# Patient Record
Sex: Female | Born: 1940 | Race: White | Hispanic: No | Smoking: Never smoker
Health system: Southern US, Community
[De-identification: ages and names within clinical notes are randomized; demographics above are authoritative.]

## PROBLEM LIST (undated history)

## (undated) DIAGNOSIS — K900 Celiac disease: Secondary | ICD-10-CM

## (undated) DIAGNOSIS — E785 Hyperlipidemia, unspecified: Secondary | ICD-10-CM

## (undated) DIAGNOSIS — M1990 Unspecified osteoarthritis, unspecified site: Secondary | ICD-10-CM

## (undated) DIAGNOSIS — Z9289 Personal history of other medical treatment: Secondary | ICD-10-CM

## (undated) DIAGNOSIS — D649 Anemia, unspecified: Secondary | ICD-10-CM

## (undated) DIAGNOSIS — T7840XA Allergy, unspecified, initial encounter: Secondary | ICD-10-CM

## (undated) DIAGNOSIS — B029 Zoster without complications: Secondary | ICD-10-CM

## (undated) DIAGNOSIS — E039 Hypothyroidism, unspecified: Secondary | ICD-10-CM

## (undated) DIAGNOSIS — L309 Dermatitis, unspecified: Secondary | ICD-10-CM

## (undated) DIAGNOSIS — I480 Paroxysmal atrial fibrillation: Secondary | ICD-10-CM

## (undated) DIAGNOSIS — G4733 Obstructive sleep apnea (adult) (pediatric): Secondary | ICD-10-CM

## (undated) DIAGNOSIS — H269 Unspecified cataract: Secondary | ICD-10-CM

## (undated) DIAGNOSIS — M8580 Other specified disorders of bone density and structure, unspecified site: Secondary | ICD-10-CM

## (undated) HISTORY — DX: Celiac disease: K90.0

## (undated) HISTORY — DX: Unspecified osteoarthritis, unspecified site: M19.90

## (undated) HISTORY — DX: Zoster without complications: B02.9

## (undated) HISTORY — DX: Unspecified cataract: H26.9

## (undated) HISTORY — DX: Dermatitis, unspecified: L30.9

## (undated) HISTORY — PX: UPPER GI ENDOSCOPY: SHX6162

## (undated) HISTORY — PX: COLONOSCOPY: SHX174

## (undated) HISTORY — PX: DILATION AND CURETTAGE OF UTERUS: SHX78

## (undated) HISTORY — DX: Anemia, unspecified: D64.9

## (undated) HISTORY — DX: Allergy, unspecified, initial encounter: T78.40XA

## (undated) HISTORY — DX: Hyperlipidemia, unspecified: E78.5

## (undated) HISTORY — DX: Other specified disorders of bone density and structure, unspecified site: M85.80

## (undated) HISTORY — DX: Personal history of other medical treatment: Z92.89

## (undated) HISTORY — DX: Hypothyroidism, unspecified: E03.9

## (undated) HISTORY — PX: CATARACT EXTRACTION: SUR2

---

## 2004-03-19 ENCOUNTER — Encounter: Payer: Self-pay | Admitting: Internal Medicine

## 2004-12-22 ENCOUNTER — Encounter: Admission: RE | Admit: 2004-12-22 | Discharge: 2004-12-22 | Payer: Self-pay | Admitting: Internal Medicine

## 2009-04-28 ENCOUNTER — Encounter (INDEPENDENT_AMBULATORY_CARE_PROVIDER_SITE_OTHER): Payer: Self-pay | Admitting: *Deleted

## 2009-05-18 ENCOUNTER — Ambulatory Visit: Payer: Self-pay | Admitting: Internal Medicine

## 2009-05-18 DIAGNOSIS — R143 Flatulence: Secondary | ICD-10-CM

## 2009-05-18 DIAGNOSIS — R141 Gas pain: Secondary | ICD-10-CM | POA: Insufficient documentation

## 2009-05-18 DIAGNOSIS — K5900 Constipation, unspecified: Secondary | ICD-10-CM | POA: Insufficient documentation

## 2009-05-18 DIAGNOSIS — R142 Eructation: Secondary | ICD-10-CM

## 2009-05-21 LAB — CONVERTED CEMR LAB: Tissue Transglutaminase Ab, IgA: 176 units — ABNORMAL HIGH (ref <7)

## 2009-05-22 ENCOUNTER — Ambulatory Visit: Payer: Self-pay | Admitting: Internal Medicine

## 2009-05-22 ENCOUNTER — Encounter (INDEPENDENT_AMBULATORY_CARE_PROVIDER_SITE_OTHER): Payer: Self-pay | Admitting: *Deleted

## 2009-05-28 ENCOUNTER — Ambulatory Visit: Payer: Self-pay | Admitting: Internal Medicine

## 2009-05-29 ENCOUNTER — Encounter: Payer: Self-pay | Admitting: Internal Medicine

## 2009-06-01 ENCOUNTER — Telehealth: Payer: Self-pay | Admitting: Internal Medicine

## 2009-06-04 ENCOUNTER — Telehealth: Payer: Self-pay | Admitting: Internal Medicine

## 2009-06-15 ENCOUNTER — Telehealth: Payer: Self-pay | Admitting: Internal Medicine

## 2009-06-16 ENCOUNTER — Telehealth (INDEPENDENT_AMBULATORY_CARE_PROVIDER_SITE_OTHER): Payer: Self-pay | Admitting: *Deleted

## 2009-06-29 ENCOUNTER — Ambulatory Visit: Payer: Self-pay | Admitting: Internal Medicine

## 2009-06-29 DIAGNOSIS — K900 Celiac disease: Secondary | ICD-10-CM | POA: Insufficient documentation

## 2009-08-11 ENCOUNTER — Encounter: Payer: Self-pay | Admitting: Internal Medicine

## 2009-08-11 ENCOUNTER — Encounter: Admission: RE | Admit: 2009-08-11 | Discharge: 2009-08-11 | Payer: Self-pay | Admitting: Internal Medicine

## 2009-12-30 ENCOUNTER — Ambulatory Visit: Payer: Self-pay | Admitting: Internal Medicine

## 2010-01-04 LAB — CONVERTED CEMR LAB: Tissue Transglutaminase Ab, IgA: 21.8 units — ABNORMAL HIGH (ref <20)

## 2010-03-03 ENCOUNTER — Encounter: Payer: Self-pay | Admitting: Nurse Practitioner

## 2010-03-15 ENCOUNTER — Encounter: Payer: Self-pay | Admitting: Nurse Practitioner

## 2010-03-17 ENCOUNTER — Encounter: Payer: Self-pay | Admitting: Nurse Practitioner

## 2010-03-17 ENCOUNTER — Encounter
Admission: RE | Admit: 2010-03-17 | Discharge: 2010-03-17 | Payer: Self-pay | Source: Home / Self Care | Attending: Internal Medicine | Admitting: Internal Medicine

## 2010-03-22 ENCOUNTER — Encounter: Payer: Self-pay | Admitting: Nurse Practitioner

## 2010-03-23 ENCOUNTER — Telehealth (INDEPENDENT_AMBULATORY_CARE_PROVIDER_SITE_OTHER): Payer: Self-pay | Admitting: *Deleted

## 2010-03-23 ENCOUNTER — Ambulatory Visit: Payer: Self-pay | Admitting: Gastroenterology

## 2010-03-23 DIAGNOSIS — R1031 Right lower quadrant pain: Secondary | ICD-10-CM | POA: Insufficient documentation

## 2010-03-23 LAB — CONVERTED CEMR LAB
BUN: 11 mg/dL (ref 6–23)
CO2: 30 meq/L (ref 19–32)
Calcium: 9.6 mg/dL (ref 8.4–10.5)
Chloride: 101 meq/L (ref 96–112)
Creatinine, Ser: 0.7 mg/dL (ref 0.4–1.2)
GFR calc non Af Amer: 89.42 mL/min (ref >60.00)
Glucose, Bld: 86 mg/dL (ref 70–99)
Potassium: 4.8 meq/L (ref 3.5–5.1)
Sodium: 137 meq/L (ref 135–145)

## 2010-03-24 ENCOUNTER — Ambulatory Visit: Payer: Self-pay | Admitting: Internal Medicine

## 2010-03-25 ENCOUNTER — Telehealth: Payer: Self-pay | Admitting: Gastroenterology

## 2010-04-15 ENCOUNTER — Emergency Department (HOSPITAL_COMMUNITY)
Admission: EM | Admit: 2010-04-15 | Discharge: 2010-04-15 | Payer: Self-pay | Source: Home / Self Care | Admitting: Emergency Medicine

## 2010-04-19 LAB — URINALYSIS, ROUTINE W REFLEX MICROSCOPIC
Bilirubin Urine: NEGATIVE
Hgb urine dipstick: NEGATIVE
Ketones, ur: NEGATIVE mg/dL
Nitrite: NEGATIVE
Protein, ur: NEGATIVE mg/dL
Specific Gravity, Urine: 1.008 (ref 1.005–1.030)
Urine Glucose, Fasting: NEGATIVE mg/dL
Urobilinogen, UA: 0.2 mg/dL (ref 0.0–1.0)
pH: 6 (ref 5.0–8.0)

## 2010-04-19 LAB — POCT I-STAT, CHEM 8
BUN: 12 mg/dL (ref 6–23)
Calcium, Ion: 1.17 mmol/L (ref 1.12–1.32)
Chloride: 102 mEq/L (ref 96–112)
Creatinine, Ser: 0.9 mg/dL (ref 0.4–1.2)
Glucose, Bld: 93 mg/dL (ref 70–99)
HCT: 41 % (ref 36.0–46.0)
Hemoglobin: 13.9 g/dL (ref 12.0–15.0)
Potassium: 4.1 mEq/L (ref 3.5–5.1)
Sodium: 135 mEq/L (ref 135–145)
TCO2: 25 mmol/L (ref 0–100)

## 2010-04-26 ENCOUNTER — Ambulatory Visit
Admission: RE | Admit: 2010-04-26 | Discharge: 2010-04-26 | Payer: Self-pay | Source: Home / Self Care | Attending: Internal Medicine | Admitting: Internal Medicine

## 2010-05-04 NOTE — Progress Notes (Signed)
Summary: biopsy results wanted  Phone Note Call from Patient Call back at Home Phone [phone]   Caller: Patient Call For: Dr. Henrene Pastor Reason for Call: Lab or Test Results Summary of Call: pt would like biopsy results  Initial call taken by: Lucien Mons,  June 01, 2009 8:47 AM  Follow-up for Phone Call        This note has been flagged for [facility] to call the paatient.  I do believe that the patient should have one nurse explain Sprue to her.  I will end this note so Malachy Mood can write her report.  I will also call the patient and tell her that she will get a call from one of the nurses downstairs in the very near future. Follow-up by: Ernestine Conrad RN,  June 01, 2009 11:32 AM

## 2010-05-04 NOTE — Procedures (Signed)
Summary: [facility]  [facility]   Imported By: Lester Fitzgerald 05/22/2009 10:53:48  _____________________________________________________________________  External Attachment:    Type:   Image     Comment:   External Document

## 2010-05-04 NOTE — Letter (Signed)
Summary: [facility]  [facility]   Imported By: Bubba Hales 08/24/2009 10:17:44  _____________________________________________________________________  External Attachment:    Type:   Image     Comment:   External Document

## 2010-05-04 NOTE — Letter (Signed)
Summary: EGD Instructions  [facility]  [address] [place], [place] [address]   Phone: [phone]  Fax: [phone]       Susan Bell    08/15/1940    MRN: [id number]       Procedure Day Dorna Bloom:  Lenor Coffin  05/28/09     Arrival Time:   8:00AM     Procedure Time:  9:00AM     Location of Procedure:                    _ X _  [facility]oscopy Center (4th Floor)   PREPARATION FOR ENDOSCOPY   On 05/28/09 THE DAY OF THE PROCEDURE:  1.   No solid foods, milk or milk products are allowed after midnight the night before your procedure.  2.   Do not drink anything colored red or purple.  Avoid juices with pulp.  No orange juice.  3.  You may drink clear liquids until 7:00AM, which is 2 hours before your procedure.                                                                                                CLEAR LIQUIDS INCLUDE: Water Jello Ice Popsicles Tea (sugar ok, no milk/cream) Powdered fruit flavored drinks Coffee (sugar ok, no milk/cream) Gatorade Juice: apple, white grape, white cranberry  Lemonade Clear bullion, consomm, broth Carbonated beverages (any kind) Strained chicken noodle soup Hard Candy   MEDICATION INSTRUCTIONS  Unless otherwise instructed, you should take regular prescription medications with a small sip of water as early as possible the morning of your procedure.  Diabetic patients - see separate instructions.  Stop taking Plavix or Aggrenox on  _  (7 days before procedure).     Stop taking Coumadin on  _  (5 days before procedure).  Additional medication instructions: _             OTHER INSTRUCTIONS  You will need a responsible adult at least 70 years of age to accompany you and drive you home.   This person must remain in the waiting room during your procedure.  Wear loose fitting clothing that is easily removed.  Leave jewelry and other valuables at home.  However, you may wish to bring a book to read or an  iPod/MP3 player to listen to music as you wait for your procedure to start.  Remove all body piercing jewelry and leave at home.  Total time from sign-in until discharge is approximately 2-3 hours.  You should go home directly after your procedure and rest.  You can resume normal activities the day after your procedure.  The day of your procedure you should not:   Drive   Make legal decisions   Operate machinery   Drink alcohol   Return to work  You will receive specific instructions about eating, activities and medications before you leave.    The above instructions have been reviewed and explained to me by   _______________________    I fully understand and can verbalize these instructions _____________________________ Date _________

## 2010-05-04 NOTE — Assessment & Plan Note (Signed)
Summary: flatus   History of Present Illness Visit Type: Initial Consult Primary GI MD: Scarlette Shorts MD Primary Provider: Shon Baton, MD Requesting Provider: Shon Baton, MD Chief Complaint: Gas, bloating for 6-9 months; constipation on going because of iron intake History of Present Illness:   70 year old white female with a history of hyperlipidemia, hypothyroidism, and osteoporosis. She is sent today with the chief complaint is increased intestinal gas as manifested principally by flatus. The problem has been going on for about 6-9 months. She states that it is getting worse. She cannot identify any factors that exacerbate or relieve the problem. Her GI review of systems is remarkable for chronic constipation which she treats with daily Metamucil and high-fiber foods. She has done this for years. She has tried a probiotic for a few weeks without improvement. She denies belching, abdominal pain, change in bowel habits, weight loss, or bleeding. Her medications are stable. She does bring with her a prior colonoscopy report from [place]. I have reviewed the report. She had a complete colonoscopy that was normal except for small internal hemorrhoids. She denies a family history of colon cancer or colon polyps. Review of outside laboratories from Dr. Keane Police office reveals negative Hemoccults April 28, 2009. Blood work from the week previous shows normal hemoglobin of 12.2.. She also requires about the timing of a followup colonoscopy.   GI Review of Systems    Reports bloating.      Denies abdominal pain, acid reflux, belching, chest pain, dysphagia with liquids, dysphagia with solids, heartburn, loss of appetite, nausea, vomiting, vomiting blood, weight loss, and  weight gain.      Reports constipation.     Denies anal fissure, black tarry stools, change in bowel habit, diarrhea, diverticulosis, fecal incontinence, heme positive stool, hemorrhoids, irritable bowel syndrome, jaundice, light color  stool, liver problems, rectal bleeding, and  rectal pain. Preventive Screening-Counseling & Management  Alcohol-Tobacco     Smoking Status: never      Drug Use:  no.      Current Medications (verified): 1)  Aspir-Low 81 Mg Tbec (Aspirin) .Marland Kitchen.. 1 By Mouth Three Days Per Week 2)  Calcium-Vitamin D 250-125 Mg-Unit Tabs (Calcium Carbonate-Vitamin D) .Marland Kitchen.. 1 By Mouth Once Daily 3)  Fosamax 70 Mg Tabs (Alendronate Sodium) .Marland Kitchen.. 1 Time Per Week 4)  Levothroid 88 Mcg Tabs (Levothyroxine Sodium) .Marland Kitchen.. 1 By Mouth Once Daily 5)  Lipitor 10 Mg Tabs (Atorvastatin Calcium) .... 1/2 By Mouth Once Daily 6)  Fish Oil 1000 Mg Caps (Omega-3 Fatty Acids) .... 2 By Mouth Once Daily 7)  Pro-Biotic Blend  Caps (Probiotic Product) .... Once Daily  Allergies (verified): No Known Drug Allergies  Past History:  Past Medical History: Reviewed history from 05/15/2009 and no changes required. Hypothyroidism Eczema Hyperlipidemia Osteopenia Vit D Deficiency Anemia Arthritis(osteo) Knee Shingles  Past Surgical History: Cataract Extraction (bilateral) D & C  Radiation Ablation Iodine 2005(pill)  Family History: Family History of Liver Cancer:Mother Family History of Diabetes: Father Family History of Heart Disease:Father   Social History: Occupation: Retired Patient has never smoked.  Alcohol Use - no Daily Caffeine Use -5 Illicit Drug Use - no Smoking Status:  never Drug Use:  no  Review of Systems  The patient denies allergy/sinus, anemia, anxiety-new, arthritis/joint pain, back pain, blood in urine, breast changes/lumps, change in vision, confusion, cough, coughing up blood, depression-new, fainting, fatigue, fever, headaches-new, hearing problems, heart murmur, heart rhythm changes, itching, menstrual pain, muscle pains/cramps, night sweats, nosebleeds, pregnancy symptoms, shortness of  breath, skin rash, sleeping problems, sore throat, swelling of feet/legs, swollen lymph glands, thirst -  excessive , urination - excessive , urination changes/pain, urine leakage, vision changes, and voice change.    Vital Signs:  Patient profile:   70 year old female Height:      65.5 inches Weight:      157.13 pounds BMI:     25.84 Pulse rate:   72 / minute Pulse rhythm:   regular BP sitting:   110 / 68  (left arm) Cuff size:   regular  Vitals Entered By: June McMurray [place] Deborra Medina) (May 18, 2009 10:17 AM)  Physical Exam  General:  Well developed, well nourished, no acute distress. Head:  Normocephalic and atraumatic. Eyes:  PERRLA, no icterus. Ears:  Normal auditory acuity. Nose:  No deformity, discharge,  or lesions. Mouth:  No deformity or lesions, dentition normal. Neck:  Supple; no masses or thyromegaly. Lungs:  Clear throughout to auscultation. Heart:  Regular rate and rhythm; no murmurs, rubs,  or bruits. Abdomen:  Soft, nontender and nondistended. No masses, hepatosplenomegaly or hernias noted. Normal bowel sounds. Msk:  Symmetrical with no gross deformities. Normal posture. Pulses:  Normal pulses noted. Extremities:  No clubbing, cyanosis, edema or deformities noted. Neurologic:  Alert and  oriented x4;  grossly normal neurologically. Skin:  Intact without significant lesions or rashes. Cervical Nodes:  No significant cervical adenopathy. No supraclavicular adenopathy Psych:  Alert and cooperative. Normal mood and affect.   Impression & Recommendations:  Problem # 1:  FLATULENCE-GAS-BLOATING (ICD-787.3) 6-9 month history of increased flatus. No accompanying alarm symptoms. Negative colonoscopy in 2005. We had a long discussion today on the etiology of intestinal gas.  Plan: #1. Stop Metamucil as this may increase intestinal gas #2. Obtain tissue transglutaminase antibody to screen for sprue #3. Empiric trial of broad-spectrum antibiotic for possible bacterial overgrowth. Metronidazole 250 mg q.i.d. x10 days prescribed #4. Brochure on intestinal gas reviewed and  provided per patient review #5. Brochure on anti-gas and flatulence dietary measures provided for her review. #6. Office followup in 6 weeks  Problem # 2:  CONSTIPATION (ICD-564.00) chronic stable functional constipation.  Plan: #1. Prescribe MiraLax. Titrat to need.  Problem # 3:  SCREENING COLORECTAL-CANCER (ICD-V76.51) index exam in 2005 was normal. Based on current guidelines (which I reviewed with her), routine followup would be recommended 2015. Colonoscopy recall to be made for that time.  Other Orders: T-Tissue Transglutamase Ab IgA [phone])  Patient Instructions: 1)  Flagyl 250 mg 1 by mouth qid x 10 days Rx. sent eletronically to patient's pharmacy for patient to pick up. 2)  TTG ordered for patient to go to basement and have drawn today. 3)  Please schedule a follow-up appointment in 6  weeks.  4)  Gas brochure and prevention diet given to patient. 5)  Stop Metamucil and start Miralax dailty for constipation.  17 grams in 8 oz water daily.   6)  The medication list was reviewed and reconciled.  All changed / newly prescribed medications were explained.  A complete medication list was provided to the patient / caregiver. 7)  Copy: Dr. Shon Baton Prescriptions: METRONIDAZOLE 250 MG TABS (METRONIDAZOLE) 1 tablet by mouth qid x 10 days.  #40 x 0   Entered by:   Randye Lobo NCMA   Authorized by:   Irene Shipper MD   Signed by:   Randye Lobo NCMA on 05/18/2009   Method used:   Electronically to  [facility]  [phone]* (retail)       [address]       [place], [place]  [address]       Ph: [phone] or [phone]       Fax: [phone]   RxID:   [id number]

## 2010-05-04 NOTE — Letter (Signed)
Summary: New Patient letter  [facility]  [address] [place], [place] [address]   Phone: [phone]  Fax: [phone]       04/28/2009 MRN: [id number]  [name] [address] [place], [place]  [address]  Dear Susan Bell,  Welcome to the Gastroenterology Division at [facility].    You are scheduled to see Dr.  Yancey Flemings on June 01, 2009 at 9:15am on the 3rd floor at [facility], [address]  We ask that you try to arrive at our office 15 minutes prior to your appointment time to allow for check-in.  We would like you to complete the enclosed self-administered evaluation form prior to your visit and bring it with you on the day of your appointment.  We will review it with you.  Also, please bring a complete list of all your medications or, if you prefer, bring the medication bottles and we will list them.  Please bring your insurance card so that we may make a copy of it.  If your insurance requires a referral to see a specialist, please bring your referral form from your primary care physician.  Co-payments are due at the time of your visit and may be paid by cash, check or credit card.     Your office visit will consist of a consult with your physician (includes a physical exam), any laboratory testing he/she may order, scheduling of any necessary diagnostic testing (e.g. x-ray, ultrasound, CT-scan), and scheduling of a procedure (e.g. Endoscopy, Colonoscopy) if required.  Please allow enough time on your schedule to allow for any/all of these possibilities.    If you cannot keep your appointment, please call [phone] to cancel or reschedule prior to your appointment date.  This allows [place] the opportunity to schedule an appointment for another patient in need of care.  If you do not cancel or reschedule by 5 p.m. the business day prior to your appointment date, you will be charged a $50.00 late cancellation/no-show fee.    Thank you  for choosing [facility] for your medical needs.  We appreciate the opportunity to care for you.  Please visit [place] at our website  to learn more about our practice.                     Sincerely,                                                             The Gastroenterology Division

## 2010-05-04 NOTE — Progress Notes (Signed)
Summary: update  Phone Note Call from Patient Call back at Home Phone [phone]   Caller: Patient Call For: Dr. Marina Goodell Reason for Call: Talk to Nurse Summary of Call: pt reporting that she does not have an appt with the nutritionist yet Initial call taken by: Vallarie Mare,  June 15, 2009 2:51 PM  Follow-up for Phone Call          Out-pt.nutrition dept. contacted and they will call her now as they said she did not answer previously. Follow-up by: Teryl Lucy RN,  June 15, 2009 2:58 PM

## 2010-05-04 NOTE — Assessment & Plan Note (Signed)
Summary: F/U APPT - new diagnosis Celiac Sprue   History of Present Illness Visit Type: Follow-up Visit Primary GI MD: Yancey Flemings MD Primary Provider: Creola Corn, MD Requesting Provider: n/a Chief Complaint: F/u from endo. Pt denies any GI complaints  History of Present Illness:   70 year old female with hypertension, hyperlipidemia, and osteoporosis. She was evaluated about 6 weeks ago with a chief complaint of increased flatus. See that dictation for details. Tissue transglutaminase antibody returned positive. On May 28, 2009 she underwent upper endoscopy. She was found to have esophageal ring and atrophic gastric mucosa. Duodenal biopsies were obtained and revealed partial villous atrophy and chronic inflammation consistent with celiac sprue. We discussed these results by telephone. She has been avoiding gluten to the best of her ability. Her GI symptoms have improved.her only ongoing complaint is that of chronic constipation for which he uses MiraLax p.r.n.   GI Review of Systems      Denies abdominal pain, acid reflux, belching, bloating, chest pain, dysphagia with liquids, dysphagia with solids, heartburn, loss of appetite, nausea, vomiting, vomiting blood, weight loss, and  weight gain.      Reports constipation.     Denies anal fissure, black tarry stools, change in bowel habit, diarrhea, diverticulosis, fecal incontinence, heme positive stool, hemorrhoids, irritable bowel syndrome, jaundice, light color stool, liver problems, rectal bleeding, and  rectal pain.    Current Medications (verified): 1)  Aspir-Low 81 Mg Tbec (Aspirin) .Marland Kitchen.. 1 By Mouth Three Days Per Week 2)  Calcium-Vitamin D 600-125 Mg-Unit Tabs (Calcium-Vitamin D) .... One Tablet By Mouth Once Daily 3)  Fosamax 70 Mg Tabs (Alendronate Sodium) .Marland Kitchen.. 1 Time Per Week 4)  Levothroid 88 Mcg Tabs (Levothyroxine Sodium) .Marland Kitchen.. 1 By Mouth Once Daily 5)  Lipitor 10 Mg Tabs (Atorvastatin Calcium) .... 1/2 By Mouth Once  Daily 6)  Fish Oil 1000 Mg Caps (Omega-3 Fatty Acids) .... 2 By Mouth Once Daily  Allergies (verified): No Known Drug Allergies  Past History:  Past Medical History: Hypothyroidism Eczema Hyperlipidemia Osteopenia Vit D Deficiency Anemia Arthritis(osteo) Knee Shingles Celiac Disease   Past Surgical History: Reviewed history from 05/18/2009 and no changes required. Cataract Extraction (bilateral) D & C  Radiation Ablation Iodine 2005(pill)  Family History: Family History of Liver Cancer:Mother Family History of Diabetes: Father Family History of Heart Disease:Father  No FH of Colon Cancer:  Social History: Reviewed history from 05/18/2009 and no changes required. Occupation: Retired Patient has never smoked.  Alcohol Use - no Daily Caffeine Use -5 Illicit Drug Use - no  Review of Systems  The patient denies allergy/sinus, anemia, anxiety-new, arthritis/joint pain, back pain, blood in urine, breast changes/lumps, change in vision, confusion, cough, coughing up blood, depression-new, fainting, fatigue, fever, headaches-new, hearing problems, heart murmur, heart rhythm changes, itching, menstrual pain, muscle pains/cramps, night sweats, nosebleeds, pregnancy symptoms, shortness of breath, skin rash, sleeping problems, sore throat, swelling of feet/legs, swollen lymph glands, thirst - excessive , urination - excessive , urination changes/pain, urine leakage, vision changes, and voice change.    Vital Signs:  Patient profile:   70 year old female Height:      66.5 inches Weight:      155 pounds BMI:     24.73 BSA:     1.81 Pulse rate:   68 / minute Pulse rhythm:   regular BP sitting:   110 / 60  (left arm) Cuff size:   regular  Vitals Entered By: Ok Anis CMA (June 29, 2009 10:11 AM)  Physical Exam  General:  Well developed, well nourished, no acute distress. Eyes:  anicteric Abdomen:  not reexamined Neurologic:  Alert and  oriented x4;   Psych:  Alert and  cooperative. Normal mood and affect.   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 We discussed the pathophysiology of the condition as well as potential complications without proper therapy. Therapy, of course, is gluten avoidance. She had multiple questions which were answered to her satisfaction.  Plan #1. Referred to nutritional specialist regarding gluten free diet. Appointment in May #2. Refer to educational website #3. Followup tissue transglutaminase antibody in 6 months #4. Routine GI followup in one year  Problem # 2:  CONSTIPATION (ICD-564.00) MiraLax p.r.n.  Problem # 3:  SCREENING COLORECTAL-CANCER (ICD-V76.51) followup screening in 2015. Recall made.  Patient Instructions: 1)  TTG Ab placed in IDX for patient to have drawn in 6 months.  2)  I will call and remind you 1 week prior that it is time. 3)  Please schedule a follow-up appointment in 1 year. 4)  The medication list was reviewed and reconciled.  All changed / newly prescribed medications were explained.  A complete medication list was provided to the patient / caregiver. 5)  copy printed and given to patient.  Milford Cage [facility]  June 29, 2009 10:47 AM 6)  Copy: Dr. Creola Corn

## 2010-05-04 NOTE — Progress Notes (Signed)
  Phone Note Outgoing Call   Summary of Call:  Checked with pt. and nutrtion appt. made for 08/11/2009. Initial call taken by: Teryl Lucy RN,  June 16, 2009 8:51 AM

## 2010-05-04 NOTE — Procedures (Signed)
Summary: Upper Endoscopy  Patient: Susan Bell Note: All result statuses are Final unless otherwise noted.  Tests: (1) Upper Endoscopy (EGD)   EGD Upper Endoscopy       DONE     [facility]     [address]     [place], [place]  [address]           ENDOSCOPY PROCEDURE REPORT           PATIENT:  Susan, Bell  MR#:  [id number]     BIRTHDATE:  January 05, 1941, 69 yrs. old  GENDER:  female           ENDOSCOPIST:  Wilhemina Bonito. Eda Keys, MD     Referred by:  Creola Corn, M.D.           PROCEDURE DATE:  05/28/2009     PROCEDURE:  EGD with biopsy     ASA CLASS:  Class II     INDICATIONS:  small bowel biopsy to rule out celiac sprue     (positive TTG ab)           MEDICATIONS:   Fentanyl 75 mcg IV, Versed 4 mg IV     TOPICAL ANESTHETIC:  Exactacain Spray           DESCRIPTION OF PROCEDURE:   After the risks benefits and     alternatives of the procedure were thoroughly explained, informed     consent was obtained.  The LB [id number] endoscope was     introduced through the mouth and advanced to the second portion of     the duodenum, without limitations.  The instrument was slowly     withdrawn as the mucosa was fully examined.     <<PROCEDUREIMAGES>>           An incidental large caliber esophageal ring was found at the GEJ.     Atrophic gastric mucosa throughout the stomach.  The bulb was     normal. The second portion of the duodenum revealed no gross     abnormality. Mult bx D2/D3 taken. Retroflexed views revealed no     abnormalities.    The scope was then withdrawn from the patient     and the procedure completed.           COMPLICATIONS:  None           ENDOSCOPIC IMPRESSION:     1) Ring, esophageal     2) Atrophic gastric mucosa     RECOMMENDATIONS:     1) Await pathology results           REPEAT EXAM:  No           ______________________________     Wilhemina Bonito. Eda Keys, MD           CC:  Creola Corn, MD, The Patient           n.     eSIGNED:   Wilhemina Bonito. Eda Keys at 05/28/2009 10:08 AM           Rometta Emery, [id number]  Note: An exclamation mark (!) indicates a result that was not dispersed into the flowsheet. Document Creation Date: 05/28/2009 10:36 AM _______________________________________________________________________  (1) Order result status: Final Collection or observation date-time: 05/28/2009 09:55 Requested date-time:  Receipt date-time:  Reported date-time:  Referring Physician:   Ordering Physician: Fransico Setters [phone]) Specimen Source:  Source: Launa Grill Order Number: [id number] Lab site:

## 2010-05-04 NOTE — Miscellaneous (Signed)
Summary: [facility] Previsit/prep  Clinical Lists Changes  Observations: Added new observation of NKA: T (05/22/2009 13:23)

## 2010-05-04 NOTE — Letter (Signed)
Summary: Patient Notice-Endo Biopsy Results  [facility]  [address] [place], [place] [address]   Phone: [phone]  Fax: [phone]        May 29, 2009 MRN: [id number]    Susan Bell [address] [place], [place]  [address]    Dear [name],  The biopsies taken during your recent endoscopic examination did show histologic changes consistent with CELIAC SPRUE.  Additional information/recommendations:  1. MY NURSE WILL ARRANGE FOR YOU TO SEE A NUTRITIONIST REGARDING A GLUTEN FREE DIET  2. PLEASE VISIT OUR GI WEB SITE [email or web address] UNDER COMMON CODITIONS AND CLICK ON CELIAC SPRUE AS A REFERENCE  3. KEEP YOUR ALREADY SCHEDULED APPOINTMENT WITH ME FOR NEXT MONTH   Please call us if you are having persistent problems or have questions about your condition that have not been fully answered at this time.  Sincerely,  Hilarie Fredrickson MD  This letter has been electronically signed by your physician.  Appended Document: Patient Notice-Endo Biopsy Results Letter mailed 3.1.11

## 2010-05-04 NOTE — Progress Notes (Signed)
Summary: Triage  Phone Note Call from Patient Call back at Home Phone [phone]   Caller: Patient Call For: Dr. Marina Goodell Reason for Call: Talk to Nurse Summary of Call: Pt is calling about the appt. for a nutritionist Initial call taken by: Karna Christmas,  June 04, 2009 9:57 AM  Follow-up for Phone Call         Pt. informed that referral was sent on this past Monday when I talked with her and it usually takes several days but she will be contacted as soon as we are ntfd. of appt. date. Follow-up by: Teryl Lucy RN,  June 04, 2009 10:40 AM

## 2010-05-06 NOTE — Letter (Signed)
Summary: Phone Note/[facility]  Phone Note/[facility]   Imported By: Sherian Rein 04/08/2010 15:11:51  _____________________________________________________________________  External Attachment:    Type:   Image     Comment:   External Document

## 2010-05-06 NOTE — Assessment & Plan Note (Signed)
Summary: F/U RLQ pain, Review CT scan (saw Lafe Garin ACNP)   History of Present Illness Visit Type: Follow-up Visit Primary GI MD: Yancey Flemings MD Primary Provider: Creola Corn, MD Requesting Provider: n/a Chief Complaint: RLQ pain, review CT findings History of Present Illness:   70 year old female with hypothyroidism, hyperlipidemia, and celiac sprue. She was seen in the office by our extender March 23, 2010 regarding sharp intermittent right lower quadrant pain. She has had this problem for over 6 months. Abdominal ultrasound and CT scan of the abdomen were unremarkable. Increased stool burden on CT, the patient has normal bowel movements twice daily. Empiric MiraLax to increased frequency of bowel movements did not change pain. She continues to describe sharp pain in the region of the right hip anteriorly without radiation. More noticeable at night when lying on the left or right side. Improved with lying on her back. Sometimes notices this during the day. Does not inhibit activities. No associated GI symptoms. She is accompanied by her husband.   GI Review of Systems    Reports abdominal pain.     Location of  Abdominal pain: RLQ.    Denies acid reflux, belching, bloating, chest pain, dysphagia with liquids, dysphagia with solids, heartburn, loss of appetite, nausea, vomiting, vomiting blood, weight loss, and  weight gain.        Denies anal fissure, black tarry stools, change in bowel habit, constipation, diarrhea, diverticulosis, fecal incontinence, heme positive stool, hemorrhoids, irritable bowel syndrome, jaundice, light color stool, liver problems, rectal bleeding, and  rectal pain.    Current Medications (verified): 1)  Calcium-Vitamin D 600-125 Mg-Unit Tabs (Calcium-Vitamin D) .... One Tablet By Mouth Once Daily 2)  Levothroid 88 Mcg Tabs (Levothyroxine Sodium) .Marland Kitchen.. 1 By Mouth Once Daily 3)  Fish Oil 1000 Mg Caps (Omega-3 Fatty Acids) .... 2 By Mouth Once Daily 4)  Meclizine  Hcl 25 Mg Tabs (Meclizine Hcl) .... Take 1 Tablet By Mouth Every 6 Hours For Vertigo  Allergies (verified): No Known Drug Allergies  Past History:  Past Medical History: Reviewed history from 06/29/2009 and no changes required. Hypothyroidism Eczema Hyperlipidemia Osteopenia Vit D Deficiency Anemia Arthritis(osteo) Knee Shingles Celiac Disease   Past Surgical History: Reviewed history from 05/18/2009 and no changes required. Cataract Extraction (bilateral) D & C  Radiation Ablation Iodine 2005(pill)  Family History: Reviewed history from 06/29/2009 and no changes required. Family History of Liver Cancer:Mother Family History of Diabetes: Father Family History of Heart Disease:Father  No FH of Colon Cancer:  Social History: Reviewed history from 05/18/2009 and no changes required. Occupation: Retired Patient has never smoked.  Alcohol Use - no Daily Caffeine Use -5 Illicit Drug Use - no  Review of Systems  The patient denies allergy/sinus, anemia, anxiety-new, arthritis/joint pain, back pain, blood in urine, breast changes/lumps, change in vision, confusion, cough, coughing up blood, depression-new, fainting, fatigue, fever, headaches-new, hearing problems, heart murmur, heart rhythm changes, itching, menstrual pain, muscle pains/cramps, night sweats, nosebleeds, pregnancy symptoms, shortness of breath, skin rash, sleeping problems, sore throat, swelling of feet/legs, swollen lymph glands, thirst - excessive , urination - excessive , urination changes/pain, urine leakage, vision changes, and voice change.    Vital Signs:  Patient profile:   70 year old female Height:      66.5 inches Weight:      155.50 pounds BMI:     24.81 Pulse rate:   68 / minute BP sitting:   134 / 72  (left arm) Cuff size:  regular  Vitals Entered By: June McMurray CMA Duncan Dull) (April 26, 2010 10:31 AM)  Physical Exam  General:  Well developed, well nourished, no acute distress. Head:   Normocephalic and atraumatic. Eyes:  PERRLA, no icterus. Lungs:  Clear throughout to auscultation. Heart:  Regular rate and rhythm; no murmurs, rubs,  or bruits. Abdomen:  Soft, nontender and nondistended. No masses, hepatosplenomegaly or hernias noted. Normal bowel sounds. There is point tenderness or soreness on the right hip bone anteriorly. No mass. No such discomfort on the left. Msk:  Symmetrical with no gross deformities. Normal posture. Pulses:  Normal pulses noted. Extremities:  no edema Neurologic:  alert and oriented Skin:  no jaundice Psych:  Alert and cooperative. Normal mood and affect.   Impression & Recommendations:  Problem # 1:  ABDOMINAL PAIN-RLQ (ICD-789.03) right lower quadrant discomfort musculoskeletal nature. It seems to have soreness on the hip bone. I find no evidence for a primary GI cause for her pain..  Plan: #1. Advil 200 mg t.i.d. for 2 weeks #2. She is seeing Dr. Timothy Lasso this week. Should discomfort persists he may wish to assess further. I will leave this to his discretion.  Problem # 2:  CELIAC DISEASE (ICD-579.0) doing well on gluten-free diet. Continue gluten-free diet  Problem # 3:  SCREENING COLORECTAL-CANCER (ICD-V76.51) normal routine colonoscopy in 2005. Repeat followup 2015  Patient Instructions: 1)  Please schedule a follow-up appointment as needed.  2)  Copy sent to : Creola Corn, MD 3)  The medication list was reviewed and reconciled.  All changed / newly prescribed medications were explained.  A complete medication list was provided to the patient / caregiver.

## 2010-05-06 NOTE — Progress Notes (Signed)
Summary: Results  Phone Note Call from Patient Call back at Home Phone [phone]   Caller: Patient Call For: Dr. Jarold Motto Reason for Call: Talk to Nurse Summary of Call: Pt wants to know result of CT scan because she is going out of town today at noon Initial call taken by: [place] Johnson,  March 25, 2010 8:37 AM  Follow-up for Phone Call        Patient notified of radiology results She is asked to keep her appointment for Dr Marina Goodell and stay on Miralax. Follow-up by: Darcey Nora RN, CGRN,  March 25, 2010 9:38 AM

## 2010-05-06 NOTE — Letter (Signed)
Summary: [facility]  [facility]   Imported By: Sherian Rein 04/08/2010 15:10:46  _____________________________________________________________________  External Attachment:    Type:   Image     Comment:   External Document

## 2010-05-06 NOTE — Assessment & Plan Note (Signed)
Summary: abd. pain   History of Present Illness Primary GI MD: Scarlette Shorts MD Primary Emarion Toral: Shon Baton, MD Requesting Obert Espindola: n/a Chief Complaint: Intermittant RLQ intermittant sharp abd pains that are described as a "twinge". Pt states she had a ultrasound that was negative. Pt denies any change in bowels.  History of Present Illness:   Patient is a 70 year old female diagnosed with celiac disease by Dr. Henrene Pastor in March 2011. She is here today for RLQ abdominal pain. Not exactly pain, more of a discomfort  but it does wake her up at night. She doesn't notice the discomfort wihile busy during the day but rather when sitting still. The discomfort started several months ago, episodes were much less infrequent back then. She now has the discomfort 2-4 times a week. Episodes last 10-15 minutes, usually about the time it takes her to find a comfortable position or fall back asleep. No associated bowel change, nausea, weight loss.  No urinary discomfort or vaginal discharge. No abdominal surgeries to suspect adhesions. U/S, CBC normal. KUB showed copius amounts of stool in ascending and transverse colon. Six days ago started Miralax and has had several bowel movements since then. The discomfort has not improved, in fact, three nights ago she had her most painful episode ever.   GI Review of Systems    Reports abdominal pain.     Location of  Abdominal pain: RLQ.    Denies acid reflux, belching, bloating, chest pain, dysphagia with liquids, dysphagia with solids, heartburn, loss of appetite, nausea, vomiting, vomiting blood, weight loss, and  weight gain.        Denies anal fissure, black tarry stools, change in bowel habit, constipation, diarrhea, diverticulosis, fecal incontinence, heme positive stool, hemorrhoids, irritable bowel syndrome, jaundice, light color stool, liver problems, rectal bleeding, and  rectal pain.    Current Medications (verified): 1)  Calcium-Vitamin D 600-125 Mg-Unit Tabs  (Calcium-Vitamin D) .... One Tablet By Mouth Once Daily 2)  Levothroid 88 Mcg Tabs (Levothyroxine Sodium) .Marland Kitchen.. 1 By Mouth Once Daily 3)  Fish Oil 1000 Mg Caps (Omega-3 Fatty Acids) .... 2 By Mouth Once Daily  Allergies (verified): No Known Drug Allergies  Past History:  Past Medical History: Reviewed history from 06/29/2009 and no changes required. Hypothyroidism Eczema Hyperlipidemia Osteopenia Vit D Deficiency Anemia Arthritis(osteo) Knee Shingles Celiac Disease   Past Surgical History: Reviewed history from 05/18/2009 and no changes required. Cataract Extraction (bilateral) D & C  Radiation Ablation Iodine 2005(pill)  Family History: Reviewed history from 06/29/2009 and no changes required. Family History of Liver Cancer:Mother Family History of Diabetes: Father Family History of Heart Disease:Father  No FH of Colon Cancer:  Social History: Reviewed history from 05/18/2009 and no changes required. Occupation: Retired Patient has never smoked.  Alcohol Use - no Daily Caffeine Use -5 Illicit Drug Use - no  Review of Systems  The patient denies allergy/sinus, anemia, anxiety-new, arthritis/joint pain, back pain, blood in urine, breast changes/lumps, change in vision, confusion, cough, coughing up blood, depression-new, fainting, fatigue, fever, headaches-new, hearing problems, heart murmur, heart rhythm changes, itching, menstrual pain, muscle pains/cramps, night sweats, nosebleeds, pregnancy symptoms, shortness of breath, skin rash, sleeping problems, sore throat, swelling of feet/legs, swollen lymph glands, thirst - excessive , urination - excessive , urination changes/pain, urine leakage, vision changes, and voice change.    Vital Signs:  Patient profile:   70 year old female Height:      66.5 inches Weight:      155.25  pounds BMI:     24.77 Pulse rate:   70 / minute Pulse rhythm:   regular BP sitting:   142 / 76  (left arm) Cuff size:   regular  Vitals  Entered By: Marlon Pel CMA Deborra Medina) (March 23, 2010 2:33 PM)  Physical Exam  General:  Well developed, well nourished, no acute distress. Head:  Normocephalic and atraumatic. Eyes:  Conjunctiva pink, no icterus.  Neck:  no obvious masses  Lungs:  Clear throughout to auscultation. Heart:  Regular rate and rhythm; no murmurs, rubs,  or bruits. Abdomen:  Abdomen soft, nondistended with bowel sounds.There is mild RLQ tenderness. No palpable masses. Msk:  Symmetrical with no gross deformities. Normal posture. Extremities:  No palmar erythema, no edema.  Neurologic:  Alert and  oriented x4;  grossly normal neurologically. Skin:  Intact without significant lesions or rashes. Cervical Nodes:  No significant cervical adenopathy. Psych:  Alert and cooperative. Normal mood and affect.   Impression & Recommendations:  Problem # 1:  ABDOMINAL PAIN-RLQ (ICD-789.03) Assessment Deteriorated Several month history of progressive RLQ discomfort not related to meals or defection.. Etiology of pain not clear. CBC, U/S of abdomen were normal. KUB compatible with constipation but patient has since started Miralax with good results but no improvment in RLQ discomfort. For further workup will obtain CTscan of abdomen and pelvis if renal function is normal.   She will be given a follow appt. with Dr. Henrene Pastor but in the meantime, she will be called with test results and any further recommendations based on those results. Patient will try Motrin if needed for pain, she doesn't want anything stronger.  Orders: CT Abdomen/Pelvis with Contrast (CT Abd/Pelvis w/con) TLB-BMP (Basic Metabolic Panel-BMET) ([id number])  Problem # 2:  CELIAC DISEASE (ICD-579.0) Assessment: Unchanged Diagnosed March 2011  Problem # 3:  CONSTIPATION (ICD-564.00) Assessment: Comment Only KUB several days ago compatible with constipation which patient finds surprising.  She has now started Miralax which seems to be working.  Recommend she continue Miralax once daily, may take twice daily if needed. Of note that patient had normal screening colonosco[y in [place] in 2005.  Patient Instructions: 1)  Please go to lab, basement level. 2)  We scheduled the CT scan at [place] [address] in the [employer].  3)  Directions provided. 4)  We made you a follow up appointment with Dr. Henrene Pastor on 04-26-2009. 5)  Appointment card provided. 6)  We will call you with the results. 7)  Copy sent to : Shon Baton, MD 8)  The medication list was reviewed and reconciled.  All changed / newly prescribed medications were explained.  A complete medication list was provided to the patient / caregiver.

## 2010-05-06 NOTE — Progress Notes (Signed)
Summary: abd. pain   Phone Note From Other Clinic   Summary of Call: Dr.Russo req. pt. be seen for abd. pain.Given appt. with N.P. for this afternoon. Initial call taken by: Teryl Lucy RN,  March 23, 2010 9:02 AM

## 2010-07-05 ENCOUNTER — Other Ambulatory Visit: Payer: Self-pay

## 2010-07-05 ENCOUNTER — Other Ambulatory Visit: Payer: Self-pay | Admitting: Internal Medicine

## 2010-07-06 ENCOUNTER — Encounter: Payer: Self-pay | Admitting: *Deleted

## 2010-07-12 ENCOUNTER — Telehealth: Payer: Self-pay | Admitting: Internal Medicine

## 2010-07-12 DIAGNOSIS — K900 Celiac disease: Secondary | ICD-10-CM

## 2010-07-12 NOTE — Telephone Encounter (Signed)
Pt instructed to come Wed for her labs, orders entered for TTG/AB, the labs she missed on 07/05/10.

## 2010-07-14 ENCOUNTER — Other Ambulatory Visit: Payer: Self-pay | Admitting: Internal Medicine

## 2010-07-14 ENCOUNTER — Other Ambulatory Visit: Payer: Self-pay

## 2010-07-14 DIAGNOSIS — K900 Celiac disease: Secondary | ICD-10-CM

## 2010-07-15 LAB — TISSUE TRANSGLUTAMINASE, IGA: Tissue Transglutaminase Ab, IgA: 12.6 U/mL (ref <20)

## 2010-08-03 ENCOUNTER — Encounter: Payer: Self-pay | Admitting: Internal Medicine

## 2010-08-03 ENCOUNTER — Ambulatory Visit (INDEPENDENT_AMBULATORY_CARE_PROVIDER_SITE_OTHER): Payer: BC Managed Care – PPO | Admitting: Internal Medicine

## 2010-08-03 VITALS — BP 120/62 | HR 76 | Ht 70.0 in | Wt 158.0 lb

## 2010-08-03 DIAGNOSIS — R142 Eructation: Secondary | ICD-10-CM

## 2010-08-03 DIAGNOSIS — M25551 Pain in right hip: Secondary | ICD-10-CM

## 2010-08-03 DIAGNOSIS — M25559 Pain in unspecified hip: Secondary | ICD-10-CM

## 2010-08-03 DIAGNOSIS — R141 Gas pain: Secondary | ICD-10-CM

## 2010-08-03 DIAGNOSIS — R140 Abdominal distension (gaseous): Secondary | ICD-10-CM

## 2010-08-03 DIAGNOSIS — K900 Celiac disease: Secondary | ICD-10-CM

## 2010-08-03 MED ORDER — ALIGN PO CAPS: 1.0000 | ORAL_CAPSULE | Freq: Every day | ORAL | Status: AC

## 2010-08-03 NOTE — Patient Instructions (Addendum)
We have given you samples of Align. This puts good bacteria back into your intestines. You should take 1 capsule by mouth once daily. We have given you a copy of your Celiac Panel as per your request. Please follow up with Dr Marina Goodell in 1 year.

## 2010-08-03 NOTE — Progress Notes (Signed)
HISTORY OF PRESENT ILLNESS:  Susan Bell is a 70 y.o. female who is followed in this office for celiac sprue. Other medical problems as listed below. Last evaluated January 23rd 2012. Her chief complaint is that time was right lower quadrant discomfort which was located to the hip bone. She was treated with Advil for 2 weeks. She tells me her pain resolved. She has had recurrent pain in recent weeks. Anti-inflammatory still seem to help. She is concerned that it is something serious. Previously advised that she see Dr. Timothy Lasso for this pain. She also recently underwent her blood work including tissue transglutaminase antibody. Now normal at 12.6. Minimal symptoms of bloating. She requires about probiotic. GI review of systems is otherwise negative. Next colonoscopy due in 2015.  REVIEW OF SYSTEMS:  All non-GI ROS negative.  Past Medical History  Diagnosis Date  . Hypothyroidism   . Eczema   . Hyperlipidemia   . Osteopenia   . Vitamin D deficiency   . Anemia   . Arthritis   . Shingles   . Celiac disease     Past Surgical History  Procedure Date  . Cataract extraction     bilateral  . Dilation and curettage of uterus     Social History Susan Bell  reports that she has never smoked. She does not have any smokeless tobacco history on file. She reports that she does not drink alcohol or use illicit drugs.  family history includes Diabetes in her father; Heart disease in her father; and Liver cancer in her mother.  There is no history of Colon cancer.  No Known Allergies     PHYSICAL EXAMINATION:  Vital signs: BP 120/62  Pulse 76  Ht 5\' 10"  (1.778 m)  Wt 158 lb (71.668 kg)  BMI 22.67 kg/m2 General: Well-developed, well-nourished, no acute distress HEENT: Sclerae are anicteric, conjunctiva pink. Oral mucosa intact Lungs: Clear Heart: Regular Abdomen: soft, nontender, nondistended, no obvious ascites, no peritoneal signs, normal bowel sounds. No organomegaly. There  is tenderness located on the anterior portion of the pelvic bone superiorly. This is similar to prior physical exam finding. Extremities: No edema Psychiatric: alert and oriented x3. Cooperative    ASSESSMENT:  #1. Recurrent pain on the right superior anterior iliac crest. Improved with NSAIDs #2. Celiac sprue. Normal TTG antibodies supporting dietary compliance #3. Surveillance colonoscopy due 2015   PLAN: #1. When necessary NSAIDs for musculoskeletal discomfort. Again, if this persists, I have encouraged her to followup with Dr. Timothy Lasso #2. Continue gluten-free diet #3. Trial of probiotic Align to see if this helps with gas. Samples given. Confirm no gluten. #4. Routine GI followup in one year #5. Surveillance colonoscopy 2015

## 2011-09-09 ENCOUNTER — Telehealth: Payer: Self-pay | Admitting: Internal Medicine

## 2011-09-09 NOTE — Telephone Encounter (Signed)
Discussed with pt that there were no orders for labs. Pt will come for OV and if Dr. Henrene Pastor wants her to have labs he will send her to the lab after the [place]. Pt aware.

## 2011-09-13 ENCOUNTER — Encounter: Payer: Self-pay | Admitting: Internal Medicine

## 2011-09-13 ENCOUNTER — Ambulatory Visit (INDEPENDENT_AMBULATORY_CARE_PROVIDER_SITE_OTHER): Payer: BC Managed Care – PPO | Admitting: Internal Medicine

## 2011-09-13 VITALS — BP 116/58 | HR 64 | Ht 67.0 in | Wt 156.0 lb

## 2011-09-13 DIAGNOSIS — K900 Celiac disease: Secondary | ICD-10-CM

## 2011-09-13 DIAGNOSIS — Z1211 Encounter for screening for malignant neoplasm of colon: Secondary | ICD-10-CM

## 2011-09-13 DIAGNOSIS — R141 Gas pain: Secondary | ICD-10-CM

## 2011-09-13 DIAGNOSIS — R142 Eructation: Secondary | ICD-10-CM

## 2011-09-13 NOTE — Progress Notes (Signed)
HISTORY OF PRESENT ILLNESS:  Susan Bell is a 71 y.o. female who is followed in this office for celiac sprue, diagnosed by serology and biopsy in February 2011. Other medical problems as listed below. Last evaluated 08/03/2010. At that time she was asymptomatic with dietary compliance. As well, normalized serology. She did have some complaints of increased intestinal gas for which she is taken probiotic Align. She states this has helped. She is put off by the cost, somewhat. Currently, her GI review of systems is negative. In addition to the aforementioned upper endoscopy, she has had prior colonoscopy. She is due for repeat screening in 2015.  REVIEW OF SYSTEMS:  All non-GI ROS negative except for joint aches.  Past Medical History  Diagnosis Date  . Hypothyroidism   . Eczema   . Hyperlipidemia   . Osteopenia   . Vitamin d deficiency   . Anemia   . Arthritis   . Shingles   . Celiac disease     Past Surgical History  Procedure Date  . Cataract extraction     bilateral  . Dilation and curettage of uterus     Social History Gianella A Fabio  reports that she has never smoked. She has never used smokeless tobacco. She reports that she does not drink alcohol or use illicit drugs.  family history includes Diabetes in her father; Heart disease in her father; and Liver cancer in her mother.  There is no history of Colon cancer.  No Known Allergies     PHYSICAL EXAMINATION: Vital signs: BP 116/58  Pulse 64  Ht 5' 7"  (1.702 m)  Wt 156 lb (70.761 kg)  BMI 24.43 kg/m2 General: Well-developed, well-nourished, no acute distress HEENT: Sclerae are anicteric, conjunctiva pink. Oral mucosa intact Lungs: Clear Heart: Regular Abdomen: soft, nontender, nondistended, no obvious ascites, no peritoneal signs, normal bowel sounds. No organomegaly. Extremities: No edema Psychiatric: alert and oriented x3. Cooperative      ASSESSMENT:  #1 celiac sprue. Asymptomatic on  gluten-free diet #2. Intestinal gas. Hold with probiotic #3. Surveillance colonoscopy due to 2015  PLAN:  #1. Continue gluten avoidance #2. Samples of probiotic Align provided #3. Routine GI followup in 1 year. Sooner for interval questions or problems #4. Surveillance colonoscopy 2015

## 2011-09-13 NOTE — Patient Instructions (Signed)
We have given you samples of Align. This puts good bacteria back into your colon. You should take 1 capsule by mouth once daily. If this works well for you, it can be purchased over the counter.   Please follow up in one year

## 2012-09-17 ENCOUNTER — Encounter: Payer: Self-pay | Admitting: Internal Medicine

## 2012-09-17 ENCOUNTER — Ambulatory Visit (INDEPENDENT_AMBULATORY_CARE_PROVIDER_SITE_OTHER): Payer: BC Managed Care – PPO | Admitting: Internal Medicine

## 2012-09-17 VITALS — BP 128/70 | HR 60 | Ht 66.0 in | Wt 160.5 lb

## 2012-09-17 DIAGNOSIS — K900 Celiac disease: Secondary | ICD-10-CM

## 2012-09-17 MED ORDER — PROBIOTIC DAILY PO CAPS: 1.0000 | ORAL_CAPSULE | Freq: Every day | ORAL | Status: DC

## 2012-09-17 NOTE — Progress Notes (Signed)
HISTORY OF PRESENT ILLNESS:  Susan Bell is a 72 y.o. female who is followed in this office for celiac sprue (diagnosed by serology and biopsy February 2011). She presents today for routine annual followup. She was last evaluated in 09/13/2011. See that dictation for details. She reports being, for the most part, gluten-free. With some dietary indiscretion she does notice increased intestinal gas. She does take probiotic, for gas, which she thinks helps. GI review of systems is otherwise negative. No interval medical or surgical problems. Routine evaluation with her primary provider earlier this year was favorable  REVIEW OF SYSTEMS:  All non-GI ROS negative except for joint aches  Past Medical History  Diagnosis Date  . Hypothyroidism   . Eczema   . Hyperlipidemia   . Osteopenia   . Vitamin D deficiency   . Anemia   . Arthritis   . Shingles   . Celiac disease     Past Surgical History  Procedure Laterality Date  . Cataract extraction      bilateral  . Dilation and curettage of uterus      Social History Susan Bell  reports that she has never smoked. She has never used smokeless tobacco. She reports that she does not drink alcohol or use illicit drugs.  family history includes Diabetes in her father; Heart disease in her father; and Liver cancer in her mother.  There is no history of Colon cancer.  No Known Allergies     PHYSICAL EXAMINATION: Vital signs: BP 128/70  Pulse 60  Ht 5\' 6"  (1.676 m)  Wt 160 lb 8 oz (72.802 kg)  BMI 25.92 kg/m2 General: Well-developed, well-nourished, no acute distress HEENT: Sclerae are anicteric, conjunctiva pink. Oral mucosa intact Lungs: Clear Heart: Regular Abdomen: soft, nontender, nondistended, no obvious ascites, no peritoneal signs, normal bowel sounds. No organomegaly. Extremities: No edema Psychiatric: alert and oriented x3. Cooperative   ASSESSMENT:  #1. Celiac sprue. Essentially asymptomatic on gluten-free  diet #2. Increased intestinal gas #3. Surveillance colonoscopy due 2015. Last colonoscopy, elsewhere 2005, negative   PLAN:  #1. Continue gluten avoidance #2. Probiotic samples provided at her request #3. Routine office followup in 1 year. Schedule surveillance colonoscopy at that time. Contact the office in the interim for any questions or problems

## 2012-09-17 NOTE — Patient Instructions (Addendum)
We have given you samples of [employer] [facility]. This puts good bacteria back into your colon. You should take 1 capsule by mouth once daily. If this works well for you, it can be purchased over the counter.

## 2012-10-01 ENCOUNTER — Ambulatory Visit: Payer: BC Managed Care – PPO | Admitting: Internal Medicine

## 2012-11-08 DIAGNOSIS — E039 Hypothyroidism, unspecified: Secondary | ICD-10-CM | POA: Diagnosis not present

## 2012-11-08 DIAGNOSIS — M949 Disorder of cartilage, unspecified: Secondary | ICD-10-CM | POA: Diagnosis not present

## 2012-11-08 DIAGNOSIS — R030 Elevated blood-pressure reading, without diagnosis of hypertension: Secondary | ICD-10-CM | POA: Diagnosis not present

## 2012-11-08 DIAGNOSIS — K900 Celiac disease: Secondary | ICD-10-CM | POA: Diagnosis not present

## 2012-11-08 DIAGNOSIS — I7300 Raynaud's syndrome without gangrene: Secondary | ICD-10-CM | POA: Diagnosis not present

## 2012-11-08 DIAGNOSIS — Z6826 Body mass index (BMI) 26.0-26.9, adult: Secondary | ICD-10-CM | POA: Diagnosis not present

## 2012-11-08 DIAGNOSIS — E785 Hyperlipidemia, unspecified: Secondary | ICD-10-CM | POA: Diagnosis not present

## 2012-11-08 DIAGNOSIS — M899 Disorder of bone, unspecified: Secondary | ICD-10-CM | POA: Diagnosis not present

## 2012-12-13 DIAGNOSIS — Z23 Encounter for immunization: Secondary | ICD-10-CM | POA: Diagnosis not present

## 2013-05-08 DIAGNOSIS — E039 Hypothyroidism, unspecified: Secondary | ICD-10-CM | POA: Diagnosis not present

## 2013-05-08 DIAGNOSIS — M949 Disorder of cartilage, unspecified: Secondary | ICD-10-CM | POA: Diagnosis not present

## 2013-05-08 DIAGNOSIS — E785 Hyperlipidemia, unspecified: Secondary | ICD-10-CM | POA: Diagnosis not present

## 2013-05-08 DIAGNOSIS — M899 Disorder of bone, unspecified: Secondary | ICD-10-CM | POA: Diagnosis not present

## 2013-05-08 DIAGNOSIS — R82998 Other abnormal findings in urine: Secondary | ICD-10-CM | POA: Diagnosis not present

## 2013-05-16 DIAGNOSIS — Z1331 Encounter for screening for depression: Secondary | ICD-10-CM | POA: Diagnosis not present

## 2013-05-16 DIAGNOSIS — I7300 Raynaud's syndrome without gangrene: Secondary | ICD-10-CM | POA: Diagnosis not present

## 2013-05-16 DIAGNOSIS — E039 Hypothyroidism, unspecified: Secondary | ICD-10-CM | POA: Diagnosis not present

## 2013-05-16 DIAGNOSIS — R030 Elevated blood-pressure reading, without diagnosis of hypertension: Secondary | ICD-10-CM | POA: Diagnosis not present

## 2013-05-16 DIAGNOSIS — M899 Disorder of bone, unspecified: Secondary | ICD-10-CM | POA: Diagnosis not present

## 2013-05-16 DIAGNOSIS — M949 Disorder of cartilage, unspecified: Secondary | ICD-10-CM | POA: Diagnosis not present

## 2013-05-16 DIAGNOSIS — K900 Celiac disease: Secondary | ICD-10-CM | POA: Diagnosis not present

## 2013-05-16 DIAGNOSIS — Z0000 Encounter for general adult medical examination without abnormal findings: Secondary | ICD-10-CM | POA: Diagnosis not present

## 2013-05-16 DIAGNOSIS — E785 Hyperlipidemia, unspecified: Secondary | ICD-10-CM | POA: Diagnosis not present

## 2013-05-16 DIAGNOSIS — Z23 Encounter for immunization: Secondary | ICD-10-CM | POA: Diagnosis not present

## 2013-05-16 DIAGNOSIS — L259 Unspecified contact dermatitis, unspecified cause: Secondary | ICD-10-CM | POA: Diagnosis not present

## 2013-05-21 DIAGNOSIS — Z1212 Encounter for screening for malignant neoplasm of rectum: Secondary | ICD-10-CM | POA: Diagnosis not present

## 2013-06-17 ENCOUNTER — Encounter: Payer: Self-pay | Admitting: Internal Medicine

## 2013-06-17 ENCOUNTER — Ambulatory Visit (INDEPENDENT_AMBULATORY_CARE_PROVIDER_SITE_OTHER): Payer: Medicare Other | Admitting: Internal Medicine

## 2013-06-17 ENCOUNTER — Other Ambulatory Visit: Payer: Medicare Other

## 2013-06-17 VITALS — BP 123/64 | HR 80 | Ht 66.0 in | Wt 162.6 lb

## 2013-06-17 DIAGNOSIS — R142 Eructation: Secondary | ICD-10-CM

## 2013-06-17 DIAGNOSIS — R141 Gas pain: Secondary | ICD-10-CM

## 2013-06-17 DIAGNOSIS — K900 Celiac disease: Secondary | ICD-10-CM | POA: Diagnosis not present

## 2013-06-17 DIAGNOSIS — R143 Flatulence: Secondary | ICD-10-CM

## 2013-06-17 DIAGNOSIS — Z1211 Encounter for screening for malignant neoplasm of colon: Secondary | ICD-10-CM | POA: Diagnosis not present

## 2013-06-17 MED ORDER — MOVIPREP 100 G PO SOLR: 1.0000 | Freq: Once | ORAL | Status: DC

## 2013-06-17 NOTE — Progress Notes (Signed)
HISTORY OF PRESENT ILLNESS:  Susan Bell is a 73 y.o. female with the below listed medical history who is followed in this office for celiac disease. The diagnosis was made on the basis of serology and biopsy. She continues on gluten-free diet. No significant GI complaints. Takes prunes to keep her bowels regular. Last tissue transglutaminase antibody obtained 2 years ago was normal. Last colonoscopy 2005 (elsewhere) was negative for neoplasia. Now due.  REVIEW OF SYSTEMS:  All non-GI ROS negative upon comprehensive review  Past Medical History  Diagnosis Date  . Hypothyroidism   . Eczema   . Hyperlipidemia   . Osteopenia   . Vitamin D deficiency   . Anemia   . Arthritis   . Shingles   . Celiac disease     Past Surgical History  Procedure Laterality Date  . Cataract extraction      bilateral  . Dilation and curettage of uterus      Social History Susan Bell  reports that she has never smoked. She has never used smokeless tobacco. She reports that she does not drink alcohol or use illicit drugs.  family history includes Diabetes in her father; Heart disease in her father; Liver cancer in her mother. There is no history of Colon cancer.  No Known Allergies     PHYSICAL EXAMINATION: Vital signs: BP 123/64  Pulse 80  Ht 5' 6"  (1.676 m)  Wt 162 lb 9.6 oz (73.755 kg)  BMI 26.26 kg/m2 General: Well-developed, well-nourished, no acute distress HEENT: Sclerae are anicteric, conjunctiva pink. Oral mucosa intact Lungs: Clear Heart: Regular Abdomen: soft, nontender, nondistended, no obvious ascites, no peritoneal signs, normal bowel sounds. No organomegaly. Extremities: No edema Psychiatric: alert and oriented x3. Cooperative   ASSESSMENT:  #1. Celiac sprue. Minimal to no symptoms on gluten-free diet #2. Screening colonoscopy. Due for repeat screening  PLAN:  #1. Continue gluten-free diet #2. Tissue transglutaminase antibody #3. Screening colonoscopy.  Movi prep prescribed. Patient instructed on its useThe nature of the procedure, as well as the risks, benefits, and alternatives were carefully and thoroughly reviewed with the patient. Ample time for discussion and questions allowed. The patient understood, was satisfied, and agreed to proceed.

## 2013-06-17 NOTE — Patient Instructions (Signed)
  Your physician has requested that you go to the basement for the following lab work before leaving today:  TTG  You have been scheduled for a colonoscopy with propofol. Please follow written instructions given to you at your visit today.  Please pick up your prep kit at the pharmacy within the next 1-3 days. If you use inhalers (even only as needed), please bring them with you on the day of your procedure. Your physician has requested that you go to www.[email or web address].com and enter the access code given to you at your visit today. This web site gives a general overview about your procedure. However, you should still follow specific instructions given to you by our office regarding your preparation for the procedure.

## 2013-06-18 LAB — TISSUE TRANSGLUTAMINASE, IGA: Tissue Transglutaminase Ab, IgA: 7.4 U/mL (ref <20)

## 2013-07-02 ENCOUNTER — Encounter: Payer: Self-pay | Admitting: Internal Medicine

## 2013-08-01 ENCOUNTER — Encounter: Payer: Medicare Other | Admitting: Internal Medicine

## 2013-08-30 ENCOUNTER — Ambulatory Visit: Payer: Self-pay | Admitting: Podiatrist

## 2013-09-13 ENCOUNTER — Ambulatory Visit: Payer: Self-pay | Admitting: Podiatrist

## 2013-09-16 DIAGNOSIS — Z1231 Encounter for screening mammogram for malignant neoplasm of breast: Secondary | ICD-10-CM | POA: Diagnosis not present

## 2013-11-18 DIAGNOSIS — E785 Hyperlipidemia, unspecified: Secondary | ICD-10-CM | POA: Diagnosis not present

## 2013-11-18 DIAGNOSIS — M949 Disorder of cartilage, unspecified: Secondary | ICD-10-CM | POA: Diagnosis not present

## 2013-11-18 DIAGNOSIS — R030 Elevated blood-pressure reading, without diagnosis of hypertension: Secondary | ICD-10-CM | POA: Diagnosis not present

## 2013-11-18 DIAGNOSIS — M899 Disorder of bone, unspecified: Secondary | ICD-10-CM | POA: Diagnosis not present

## 2013-11-18 DIAGNOSIS — Z6826 Body mass index (BMI) 26.0-26.9, adult: Secondary | ICD-10-CM | POA: Diagnosis not present

## 2013-11-18 DIAGNOSIS — E039 Hypothyroidism, unspecified: Secondary | ICD-10-CM | POA: Diagnosis not present

## 2013-11-18 DIAGNOSIS — K900 Celiac disease: Secondary | ICD-10-CM | POA: Diagnosis not present

## 2013-11-20 ENCOUNTER — Encounter: Payer: Self-pay | Admitting: Internal Medicine

## 2013-12-23 DIAGNOSIS — H11009 Unspecified pterygium of unspecified eye: Secondary | ICD-10-CM | POA: Diagnosis not present

## 2013-12-23 DIAGNOSIS — H47329 Drusen of optic disc, unspecified eye: Secondary | ICD-10-CM | POA: Diagnosis not present

## 2013-12-23 DIAGNOSIS — Z961 Presence of intraocular lens: Secondary | ICD-10-CM | POA: Diagnosis not present

## 2014-01-03 DIAGNOSIS — Z23 Encounter for immunization: Secondary | ICD-10-CM | POA: Diagnosis not present

## 2014-01-13 ENCOUNTER — Encounter: Payer: Self-pay | Admitting: Internal Medicine

## 2014-01-17 ENCOUNTER — Encounter: Payer: Medicare Other | Admitting: Internal Medicine

## 2014-03-05 ENCOUNTER — Ambulatory Visit (INDEPENDENT_AMBULATORY_CARE_PROVIDER_SITE_OTHER): Payer: Medicare Other | Admitting: Podiatrist

## 2014-03-05 ENCOUNTER — Encounter: Payer: Self-pay | Admitting: Podiatrist

## 2014-03-05 ENCOUNTER — Ambulatory Visit (INDEPENDENT_AMBULATORY_CARE_PROVIDER_SITE_OTHER): Payer: Medicare Other

## 2014-03-05 VITALS — BP 151/71 | HR 65 | Resp 16

## 2014-03-05 DIAGNOSIS — L600 Ingrowing nail: Secondary | ICD-10-CM | POA: Diagnosis not present

## 2014-03-05 DIAGNOSIS — M722 Plantar fascial fibromatosis: Secondary | ICD-10-CM

## 2014-03-05 NOTE — Patient Instructions (Addendum)
ANTIBACTERIAL SOAP INSTRUCTIONS  THE DAY AFTER PROCEDURE   For the first dressing change (soak) Place 3-4 drops of antibacterial liquid soap in a quart of warm tap water.  Submerge foot into water for 20 minutes.  If bandage was applied after your procedure, leave on to allow for easy lift off, then remove and continue with soak for the remaining time.  Next, blot area dry with a soft cloth and apply antibiotic ointment such as polysporin, neosporin, or triple antibiotic ointment.  You may then switch to the instructions below    Shower as usual. Before getting out, place a drop of antibacterial liquid soap (Dial) on a wet, clean washcloth.  Gently wipe washcloth over affected area.  Afterward, rinse the area with warm water.  Blot the area dry with a soft cloth and cover with antibiotic ointment (neosporin, polysporin, bacitracin) and band aid or gauze and tape    Long Term Care Instructions-Post Nail Surgery  You have had your ingrown toenail and root treated with a chemical.  This chemical causes a burn that will drain and ooze like a blister.  1-2 weeks after the procedure you may leave the area open to air at night to help dry it up.  During the day,  It is important to keep this area clean and covered until the toe dries out and forms a scab. Once the scab forms you no longer need to soak or apply a dressing.  If at any time you experience an increase in pain, redness, swelling, or drainage, you should contact the office as soon as possible.     Plantar Fasciitis (Heel Spur Syndrome) with Rehab The plantar fascia is a fibrous, ligament-like, soft-tissue structure that spans the bottom of the foot. Plantar fasciitis is a condition that causes pain in the foot due to inflammation of the tissue. SYMPTOMS   Pain and tenderness on the underneath side of the foot.  Pain that worsens with standing or walking. CAUSES  Plantar fasciitis is caused by irritation and injury to the plantar  fascia on the underneath side of the foot. Common mechanisms of injury include: 2. Direct trauma to bottom of the foot. 3. Damage to a small nerve that runs under the foot where the main fascia attaches to the heel bone. 4. Stress placed on the plantar fascia due to bone spurs. RISK INCREASES WITH:   Activities that place stress on the plantar fascia (running, jumping, pivoting, or cutting).  Poor strength and flexibility.  Improperly fitted shoes.  Tight calf muscles.  Flat feet.  Failure to warm-up properly before activity.  Obesity. PREVENTION  Warm up and stretch properly before activity.  Allow for adequate recovery between workouts.  Maintain physical fitness:  Strength, flexibility, and endurance.  Cardiovascular fitness.  Maintain a health body weight.  Avoid stress on the plantar fascia.  Wear properly fitted shoes, including arch supports for individuals who have flat feet.  PROGNOSIS  If treated properly, then the symptoms of plantar fasciitis usually resolve without surgery. However, occasionally surgery is necessary.  RELATED COMPLICATIONS   Recurrent symptoms that may result in a chronic condition.  Problems of the lower back that are caused by compensating for the injury, such as limping.  Pain or weakness of the foot during push-off following surgery.  Chronic inflammation, scarring, and partial or complete fascia tear, occurring more often from repeated injections.  TREATMENT  Treatment initially involves the use of ice and medication to help reduce pain and inflammation.  The use of strengthening and stretching exercises may help reduce pain with activity, especially stretches of the Achilles tendon. These exercises may be performed at home or with a therapist. Your caregiver may recommend that you use heel cups of arch supports to help reduce stress on the plantar fascia. Occasionally, corticosteroid injections are given to reduce inflammation. If  symptoms persist for greater than 6 months despite non-surgical (conservative), then surgery may be recommended.   MEDICATION   If pain medication is necessary, then nonsteroidal anti-inflammatory medications, such as aspirin and ibuprofen, or other minor pain relievers, such as acetaminophen, are often recommended.  Do not take pain medication within 7 days before surgery.  Prescription pain relievers may be given if deemed necessary by your caregiver. Use only as directed and only as much as you need.  Corticosteroid injections may be given by your caregiver. These injections should be reserved for the most serious cases, because they may only be given a certain number of times.  HEAT AND COLD  Cold treatment (icing) relieves pain and reduces inflammation. Cold treatment should be applied for 10 to 15 minutes every 2 to 3 hours for inflammation and pain and immediately after any activity that aggravates your symptoms. Use ice packs or massage the area with a piece of ice (ice massage).  Heat treatment may be used prior to performing the stretching and strengthening activities prescribed by your caregiver, physical therapist, or athletic trainer. Use a heat pack or soak the injury in warm water.  SEEK IMMEDIATE MEDICAL CARE IF:  Treatment seems to offer no benefit, or the condition worsens.  Any medications produce adverse side effects.  EXERCISES- RANGE OF MOTION (ROM) AND STRETCHING EXERCISES - Plantar Fasciitis (Heel Spur Syndrome) These exercises may help you when beginning to rehabilitate your injury. Your symptoms may resolve with or without further involvement from your physician, physical therapist or athletic trainer. While completing these exercises, remember:   Restoring tissue flexibility helps normal motion to return to the joints. This allows healthier, less painful movement and activity.  An effective stretch should be held for at least 30 seconds.  A stretch should  never be painful. You should only feel a gentle lengthening or release in the stretched tissue.  RANGE OF MOTION - Toe Extension, Flexion  Sit with your right / left leg crossed over your opposite knee.  Grasp your toes and gently pull them back toward the top of your foot. You should feel a stretch on the bottom of your toes and/or foot.  Hold this stretch for 10 seconds.  Now, gently pull your toes toward the bottom of your foot. You should feel a stretch on the top of your toes and or foot.  Hold this stretch for 10 seconds. Repeat  times. Complete this stretch 3 times per day.   RANGE OF MOTION - Ankle Dorsiflexion, Active Assisted  Remove shoes and sit on a chair that is preferably not on a carpeted surface.  Place right / left foot under knee. Extend your opposite leg for support.  Keeping your heel down, slide your right / left foot back toward the chair until you feel a stretch at your ankle or calf. If you do not feel a stretch, slide your bottom forward to the edge of the chair, while still keeping your heel down.  Hold this stretch for 10 seconds. Repeat 3 times. Complete this stretch 2 times per day.   STRETCH  Gastroc, Standing  Place hands on wall.  Extend right / left leg, keeping the front knee somewhat bent.  Slightly point your toes inward on your back foot.  Keeping your right / left heel on the floor and your knee straight, shift your weight toward the wall, not allowing your back to arch.  You should feel a gentle stretch in the right / left calf. Hold this position for 10 seconds. Repeat 3 times. Complete this stretch 2 times per day.  STRETCH  Soleus, Standing  Place hands on wall.  Extend right / left leg, keeping the other knee somewhat bent.  Slightly point your toes inward on your back foot.  Keep your right / left heel on the floor, bend your back knee, and slightly shift your weight over the back leg so that you feel a gentle stretch deep in  your back calf.  Hold this position for 10 seconds. Repeat 3 times. Complete this stretch 2 times per day.  STRETCH  Gastrocsoleus, Standing  Note: This exercise can place a lot of stress on your foot and ankle. Please complete this exercise only if specifically instructed by your caregiver.   Place the ball of your right / left foot on a step, keeping your other foot firmly on the same step.  Hold on to the wall or a rail for balance.  Slowly lift your other foot, allowing your body weight to press your heel down over the edge of the step.  You should feel a stretch in your right / left calf.  Hold this position for 10 seconds.  Repeat this exercise with a slight bend in your right / left knee. Repeat 3 times. Complete this stretch 2 times per day.   STRENGTHENING EXERCISES - Plantar Fasciitis (Heel Spur Syndrome)  These exercises may help you when beginning to rehabilitate your injury. They may resolve your symptoms with or without further involvement from your physician, physical therapist or athletic trainer. While completing these exercises, remember:   Muscles can gain both the endurance and the strength needed for everyday activities through controlled exercises.  Complete these exercises as instructed by your physician, physical therapist or athletic trainer. Progress the resistance and repetitions only as guided.  STRENGTH - Towel Curls  Sit in a chair positioned on a non-carpeted surface.  Place your foot on a towel, keeping your heel on the floor.  Pull the towel toward your heel by only curling your toes. Keep your heel on the floor. Repeat 3 times. Complete this exercise 2 times per day.  STRENGTH - Ankle Inversion  Secure one end of a rubber exercise band/tubing to a fixed object (table, pole). Loop the other end around your foot just before your toes.  Place your fists between your knees. This will focus your strengthening at your ankle.  Slowly, pull your  big toe up and in, making sure the band/tubing is positioned to resist the entire motion.  Hold this position for 10 seconds.  Have your muscles resist the band/tubing as it slowly pulls your foot back to the starting position. Repeat 3 times. Complete this exercises 2 times per day.  Document Released: 03/21/2005 Document Revised: 06/13/2011 Document Reviewed: 07/03/2008 [facility] Patient Information 2014 [place], [place].

## 2014-03-06 NOTE — Progress Notes (Signed)
Chief Complaint  Patient presents with  . Foot Pain    plantar heel left   "My heel has been hurting, especially in the morning"  . Toe Pain    4th toe right - lateral border   "I think I have an ingrown nail"     HPI: Patient is 73 y.o. female who presents today for left heel pain and lateral right fourth toe pain.  She relates pain on the heel when standing and sitting.     Allergies  Allergen Reactions  . Betadine [Povidone Iodine]     itching    Physical Exam  Patient is awake, alert, and oriented x 3.  In no acute distress.  Vascular status is intact with palpable pedal pulses at 2/4 DP and PT bilateral and capillary refill time within normal limits. Neurological sensation is also intact bilaterally via Semmes Weinstein monofilament at 5/5 sites. Light touch, vibratory sensation, Achilles tendon reflex is intact. Dermatological exam reveals skin color, turger and texture as normal. No open lesions present.  Musculature intact with dorsiflexion, plantarflexion, inversion, eversion.  Pain on palpation plantar medial heel is noted on the left heel.  Mild discomfort is present with palpation consistent with plantar fasciitis.  Lateral side of the left fourth toenail is painful.  No redness, swelling or sign of infection is present.  Irritation and pain reported.    Assessment: plantar fasciitis left and ingrown toenail lateral fourth toenail  Plan: Discussed treatment options and at this time a plantar fascial injection was recommended.  The patient agreed and a sterile skin prep was applied.  An injection consisting of kenalog and marcaine mixture was infiltrated at the point of maximal tenderness on the left Heel.  The patient tolerated this well and was given instructions for aftercare.    Also recommended permanent phenol matrixectomy and patient agreed.  Right fourth toe was prepped with alcohol and a 1 to 1 mix of 0.5% marcaine plain and 2% lidocaine plain was administered in a  digital block fashion.  The toe was then prepped with and exsanguinated.  The offending nail border was then excised and matrix tissue exposed.  Phenol was then applied to the matrix tissue followed by an alcohol wash.  Antibiotic ointment and a dry sterile dressing was applied.  The patient was dispensed instructions for aftercare.

## 2014-04-11 ENCOUNTER — Encounter: Payer: Self-pay | Admitting: Podiatrist

## 2014-04-11 ENCOUNTER — Ambulatory Visit (INDEPENDENT_AMBULATORY_CARE_PROVIDER_SITE_OTHER): Payer: Medicare Other | Admitting: Podiatrist

## 2014-04-11 VITALS — BP 138/65 | HR 74 | Resp 16

## 2014-04-11 DIAGNOSIS — M722 Plantar fascial fibromatosis: Secondary | ICD-10-CM | POA: Diagnosis not present

## 2014-04-11 MED ORDER — TRIAMCINOLONE ACETONIDE 10 MG/ML IJ SUSP
10.0000 mg | Freq: Once | INTRAMUSCULAR | Status: AC
  Administered 2014-04-11: 10 mg

## 2014-04-11 MED ORDER — MELOXICAM 15 MG PO TABS: 15.0000 mg | ORAL_TABLET | Freq: Every day | ORAL | Status: DC

## 2014-04-11 NOTE — Patient Instructions (Addendum)
Plantar Fasciitis (Heel Spur Syndrome) with Rehab The plantar fascia is a fibrous, ligament-like, soft-tissue structure that spans the bottom of the foot. Plantar fasciitis is a condition that causes pain in the foot due to inflammation of the tissue. SYMPTOMS   Pain and tenderness on the underneath side of the foot.  Pain that worsens with standing or walking. CAUSES  Plantar fasciitis is caused by irritation and injury to the plantar fascia on the underneath side of the foot. Common mechanisms of injury include:  Direct trauma to bottom of the foot.  Damage to a small nerve that runs under the foot where the main fascia attaches to the heel bone. Stress placed on the plantar fascia due to any mild increased activity or injury RISK INCREASES WITH:   Obesity.  Poor strength and flexibility.  Improperly fitted shoes.  Tight calf muscles.  Flat feet.  Failure to warm-up properly before activity.  PREVENTION  Warm up and stretch properly before activity.  Strength, flexibility  Maintain a health body weight.  Avoid stress on the plantar fascia.  Wear properly fitted shoes, including arch supports for individuals who have flat feet. PROGNOSIS  If treated properly, then the symptoms of plantar fasciitis usually resolve without surgery. However, occasionally surgery is necessary. RELATED COMPLICATIONS   Recurrent symptoms that may result in a chronic condition.  Problems of the lower back that are caused by compensating for the injury, such as limping.  Pain or weakness of the foot during push-off following surgery.  Chronic inflammation, scarring, and partial or complete fascia tear, occurring more often from repeated injections. TREATMENT  Treatment initially involves the use of ice and medication to help reduce pain and inflammation. The use of strengthening and stretching exercises may help reduce pain with activity, especially stretches of the Achilles tendon.  Your  caregiver may recommend that you use arch supports to help reduce stress on the plantar fascia. Often, corticosteroid injections are given to reduce inflammation. If symptoms persist for greater than 6 months despite non-surgical (conservative), then surgery may be recommended.  MEDICATION   If pain medication is necessary, then nonsteroidal anti-inflammatory medications, such as aspirin and ibuprofen, or other minor pain relievers, such as acetaminophen, are often recommended. Corticosteroid injections may be given by your caregiver.  HEAT AND COLD  Cold treatment (icing) relieves pain and reduces inflammation. Cold treatment should be applied for 10 to 15 minutes every 2 to 3 hours for inflammation and pain and immediately after any activity that aggravates your symptoms. Use ice packs or massage the area with a piece of ice (ice massage).  Heat treatment may be used prior to performing the stretching and strengthening activities prescribed by your caregiver, physical therapist, or athletic trainer. Use a heat pack or soak the injury in warm water. SEEK IMMEDIATE MEDICAL CARE IF:  Treatment seems to offer no benefit, or the condition worsens.  Any medications produce adverse side effects.    EXERCISES-- perform each exercise a total of 10-15 repetitions.  Hold for 30 seconds and perform 3 times per day   RANGE OF MOTION (ROM) AND STRETCHING EXERCISES - Plantar Fasciitis (Heel Spur Syndrome) These exercises may help you when beginning to rehabilitate your injury.   While completing these exercises, remember:   Restoring tissue flexibility helps normal motion to return to the joints. This allows healthier, less painful movement and activity.  An effective stretch should be held for at least 30 seconds.  A stretch should never be painful. You   should only feel a gentle lengthening or release in the stretched tissue. RANGE OF MOTION - Toe Extension, Flexion  Sit with your right / left  leg crossed over your opposite knee.  Grasp your toes and gently pull them back toward the top of your foot. You should feel a stretch on the bottom of your toes and/or foot.  Hold this stretch for __________ seconds.  Now, gently pull your toes toward the bottom of your foot. You should feel a stretch on the top of your toes and or foot.  Hold this stretch for __________ seconds. Repeat __________ times. Complete this stretch __________ times per day.  RANGE OF MOTION - Ankle Dorsiflexion, Active Assisted  Remove shoes and sit on a chair that is preferably not on a carpeted surface.  Place right / left foot under knee. Extend your opposite leg for support.  Keeping your heel down, slide your right / left foot back toward the chair until you feel a stretch at your ankle or calf. If you do not feel a stretch, slide your bottom forward to the edge of the chair, while still keeping your heel down.  Hold this stretch for __________ seconds. Repeat __________ times. Complete this stretch __________ times per day.  STRETCH  Gastroc, Standing  Place hands on wall.  Extend right / left leg, keeping the front knee somewhat bent.  Slightly point your toes inward on your back foot.  Keeping your right / left heel on the floor and your knee straight, shift your weight toward the wall, not allowing your back to arch.  You should feel a gentle stretch in the right / left calf. Hold this position for __________ seconds. Repeat __________ times. Complete this stretch __________ times per day. STRETCH  Soleus, Standing  Place hands on wall.  Extend right / left leg, keeping the other knee somewhat bent.  Slightly point your toes inward on your back foot.  Keep your right / left heel on the floor, bend your back knee, and slightly shift your weight over the back leg so that you feel a gentle stretch deep in your back calf.  Hold this position for __________ seconds. Repeat __________ times.  Complete this stretch __________ times per day. STRETCH  Gastrocsoleus, Standing  Note: This exercise can place a lot of stress on your foot and ankle. Please complete this exercise only if specifically instructed by your caregiver.   Place the ball of your right / left foot on a step, keeping your other foot firmly on the same step.  Hold on to the wall or a rail for balance.  Slowly lift your other foot, allowing your body weight to press your heel down over the edge of the step.  You should feel a stretch in your right / left calf.  Hold this position for __________ seconds.  Repeat this exercise with a slight bend in your right / left knee. Repeat __________ times. Complete this stretch __________ times per day.  STRENGTHENING EXERCISES - Plantar Fasciitis (Heel Spur Syndrome)  These exercises may help you when beginning to rehabilitate your injury. They may resolve your symptoms with or without further involvement from your physician, physical therapist or athletic trainer. While completing these exercises, remember:   Muscles can gain both the endurance and the strength needed for everyday activities through controlled exercises.  Complete these exercises as instructed by your physician, physical therapist or athletic trainer. Progress the resistance and repetitions only as guided.  SOLE-- a brand of over the counter inserts available at [facility] or [facility]-- usually customizable by heating them up so they conform to your arch.

## 2014-04-17 ENCOUNTER — Ambulatory Visit (AMBULATORY_SURGERY_CENTER): Payer: Self-pay | Admitting: *Deleted

## 2014-04-17 VITALS — Ht 66.5 in | Wt 163.0 lb

## 2014-04-17 DIAGNOSIS — Z1211 Encounter for screening for malignant neoplasm of colon: Secondary | ICD-10-CM

## 2014-04-17 MED ORDER — MOVIPREP 100 G PO SOLR
1.0000 | Freq: Once | ORAL | Status: DC
Start: 2014-04-17 — End: 2014-05-01

## 2014-04-17 NOTE — Progress Notes (Signed)
No egg or soy allergy. No anesthesia problems.  NO home O2.  No diet meds.  Sample prep given to pt.

## 2014-04-17 NOTE — Progress Notes (Signed)
Chief Complaint  Patient presents with  . Plantar Fasciitis    Follow up plantar left heel   "Its getting bad again"  . Ingrown Toenail    Follow up 4th toenail right s/p AP nail   "The toe is fine. All healed"     HPI: Patient is 74 y.o. female who presents today for follow up of left heel pain. She relates the last injection was helpful however it is starting to get worse.  She relates pain with first step in the morning or after sitting for long periods when she goes to get up. Also relates the toenail healed well from the ap nail procedure.    Allergies  Allergen Reactions  . Betadine [Povidone Iodine]     itching    Physical Exam  Patient is awake, alert, and oriented x 3.  In no acute distress.  Vascular status is intact with palpable pedal pulses at 2/4 DP and PT bilateral and capillary refill time within normal limits. Neurological sensation is also intact bilaterally via Semmes Weinstein monofilament at 5/5 sites. Light touch, vibratory sensation, Achilles tendon reflex is intact. Dermatological exam reveals skin color, turger and texture as normal. No open lesions present.  Musculature intact with dorsiflexion, plantarflexion, inversion, eversion.  Pain on palpation plantar medial heel is noted on the left heel.  Mild discomfort is present with palpation consistent with plantar fasciitis.   Assessment: plantar fasciitis left    Plan: Discussed treatment options and at this time a plantar fascial injection was recommended for injection number 2.  Power step inserts were also dispensed as well as a night splint and instructions for wear and use were also given.  She will continue stretching exercises and I will check her again in 3 weeks.

## 2014-05-01 ENCOUNTER — Ambulatory Visit (AMBULATORY_SURGERY_CENTER): Payer: Medicare Other | Admitting: Internal Medicine

## 2014-05-01 ENCOUNTER — Encounter: Payer: Self-pay | Admitting: Internal Medicine

## 2014-05-01 VITALS — BP 157/69 | HR 60 | Temp 96.9°F | Resp 110 | Ht 66.5 in | Wt 163.0 lb

## 2014-05-01 DIAGNOSIS — D122 Benign neoplasm of ascending colon: Secondary | ICD-10-CM | POA: Diagnosis not present

## 2014-05-01 DIAGNOSIS — Z1211 Encounter for screening for malignant neoplasm of colon: Secondary | ICD-10-CM | POA: Diagnosis not present

## 2014-05-01 DIAGNOSIS — D123 Benign neoplasm of transverse colon: Secondary | ICD-10-CM

## 2014-05-01 MED ORDER — SODIUM CHLORIDE 0.9 % IV SOLN: 500.0000 mL | INTRAVENOUS | Status: DC

## 2014-05-01 NOTE — Progress Notes (Signed)
Patient awakening,vss,report to rn 

## 2014-05-01 NOTE — Patient Instructions (Signed)
Discharge instructions given. Handouts on polyps and diverticulosis. Resume previous medications. Your may notice some irritation in your nose or some drainage.  This may cause feelings of congestion.  This is from the oxygen, which can be very drying.  There is no need for concern, this should clear up in a day or so YOU HAD AN ENDOSCOPIC PROCEDURE TODAY AT [facility]: Refer to the procedure report that was given to you for any specific questions about what was found during the examination.  If the procedure report does not answer your questions, please call your gastroenterologist to clarify.  If you requested that your care partner not be given the details of your procedure findings, then the procedure report has been included in a sealed envelope for you to review at your convenience later.  YOU SHOULD EXPECT: Some feelings of bloating in the abdomen. Passage of more gas than usual.  Walking can help get rid of the air that was put into your GI tract during the procedure and reduce the bloating. If you had a lower endoscopy (such as a colonoscopy or flexible sigmoidoscopy) you may notice spotting of blood in your stool or on the toilet paper. If you underwent a bowel prep for your procedure, then you may not have a normal bowel movement for a few days.  DIET: Your first meal following the procedure should be a light meal and then it is ok to progress to your normal diet.  A half-sandwich or bowl of soup is an example of a good first meal.  Heavy or fried foods are harder to digest and may make you feel nauseous or bloated.  Likewise meals heavy in dairy and vegetables can cause extra gas to form and this can also increase the bloating.  Drink plenty of fluids but you should avoid alcoholic beverages for 24 hours.  ACTIVITY: Your care partner should take you home directly after the procedure.  You should plan to take it easy, moving slowly for the rest of the day.  You can resume  normal activity the day after the procedure however you should NOT DRIVE or use heavy machinery for 24 hours (because of the sedation medicines used during the test).    SYMPTOMS TO REPORT IMMEDIATELY: A gastroenterologist can be reached at any hour.  During normal business hours, 8:30 AM to 5:00 PM Monday through Friday, call [phone].  After hours and on weekends, please call the GI answering service at [phone] who will take a message and have the physician on call contact you.   Following lower endoscopy (colonoscopy or flexible sigmoidoscopy):  Excessive amounts of blood in the stool  Significant tenderness or worsening of abdominal pains  Swelling of the abdomen that is new, acute  Fever of 100F or higher  FOLLOW UP: If any biopsies were taken you will be contacted by phone or by letter within the next 1-3 weeks.  Call your gastroenterologist if you have not heard about the biopsies in 3 weeks.  Our staff will call the home number listed on your records the next business day following your procedure to check on you and address any questions or concerns that you may have at that time regarding the information given to you following your procedure. This is a courtesy call and so if there is no answer at the home number and we have not heard from you through the emergency physician on call, we will assume that you have returned to  your regular daily activities without incident.  SIGNATURES/CONFIDENTIALITY: You and/or your care partner have signed paperwork which will be entered into your electronic medical record.  These signatures attest to the fact that that the information above on your After Visit Summary has been reviewed and is understood.  Full responsibility of the confidentiality of this discharge information lies with you and/or your care-partner. 

## 2014-05-01 NOTE — Op Note (Signed)
[place]  [address] [place], [id number]   COLONOSCOPY PROCEDURE REPORT  PATIENT: Susan Bell, Susan Bell  MR#: [id number] BIRTHDATE: 01/27/1941 , 74  yrs. old GENDER: female ENDOSCOPIST: Eustace Quail, MD REFERRED PZ:[id number] Recall, PROCEDURE DATE:  05/01/2014 PROCEDURE:   Colonoscopy with snare polypectomy x 2 First Screening Colonoscopy - Avg.  risk and is 50 yrs.  old or older - No.  Prior Negative Screening - Now for repeat screening. 10 or more years since last screening  History of Adenoma - Now for follow-up colonoscopy & has been > or = to 3 yrs.  N/A  Polyps Removed Today? Yes. ASA CLASS:   Class II INDICATIONS:average risk for colorectal cancer.. Index exam in [place] negative for polyps. MEDICATIONS: Monitored anesthesia care and Propofol 150 mg IV  DESCRIPTION OF PROCEDURE:   After the risks benefits and alternatives of the procedure were thoroughly explained, informed consent was obtained.  The digital rectal exam revealed no abnormalities of the rectum.   The LB [id number]  endoscope was introduced through the anus and advanced to the cecum, which was identified by both the appendix and ileocecal valve. No adverse events experienced.   The quality of the prep was excellent, using MoviPrep  The instrument was then slowly withdrawn as the colon was fully examined.   COLON FINDINGS: Two polyps measuring 4 mm in size were found in the transverse colon and ascending colon.  A polypectomy was performed with a cold snare.  The resection was complete, the polyp tissue was completely retrieved and sent to histology.   There was mild diverticulosis noted in the sigmoid colon.   The examination was otherwise normal.  Retroflexed views revealed no abnormalities. The time to cecum=2 minutes 11 seconds.  Withdrawal time=10 minutes 10 seconds.  The scope was withdrawn and the procedure completed. COMPLICATIONS: There were no immediate  complications.  ENDOSCOPIC IMPRESSION: 1.   Two polyps were found in the transverse and ascending colon; polypectomy was performed with a cold snare 2.   Mild diverticulosis was noted in the sigmoid colon 3.   The examination was otherwise normal  RECOMMENDATIONS: 1. Return to the care of your primary provider.  GI follow up as needed  eSigned:  Eustace Quail, MD 05/01/2014 9:00 AM   cc: Shon Baton, MD and The Patient

## 2014-05-01 NOTE — Progress Notes (Signed)
Called to room to assist during endoscopic procedure.  Patient ID and intended procedure confirmed with present staff. Received instructions for my participation in the procedure from the performing physician.  

## 2014-05-02 ENCOUNTER — Telehealth: Payer: Self-pay | Admitting: *Deleted

## 2014-05-02 NOTE — Telephone Encounter (Signed)
  Follow up Call-  Call back number 05/01/2014  Post procedure Call Back phone  # [phone]  Permission to leave phone message Yes     Patient questions:  Do you have a fever, pain , or abdominal swelling? No. Pain Score  0 *  Have you tolerated food without any problems? Yes.    Have you been able to return to your normal activities? Yes.    Do you have any questions about your discharge instructions: Diet   No. Medications  No. Follow up visit  No.  Do you have questions or concerns about your Care? No.  Actions: * If pain score is 4 or above: No action needed, pain <4.

## 2014-05-06 ENCOUNTER — Encounter: Payer: Self-pay | Admitting: Internal Medicine

## 2014-05-09 ENCOUNTER — Ambulatory Visit (INDEPENDENT_AMBULATORY_CARE_PROVIDER_SITE_OTHER): Payer: Medicare Other | Admitting: Podiatrist

## 2014-05-09 ENCOUNTER — Encounter: Payer: Self-pay | Admitting: Podiatrist

## 2014-05-09 VITALS — BP 134/64 | HR 64 | Resp 12

## 2014-05-09 DIAGNOSIS — M722 Plantar fascial fibromatosis: Secondary | ICD-10-CM | POA: Diagnosis not present

## 2014-05-09 NOTE — Patient Instructions (Signed)
Try massage on the bottom of the foot and heel of the left foot-- you may use a golf ball or tennis ball to help out too!

## 2014-05-13 NOTE — Progress Notes (Signed)
Chief Complaint  Patient presents with  . Plantar Fasciitis    ''lt foot heel is doing better but still sore.''     HPI: Patient is 74 y.o. female who presents today for follow up of plantar fasciitis left. She has received 2 injections thus far and has been using her powerstep inserts and night splint.  She states she has noticed much improvement in the foot overall and still has times it feels sore.     Allergies  Allergen Reactions  . Betadine [Povidone Iodine]     itching    Physical Exam  Neurovascular status is intact and unchanged.  Mild pain on the plantar aspect of the left heel is noted.  Much improved from the previous visit.  Swelling at the attachment of the plantar fascia is also improved.    Assessment: plantar fasciitis left foot-- improving  Plan: recommended massage and stretches to the heel as the foot is still improving.  Elected to hold off on a 3rd injection at today's visit.  She will call if the pain starts to return, otherwise she will be seen back as needed.

## 2014-05-14 DIAGNOSIS — R8299 Other abnormal findings in urine: Secondary | ICD-10-CM | POA: Diagnosis not present

## 2014-05-14 DIAGNOSIS — E785 Hyperlipidemia, unspecified: Secondary | ICD-10-CM | POA: Diagnosis not present

## 2014-05-14 DIAGNOSIS — Z008 Encounter for other general examination: Secondary | ICD-10-CM | POA: Diagnosis not present

## 2014-05-14 DIAGNOSIS — M859 Disorder of bone density and structure, unspecified: Secondary | ICD-10-CM | POA: Diagnosis not present

## 2014-05-14 DIAGNOSIS — E039 Hypothyroidism, unspecified: Secondary | ICD-10-CM | POA: Diagnosis not present

## 2014-05-22 DIAGNOSIS — L309 Dermatitis, unspecified: Secondary | ICD-10-CM | POA: Diagnosis not present

## 2014-05-22 DIAGNOSIS — I7300 Raynaud's syndrome without gangrene: Secondary | ICD-10-CM | POA: Diagnosis not present

## 2014-05-22 DIAGNOSIS — K900 Celiac disease: Secondary | ICD-10-CM | POA: Diagnosis not present

## 2014-05-22 DIAGNOSIS — M722 Plantar fascial fibromatosis: Secondary | ICD-10-CM | POA: Diagnosis not present

## 2014-05-22 DIAGNOSIS — Z0000 Encounter for general adult medical examination without abnormal findings: Secondary | ICD-10-CM | POA: Diagnosis not present

## 2014-05-22 DIAGNOSIS — E785 Hyperlipidemia, unspecified: Secondary | ICD-10-CM | POA: Diagnosis not present

## 2014-05-22 DIAGNOSIS — Z1389 Encounter for screening for other disorder: Secondary | ICD-10-CM | POA: Diagnosis not present

## 2014-05-22 DIAGNOSIS — R030 Elevated blood-pressure reading, without diagnosis of hypertension: Secondary | ICD-10-CM | POA: Diagnosis not present

## 2014-05-22 DIAGNOSIS — M8580 Other specified disorders of bone density and structure, unspecified site: Secondary | ICD-10-CM | POA: Diagnosis not present

## 2014-05-22 DIAGNOSIS — R002 Palpitations: Secondary | ICD-10-CM | POA: Diagnosis not present

## 2014-05-22 DIAGNOSIS — H9190 Unspecified hearing loss, unspecified ear: Secondary | ICD-10-CM | POA: Diagnosis not present

## 2014-05-22 DIAGNOSIS — Z6827 Body mass index (BMI) 27.0-27.9, adult: Secondary | ICD-10-CM | POA: Diagnosis not present

## 2014-05-23 DIAGNOSIS — Z1212 Encounter for screening for malignant neoplasm of rectum: Secondary | ICD-10-CM | POA: Diagnosis not present

## 2014-09-23 ENCOUNTER — Ambulatory Visit (INDEPENDENT_AMBULATORY_CARE_PROVIDER_SITE_OTHER): Payer: Medicare Other

## 2014-09-23 ENCOUNTER — Encounter: Payer: Self-pay | Admitting: Podiatry

## 2014-09-23 ENCOUNTER — Ambulatory Visit (INDEPENDENT_AMBULATORY_CARE_PROVIDER_SITE_OTHER): Payer: Medicare Other | Admitting: Podiatry

## 2014-09-23 VITALS — BP 165/74 | HR 75 | Resp 15

## 2014-09-23 DIAGNOSIS — M79672 Pain in left foot: Secondary | ICD-10-CM

## 2014-09-23 DIAGNOSIS — M779 Enthesopathy, unspecified: Secondary | ICD-10-CM

## 2014-09-23 MED ORDER — TRIAMCINOLONE ACETONIDE 10 MG/ML IJ SUSP
10.0000 mg | Freq: Once | INTRAMUSCULAR | Status: AC
  Administered 2014-09-23: 10 mg

## 2014-09-24 NOTE — Progress Notes (Signed)
Subjective:     Patient ID: Susan Bell, female   DOB: [birth year], 74 y.o.   MRN: [id number]  HPI patient presents stating the outside of her left foot has really been bothering her and her heel seems better but still can be somewhat discomforting. Not sure if she's been walking differently   Review of Systems     Objective:   Physical Exam Neurovascular status intact muscle strength adequate range of motion within normal limits. Patient's noted to have discomfort in the lateral side of the left foot around the peroneal's tertius and peroneal brevis complex. Muscle strength appears to be normal    Assessment:     Probable compensatory pain with inflammation of the peroneal complex    Plan:     Reviewed condition and at this time have recommended careful injection which was administered to the lateral tendon complex and heat and ice therapy. If symptoms persist we'll need to consider MRI

## 2014-09-30 DIAGNOSIS — Z1231 Encounter for screening mammogram for malignant neoplasm of breast: Secondary | ICD-10-CM | POA: Diagnosis not present

## 2014-10-01 ENCOUNTER — Ambulatory Visit (INDEPENDENT_AMBULATORY_CARE_PROVIDER_SITE_OTHER): Payer: Medicare Other | Admitting: Podiatry

## 2014-10-01 ENCOUNTER — Encounter: Payer: Self-pay | Admitting: Podiatry

## 2014-10-01 VITALS — BP 140/76 | HR 54 | Resp 15

## 2014-10-01 DIAGNOSIS — M722 Plantar fascial fibromatosis: Secondary | ICD-10-CM

## 2014-10-01 DIAGNOSIS — M779 Enthesopathy, unspecified: Secondary | ICD-10-CM

## 2014-10-01 MED ORDER — DIAZEPAM 5 MG PO TABS: 5.0000 mg | ORAL_TABLET | Freq: Two times a day (BID) | ORAL | Status: DC | PRN

## 2014-10-02 NOTE — Progress Notes (Signed)
Subjective:     Patient ID: Susan Bell, female   DOB: May 21, 1940, 74 y.o.   MRN: [id number]  HPI patient states my foot is feeling some better were you worked on it but I feel like it's in a constant craft and I get pain in my arch pain in my mid foot and I am not able to bear weight on it especially after sitting or laying down   Review of Systems     Objective:   Physical Exam Neurovascular status intact no change in health history with discomfort in the mid arch area left and into the lateral side of the foot    Assessment:     Inflammatory changes consistent with probable change in gait causing inflammation pain and cramping    Plan:     H&P performed condition reviewed and I've recommended night splint in order to stretch along with aggressive heat therapy and soaks. Also placed on Valium 5 mg twice a day to reduce the cramping and reappoint if symptoms persist over the next few weeks

## 2014-10-08 ENCOUNTER — Ambulatory Visit (INDEPENDENT_AMBULATORY_CARE_PROVIDER_SITE_OTHER): Payer: Medicare Other

## 2014-10-08 ENCOUNTER — Ambulatory Visit (INDEPENDENT_AMBULATORY_CARE_PROVIDER_SITE_OTHER): Payer: Medicare Other | Admitting: Podiatry

## 2014-10-08 ENCOUNTER — Encounter: Payer: Self-pay | Admitting: Podiatry

## 2014-10-08 VITALS — BP 148/76 | HR 55 | Resp 15

## 2014-10-08 DIAGNOSIS — M722 Plantar fascial fibromatosis: Secondary | ICD-10-CM | POA: Diagnosis not present

## 2014-10-08 DIAGNOSIS — M8430XA Stress fracture, unspecified site, initial encounter for fracture: Secondary | ICD-10-CM | POA: Diagnosis not present

## 2014-10-08 NOTE — Progress Notes (Signed)
Subjective:     Patient ID: Susan Bell, female   DOB: 21-Sep-1940, 74 y.o.   MRN: [id number]  HPI patient states my left foot started to get a lot worse on Sunday and I do not remember what I may have done. It hurts in the midfoot and it has been swelling   Review of Systems     Objective:   Physical Exam Neurovascular status unchanged with negative Homans sign noted and good range of motion. Patient has quite a bit of forefoot pain noted left with inflammation and fluid in the second metatarsal proximal shaft with no indication of problems with the calf muscle or other issues    Assessment:     Possible stress fracture versus tendinitis left forefoot    Plan:     Reviewed x-rays indicating fracture and explained him mobilization reduced activity and ice. Dispensed air fracture walker with instructions on usage and reappoint to reevaluate in 4 weeks or earlier if any issues should occur fracture of second metatarsal base noted

## 2014-11-05 ENCOUNTER — Ambulatory Visit (INDEPENDENT_AMBULATORY_CARE_PROVIDER_SITE_OTHER): Payer: Medicare Other | Admitting: Podiatry

## 2014-11-05 ENCOUNTER — Ambulatory Visit (INDEPENDENT_AMBULATORY_CARE_PROVIDER_SITE_OTHER): Payer: Medicare Other

## 2014-11-05 ENCOUNTER — Encounter: Payer: Self-pay | Admitting: Podiatry

## 2014-11-05 VITALS — BP 165/78 | HR 58 | Resp 15

## 2014-11-05 DIAGNOSIS — M84375D Stress fracture, left foot, subsequent encounter for fracture with routine healing: Secondary | ICD-10-CM

## 2014-11-05 DIAGNOSIS — M79672 Pain in left foot: Secondary | ICD-10-CM

## 2014-11-05 NOTE — Progress Notes (Signed)
Subjective:     Patient ID: Susan Bell, female   DOB: 13-Jan-1941, 74 y.o.   MRN: [id number]  HPI patient presents stating it is some better but I have a lot of problems when I don't wear my boot. I'm still getting quite a bit of swelling   Review of Systems     Objective:   Physical Exam Neurovascular status intact muscle strength adequate with discomfort in the dorsum of the left foot around the base of the second metatarsal with fluid buildup and pain when I palpated the area    Assessment:     Stress fracture second metatarsal left that's healing but is still quite symptomatic    Plan:     Reviewed x-rays with patient and at this time I placed in a continued air fracture walker with gradual reduction over the next 4 weeks. Explain where the stress fracture is a will probably take another 4-6 weeks to heal and reappoint in 4 weeks

## 2014-11-24 DIAGNOSIS — M859 Disorder of bone density and structure, unspecified: Secondary | ICD-10-CM | POA: Diagnosis not present

## 2014-11-24 DIAGNOSIS — M722 Plantar fascial fibromatosis: Secondary | ICD-10-CM | POA: Diagnosis not present

## 2014-11-24 DIAGNOSIS — R030 Elevated blood-pressure reading, without diagnosis of hypertension: Secondary | ICD-10-CM | POA: Diagnosis not present

## 2014-11-24 DIAGNOSIS — M79672 Pain in left foot: Secondary | ICD-10-CM | POA: Diagnosis not present

## 2014-11-24 DIAGNOSIS — Z6827 Body mass index (BMI) 27.0-27.9, adult: Secondary | ICD-10-CM | POA: Diagnosis not present

## 2014-11-24 DIAGNOSIS — E039 Hypothyroidism, unspecified: Secondary | ICD-10-CM | POA: Diagnosis not present

## 2014-11-24 DIAGNOSIS — E559 Vitamin D deficiency, unspecified: Secondary | ICD-10-CM | POA: Diagnosis not present

## 2014-11-24 DIAGNOSIS — E785 Hyperlipidemia, unspecified: Secondary | ICD-10-CM | POA: Diagnosis not present

## 2014-11-28 DIAGNOSIS — L723 Sebaceous cyst: Secondary | ICD-10-CM | POA: Diagnosis not present

## 2014-11-28 DIAGNOSIS — L821 Other seborrheic keratosis: Secondary | ICD-10-CM | POA: Diagnosis not present

## 2014-11-28 DIAGNOSIS — D692 Other nonthrombocytopenic purpura: Secondary | ICD-10-CM | POA: Diagnosis not present

## 2014-11-28 DIAGNOSIS — D2271 Melanocytic nevi of right lower limb, including hip: Secondary | ICD-10-CM | POA: Diagnosis not present

## 2014-12-03 ENCOUNTER — Encounter: Payer: Self-pay | Admitting: Podiatry

## 2014-12-03 ENCOUNTER — Ambulatory Visit (INDEPENDENT_AMBULATORY_CARE_PROVIDER_SITE_OTHER): Payer: Medicare Other | Admitting: Podiatry

## 2014-12-03 ENCOUNTER — Ambulatory Visit (INDEPENDENT_AMBULATORY_CARE_PROVIDER_SITE_OTHER): Payer: Medicare Other

## 2014-12-03 VITALS — BP 139/58 | HR 60 | Resp 16

## 2014-12-03 DIAGNOSIS — M722 Plantar fascial fibromatosis: Secondary | ICD-10-CM

## 2014-12-03 DIAGNOSIS — M84375D Stress fracture, left foot, subsequent encounter for fracture with routine healing: Secondary | ICD-10-CM

## 2014-12-03 DIAGNOSIS — M779 Enthesopathy, unspecified: Secondary | ICD-10-CM

## 2014-12-03 MED ORDER — DICLOFENAC SODIUM 75 MG PO TBEC: 75.0000 mg | DELAYED_RELEASE_TABLET | Freq: Two times a day (BID) | ORAL | Status: DC

## 2014-12-03 NOTE — Progress Notes (Signed)
Subjective:     Patient ID: Susan Bell, female   DOB: 01-Feb-1941, 74 y.o.   MRN: [id number]  HPI patient states I'm still having pain in my left foot and it feels like I'm walking differently and that could be part of the problem   Review of Systems     Objective:   Physical Exam Neurovascular status intact muscle strength was adequate with continued swelling around the proximal portion fourth metatarsal with mild changes within the ankle itself and discomfort when palpated    Assessment:     Stress fracture which is healing left with continuation of compensatory gait leading to tendinitis-like symptoms    Plan:     Reviewed condition and recommended continued compression which was applied today along with boot and supportive shoe gear usage. Reappoint for [place] to recheck

## 2014-12-24 ENCOUNTER — Ambulatory Visit (INDEPENDENT_AMBULATORY_CARE_PROVIDER_SITE_OTHER): Payer: Medicare Other | Admitting: Podiatry

## 2014-12-24 ENCOUNTER — Ambulatory Visit (INDEPENDENT_AMBULATORY_CARE_PROVIDER_SITE_OTHER): Payer: Medicare Other

## 2014-12-24 ENCOUNTER — Encounter: Payer: Self-pay | Admitting: Podiatry

## 2014-12-24 VITALS — BP 139/68 | HR 56 | Resp 16

## 2014-12-24 DIAGNOSIS — L600 Ingrowing nail: Secondary | ICD-10-CM

## 2014-12-24 DIAGNOSIS — B351 Tinea unguium: Secondary | ICD-10-CM

## 2014-12-24 DIAGNOSIS — M8430XD Stress fracture, unspecified site, subsequent encounter for fracture with routine healing: Secondary | ICD-10-CM

## 2014-12-24 NOTE — Progress Notes (Signed)
Subjective:     Patient ID: Susan Bell, female   DOB: 1941/03/02, 74 y.o.   MRN: [id number]  HPI patient presents concerned about discoloration of the left second nail and discomfort on the dorsum of the foot that is minimal in its nature but still present with prolonged walking   Review of Systems     Objective:   Physical Exam Neurovascular status intact muscle strength adequate range of motion within normal limits with patient having on the dorsum of the left foot mild inflammation that continues to improve and discoloration of the second nail left and slightly hallux nail right    Assessment:     Stress fracture left which continues to improve with tendinitis-like symptomatology and also nail disease bilateral that is localized with no proximal spread    Plan:     Patient was evaluated and found to have mild edema dorsum left foot localized in nature with no indications of worsening of symptoms and nail disease left second toe right hallux localized in nature. I went ahead and reviewed her x-rays and allow her to return to normal activity and she will monitor her nails and may require treatment if symptoms were to persist or get worse

## 2014-12-25 DIAGNOSIS — Z961 Presence of intraocular lens: Secondary | ICD-10-CM | POA: Diagnosis not present

## 2014-12-25 DIAGNOSIS — H1851 Endothelial corneal dystrophy: Secondary | ICD-10-CM | POA: Diagnosis not present

## 2014-12-27 DIAGNOSIS — Z23 Encounter for immunization: Secondary | ICD-10-CM | POA: Diagnosis not present

## 2015-04-24 DIAGNOSIS — H16103 Unspecified superficial keratitis, bilateral: Secondary | ICD-10-CM | POA: Diagnosis not present

## 2015-04-24 DIAGNOSIS — H10413 Chronic giant papillary conjunctivitis, bilateral: Secondary | ICD-10-CM | POA: Diagnosis not present

## 2015-05-04 DIAGNOSIS — H10413 Chronic giant papillary conjunctivitis, bilateral: Secondary | ICD-10-CM | POA: Diagnosis not present

## 2015-05-18 DIAGNOSIS — N390 Urinary tract infection, site not specified: Secondary | ICD-10-CM | POA: Diagnosis not present

## 2015-05-18 DIAGNOSIS — D6489 Other specified anemias: Secondary | ICD-10-CM | POA: Diagnosis not present

## 2015-05-18 DIAGNOSIS — R8299 Other abnormal findings in urine: Secondary | ICD-10-CM | POA: Diagnosis not present

## 2015-05-18 DIAGNOSIS — E559 Vitamin D deficiency, unspecified: Secondary | ICD-10-CM | POA: Diagnosis not present

## 2015-05-18 DIAGNOSIS — E038 Other specified hypothyroidism: Secondary | ICD-10-CM | POA: Diagnosis not present

## 2015-05-18 DIAGNOSIS — M859 Disorder of bone density and structure, unspecified: Secondary | ICD-10-CM | POA: Diagnosis not present

## 2015-05-18 DIAGNOSIS — E784 Other hyperlipidemia: Secondary | ICD-10-CM | POA: Diagnosis not present

## 2015-05-19 DIAGNOSIS — D649 Anemia, unspecified: Secondary | ICD-10-CM | POA: Diagnosis not present

## 2015-05-25 DIAGNOSIS — D649 Anemia, unspecified: Secondary | ICD-10-CM | POA: Diagnosis not present

## 2015-05-25 DIAGNOSIS — Z0000 Encounter for general adult medical examination without abnormal findings: Secondary | ICD-10-CM | POA: Diagnosis not present

## 2015-05-25 DIAGNOSIS — E038 Other specified hypothyroidism: Secondary | ICD-10-CM | POA: Diagnosis not present

## 2015-05-25 DIAGNOSIS — E559 Vitamin D deficiency, unspecified: Secondary | ICD-10-CM | POA: Diagnosis not present

## 2015-05-25 DIAGNOSIS — E784 Other hyperlipidemia: Secondary | ICD-10-CM | POA: Diagnosis not present

## 2015-05-25 DIAGNOSIS — L309 Dermatitis, unspecified: Secondary | ICD-10-CM | POA: Diagnosis not present

## 2015-05-25 DIAGNOSIS — Z6827 Body mass index (BMI) 27.0-27.9, adult: Secondary | ICD-10-CM | POA: Diagnosis not present

## 2015-05-25 DIAGNOSIS — Z1389 Encounter for screening for other disorder: Secondary | ICD-10-CM | POA: Diagnosis not present

## 2015-05-25 DIAGNOSIS — K900 Celiac disease: Secondary | ICD-10-CM | POA: Diagnosis not present

## 2015-05-25 DIAGNOSIS — I7300 Raynaud's syndrome without gangrene: Secondary | ICD-10-CM | POA: Diagnosis not present

## 2015-05-25 DIAGNOSIS — R030 Elevated blood-pressure reading, without diagnosis of hypertension: Secondary | ICD-10-CM | POA: Diagnosis not present

## 2015-05-25 DIAGNOSIS — M79672 Pain in left foot: Secondary | ICD-10-CM | POA: Diagnosis not present

## 2015-06-12 DIAGNOSIS — Z1212 Encounter for screening for malignant neoplasm of rectum: Secondary | ICD-10-CM | POA: Diagnosis not present

## 2015-06-29 ENCOUNTER — Encounter: Payer: Self-pay | Admitting: Podiatry

## 2015-06-29 ENCOUNTER — Ambulatory Visit (INDEPENDENT_AMBULATORY_CARE_PROVIDER_SITE_OTHER): Payer: Medicare Other | Admitting: Podiatry

## 2015-06-29 ENCOUNTER — Ambulatory Visit (INDEPENDENT_AMBULATORY_CARE_PROVIDER_SITE_OTHER): Payer: Medicare Other

## 2015-06-29 VITALS — BP 119/56 | HR 66 | Resp 16

## 2015-06-29 DIAGNOSIS — M79672 Pain in left foot: Secondary | ICD-10-CM

## 2015-06-29 DIAGNOSIS — M79671 Pain in right foot: Secondary | ICD-10-CM

## 2015-06-29 DIAGNOSIS — M722 Plantar fascial fibromatosis: Secondary | ICD-10-CM | POA: Diagnosis not present

## 2015-06-29 DIAGNOSIS — L723 Sebaceous cyst: Secondary | ICD-10-CM | POA: Diagnosis not present

## 2015-06-29 DIAGNOSIS — L820 Inflamed seborrheic keratosis: Secondary | ICD-10-CM | POA: Diagnosis not present

## 2015-06-29 MED ORDER — TRIAMCINOLONE ACETONIDE 10 MG/ML IJ SUSP
10.0000 mg | Freq: Once | INTRAMUSCULAR | Status: AC
  Administered 2015-06-29: 10 mg

## 2015-06-29 MED ORDER — DICLOFENAC SODIUM 75 MG PO TBEC: 75.0000 mg | DELAYED_RELEASE_TABLET | Freq: Two times a day (BID) | ORAL | Status: DC

## 2015-06-29 NOTE — Patient Instructions (Signed)

## 2015-06-30 NOTE — Progress Notes (Signed)
Subjective:     Patient ID: Susan Bell, female   DOB: 01/31/1941, 75 y.o.   MRN: [id number]  HPI patient presents stating she's having a lot of pain in the bottom of both her heels with the left being worse than the right. States it's been present for a number of months   Review of Systems  All other systems reviewed and are negative.      Objective:   Physical Exam  Constitutional: She is oriented to person, place, and time.  Cardiovascular: Intact distal pulses.   Musculoskeletal: Normal range of motion.  Neurological: She is oriented to person, place, and time.  Skin: Skin is warm.  Nursing note and vitals reviewed.  neurovascular status intact muscle strength adequate range of motion within normal limits with patient found to have exquisite discomfort medial fascial band left over right with fluid buildup around the medial band. Patient states it's very tender when pressed and making it hard to walk and she is found to have good digital perfusion and is well oriented 3     Assessment:     Acute plantar fasciitis bilateral heel    Plan:     H&P and x-rays reviewed with patient. I injected the plantar fascia bilateral 3 mg Kenalog 5 mill grams Xylocaine and applied fascial brace bilateral and gave instructions on physical therapy and shoe gear modifications. Reappoint in 2 weeks or earlier if needed

## 2015-07-13 ENCOUNTER — Ambulatory Visit (INDEPENDENT_AMBULATORY_CARE_PROVIDER_SITE_OTHER): Payer: Medicare Other | Admitting: Podiatry

## 2015-07-13 ENCOUNTER — Encounter: Payer: Self-pay | Admitting: Podiatry

## 2015-07-13 DIAGNOSIS — L600 Ingrowing nail: Secondary | ICD-10-CM | POA: Diagnosis not present

## 2015-07-13 DIAGNOSIS — M722 Plantar fascial fibromatosis: Secondary | ICD-10-CM

## 2015-07-13 NOTE — Patient Instructions (Signed)

## 2015-07-14 NOTE — Progress Notes (Signed)
Subjective:     Patient ID: Susan Bell, female   DOB: 1940/07/10, 75 y.o.   MRN: [id number]  HPI patient presents stating my feet are feeling better in the heels but I'm having a lot of pain in his third toe with an ingrown toenail that I cannot take care of and I've tried to trim it and soak it   Review of Systems     Objective:   Physical Exam Neurovascular status intact muscle strength adequate with incurvated third nail right lateral side that's painful when pressed and makes wearing shoe gear difficult. Heels are improving with mild discomfort when palpated    Assessment:     Doing better post plantar fasciitis bilateral with ingrown toenail deformity right third toe    Plan:     Reviewed condition and discussed treatment options. At this point it's been recommended that removal of the ingrown toenail be accomplished and patient wants this procedure. I explained the procedure and risk and today I infiltrated the right third toe 60 mg Xylocaine Marcaine mixture removed the nail border exposed matrix and applied phenol 3 applications 30 seconds followed by alcohol lavage and sterile dressing. Gave instructions on soaks and reappoint and continue with supportive shoes for the heels with stretching exercises

## 2015-07-20 DIAGNOSIS — E784 Other hyperlipidemia: Secondary | ICD-10-CM | POA: Diagnosis not present

## 2015-07-21 ENCOUNTER — Telehealth: Payer: Self-pay | Admitting: *Deleted

## 2015-07-21 NOTE — Telephone Encounter (Signed)
Called patient at [phone] (Home #) to check to see how they were doing from their ingrown toenail procedure that was performed on Monday, July 13, 2015. Pt stated, "last week nail felt fine. Yesterday, noticed a red-looking color on side of nail where Dr. Paulla Dolly treated. Nail is still tender, but does not hurt". Pt is soaking toe with some relief and getting some air on toe for healing of nail. I advised patient to call our office back if they see any type of infection on nail, i.e., pus coming out or if they have any further questions or concerns. Pt stated they understood.

## 2015-07-28 DIAGNOSIS — D1801 Hemangioma of skin and subcutaneous tissue: Secondary | ICD-10-CM | POA: Diagnosis not present

## 2015-07-28 DIAGNOSIS — L821 Other seborrheic keratosis: Secondary | ICD-10-CM | POA: Diagnosis not present

## 2015-07-28 DIAGNOSIS — D2271 Melanocytic nevi of right lower limb, including hip: Secondary | ICD-10-CM | POA: Diagnosis not present

## 2015-07-28 DIAGNOSIS — D485 Neoplasm of uncertain behavior of skin: Secondary | ICD-10-CM | POA: Diagnosis not present

## 2015-07-28 DIAGNOSIS — L304 Erythema intertrigo: Secondary | ICD-10-CM | POA: Diagnosis not present

## 2015-07-28 DIAGNOSIS — L298 Other pruritus: Secondary | ICD-10-CM | POA: Diagnosis not present

## 2015-07-28 DIAGNOSIS — D225 Melanocytic nevi of trunk: Secondary | ICD-10-CM | POA: Diagnosis not present

## 2015-07-28 DIAGNOSIS — L570 Actinic keratosis: Secondary | ICD-10-CM | POA: Diagnosis not present

## 2015-07-28 DIAGNOSIS — C44319 Basal cell carcinoma of skin of other parts of face: Secondary | ICD-10-CM | POA: Diagnosis not present

## 2015-08-20 DIAGNOSIS — Z85828 Personal history of other malignant neoplasm of skin: Secondary | ICD-10-CM | POA: Diagnosis not present

## 2015-08-20 DIAGNOSIS — C44319 Basal cell carcinoma of skin of other parts of face: Secondary | ICD-10-CM | POA: Diagnosis not present

## 2015-10-02 DIAGNOSIS — Z1231 Encounter for screening mammogram for malignant neoplasm of breast: Secondary | ICD-10-CM | POA: Diagnosis not present

## 2015-11-23 DIAGNOSIS — K900 Celiac disease: Secondary | ICD-10-CM | POA: Diagnosis not present

## 2015-11-23 DIAGNOSIS — E039 Hypothyroidism, unspecified: Secondary | ICD-10-CM | POA: Diagnosis not present

## 2015-11-23 DIAGNOSIS — R030 Elevated blood-pressure reading, without diagnosis of hypertension: Secondary | ICD-10-CM | POA: Diagnosis not present

## 2015-11-23 DIAGNOSIS — Z6827 Body mass index (BMI) 27.0-27.9, adult: Secondary | ICD-10-CM | POA: Diagnosis not present

## 2015-11-23 DIAGNOSIS — M79672 Pain in left foot: Secondary | ICD-10-CM | POA: Diagnosis not present

## 2015-11-23 DIAGNOSIS — E785 Hyperlipidemia, unspecified: Secondary | ICD-10-CM | POA: Diagnosis not present

## 2015-11-23 DIAGNOSIS — F325 Major depressive disorder, single episode, in full remission: Secondary | ICD-10-CM | POA: Diagnosis not present

## 2015-12-19 DIAGNOSIS — Z23 Encounter for immunization: Secondary | ICD-10-CM | POA: Diagnosis not present

## 2016-02-08 DIAGNOSIS — Z6827 Body mass index (BMI) 27.0-27.9, adult: Secondary | ICD-10-CM | POA: Diagnosis not present

## 2016-02-08 DIAGNOSIS — M791 Myalgia: Secondary | ICD-10-CM | POA: Diagnosis not present

## 2016-02-23 DIAGNOSIS — H16223 Keratoconjunctivitis sicca, not specified as Sjogren's, bilateral: Secondary | ICD-10-CM | POA: Diagnosis not present

## 2016-02-23 DIAGNOSIS — H353131 Nonexudative age-related macular degeneration, bilateral, early dry stage: Secondary | ICD-10-CM | POA: Diagnosis not present

## 2016-02-23 DIAGNOSIS — H11011 Amyloid pterygium of right eye: Secondary | ICD-10-CM | POA: Diagnosis not present

## 2016-02-23 DIAGNOSIS — H1851 Endothelial corneal dystrophy: Secondary | ICD-10-CM | POA: Diagnosis not present

## 2016-03-02 DIAGNOSIS — M25512 Pain in left shoulder: Secondary | ICD-10-CM | POA: Diagnosis not present

## 2016-03-02 DIAGNOSIS — M7542 Impingement syndrome of left shoulder: Secondary | ICD-10-CM | POA: Diagnosis not present

## 2016-03-21 DIAGNOSIS — F325 Major depressive disorder, single episode, in full remission: Secondary | ICD-10-CM | POA: Diagnosis not present

## 2016-04-05 DIAGNOSIS — F325 Major depressive disorder, single episode, in full remission: Secondary | ICD-10-CM | POA: Diagnosis not present

## 2016-04-13 DIAGNOSIS — H109 Unspecified conjunctivitis: Secondary | ICD-10-CM | POA: Diagnosis not present

## 2016-04-15 DIAGNOSIS — B308 Other viral conjunctivitis: Secondary | ICD-10-CM | POA: Diagnosis not present

## 2016-04-22 DIAGNOSIS — B308 Other viral conjunctivitis: Secondary | ICD-10-CM | POA: Diagnosis not present

## 2016-04-27 DIAGNOSIS — M25561 Pain in right knee: Secondary | ICD-10-CM | POA: Diagnosis not present

## 2016-04-27 DIAGNOSIS — M7542 Impingement syndrome of left shoulder: Secondary | ICD-10-CM | POA: Diagnosis not present

## 2016-05-06 DIAGNOSIS — H1851 Endothelial corneal dystrophy: Secondary | ICD-10-CM | POA: Diagnosis not present

## 2016-05-06 DIAGNOSIS — H40013 Open angle with borderline findings, low risk, bilateral: Secondary | ICD-10-CM | POA: Diagnosis not present

## 2016-05-06 DIAGNOSIS — H04123 Dry eye syndrome of bilateral lacrimal glands: Secondary | ICD-10-CM | POA: Diagnosis not present

## 2016-05-06 DIAGNOSIS — H353131 Nonexudative age-related macular degeneration, bilateral, early dry stage: Secondary | ICD-10-CM | POA: Diagnosis not present

## 2016-05-06 DIAGNOSIS — B308 Other viral conjunctivitis: Secondary | ICD-10-CM | POA: Diagnosis not present

## 2016-05-09 DIAGNOSIS — F325 Major depressive disorder, single episode, in full remission: Secondary | ICD-10-CM | POA: Diagnosis not present

## 2016-05-20 DIAGNOSIS — E038 Other specified hypothyroidism: Secondary | ICD-10-CM | POA: Diagnosis not present

## 2016-05-20 DIAGNOSIS — M859 Disorder of bone density and structure, unspecified: Secondary | ICD-10-CM | POA: Diagnosis not present

## 2016-05-20 DIAGNOSIS — E784 Other hyperlipidemia: Secondary | ICD-10-CM | POA: Diagnosis not present

## 2016-05-27 DIAGNOSIS — Z1389 Encounter for screening for other disorder: Secondary | ICD-10-CM | POA: Diagnosis not present

## 2016-05-27 DIAGNOSIS — L308 Other specified dermatitis: Secondary | ICD-10-CM | POA: Diagnosis not present

## 2016-05-27 DIAGNOSIS — M859 Disorder of bone density and structure, unspecified: Secondary | ICD-10-CM | POA: Diagnosis not present

## 2016-05-27 DIAGNOSIS — R030 Elevated blood-pressure reading, without diagnosis of hypertension: Secondary | ICD-10-CM | POA: Diagnosis not present

## 2016-05-27 DIAGNOSIS — R0789 Other chest pain: Secondary | ICD-10-CM | POA: Diagnosis not present

## 2016-05-27 DIAGNOSIS — F325 Major depressive disorder, single episode, in full remission: Secondary | ICD-10-CM | POA: Diagnosis not present

## 2016-05-27 DIAGNOSIS — E559 Vitamin D deficiency, unspecified: Secondary | ICD-10-CM | POA: Diagnosis not present

## 2016-05-27 DIAGNOSIS — E784 Other hyperlipidemia: Secondary | ICD-10-CM | POA: Diagnosis not present

## 2016-05-27 DIAGNOSIS — Z0000 Encounter for general adult medical examination without abnormal findings: Secondary | ICD-10-CM | POA: Diagnosis not present

## 2016-05-27 DIAGNOSIS — E038 Other specified hypothyroidism: Secondary | ICD-10-CM | POA: Diagnosis not present

## 2016-05-27 DIAGNOSIS — Z23 Encounter for immunization: Secondary | ICD-10-CM | POA: Diagnosis not present

## 2016-05-27 DIAGNOSIS — Z6827 Body mass index (BMI) 27.0-27.9, adult: Secondary | ICD-10-CM | POA: Diagnosis not present

## 2016-06-01 DIAGNOSIS — Z1212 Encounter for screening for malignant neoplasm of rectum: Secondary | ICD-10-CM | POA: Diagnosis not present

## 2016-06-20 ENCOUNTER — Encounter: Payer: Self-pay | Admitting: Podiatry

## 2016-06-20 ENCOUNTER — Ambulatory Visit (INDEPENDENT_AMBULATORY_CARE_PROVIDER_SITE_OTHER): Payer: Medicare Other | Admitting: Podiatry

## 2016-06-20 DIAGNOSIS — L600 Ingrowing nail: Secondary | ICD-10-CM

## 2016-06-20 DIAGNOSIS — B351 Tinea unguium: Secondary | ICD-10-CM | POA: Diagnosis not present

## 2016-06-20 NOTE — Patient Instructions (Signed)

## 2016-06-22 NOTE — Progress Notes (Signed)
Subjective:     Patient ID: Susan Bell, female   DOB: Feb 08, 1941, 76 y.o.   MRN: [id number]  HPI patient presents stating she has several nails that are ingrown but the second right is the worse one for her now. States that she's tried soaks trimming without relief and it's been present for several months   Review of Systems     Objective:   Physical Exam Neurovascular status intact with patient found to have incurvated second nail right lateral border with pain and is noted to have thickness of remaining nails that she has numerous questions concerning    Assessment:     Ingrown toenail deformity right second toe with thickness of nailbeds consistent with fungal infiltration    Plan:     Discussed both conditions and at this point were to focus on the nail with consideration for oral or topical or possible laser therapy for remaining nails. I infiltrated the right second toe 60 ms Mark mixture remove border exposed matrix and applied phenol 3 applications 30 seconds followed by alcohol lavage and sterile dressing. Gave instructions on soaks

## 2016-08-10 DIAGNOSIS — M25562 Pain in left knee: Secondary | ICD-10-CM | POA: Diagnosis not present

## 2016-08-10 DIAGNOSIS — M25561 Pain in right knee: Secondary | ICD-10-CM | POA: Diagnosis not present

## 2016-08-31 DIAGNOSIS — D485 Neoplasm of uncertain behavior of skin: Secondary | ICD-10-CM | POA: Diagnosis not present

## 2016-08-31 DIAGNOSIS — L821 Other seborrheic keratosis: Secondary | ICD-10-CM | POA: Diagnosis not present

## 2016-08-31 DIAGNOSIS — L570 Actinic keratosis: Secondary | ICD-10-CM | POA: Diagnosis not present

## 2016-08-31 DIAGNOSIS — Z85828 Personal history of other malignant neoplasm of skin: Secondary | ICD-10-CM | POA: Diagnosis not present

## 2016-08-31 DIAGNOSIS — D3613 Benign neoplasm of peripheral nerves and autonomic nervous system of lower limb, including hip: Secondary | ICD-10-CM | POA: Diagnosis not present

## 2016-08-31 DIAGNOSIS — L720 Epidermal cyst: Secondary | ICD-10-CM | POA: Diagnosis not present

## 2016-08-31 DIAGNOSIS — D225 Melanocytic nevi of trunk: Secondary | ICD-10-CM | POA: Diagnosis not present

## 2016-10-03 DIAGNOSIS — Z1231 Encounter for screening mammogram for malignant neoplasm of breast: Secondary | ICD-10-CM | POA: Diagnosis not present

## 2016-11-23 DIAGNOSIS — M25561 Pain in right knee: Secondary | ICD-10-CM | POA: Diagnosis not present

## 2016-11-23 DIAGNOSIS — M7542 Impingement syndrome of left shoulder: Secondary | ICD-10-CM | POA: Diagnosis not present

## 2016-11-28 DIAGNOSIS — R030 Elevated blood-pressure reading, without diagnosis of hypertension: Secondary | ICD-10-CM | POA: Diagnosis not present

## 2016-11-28 DIAGNOSIS — R5383 Other fatigue: Secondary | ICD-10-CM | POA: Diagnosis not present

## 2016-11-28 DIAGNOSIS — R351 Nocturia: Secondary | ICD-10-CM | POA: Diagnosis not present

## 2016-11-28 DIAGNOSIS — E559 Vitamin D deficiency, unspecified: Secondary | ICD-10-CM | POA: Diagnosis not present

## 2016-11-28 DIAGNOSIS — D649 Anemia, unspecified: Secondary | ICD-10-CM | POA: Diagnosis not present

## 2016-11-28 DIAGNOSIS — Z6827 Body mass index (BMI) 27.0-27.9, adult: Secondary | ICD-10-CM | POA: Diagnosis not present

## 2016-11-28 DIAGNOSIS — R0789 Other chest pain: Secondary | ICD-10-CM | POA: Diagnosis not present

## 2016-11-28 DIAGNOSIS — F325 Major depressive disorder, single episode, in full remission: Secondary | ICD-10-CM | POA: Diagnosis not present

## 2016-11-28 DIAGNOSIS — E038 Other specified hypothyroidism: Secondary | ICD-10-CM | POA: Diagnosis not present

## 2016-11-28 DIAGNOSIS — D692 Other nonthrombocytopenic purpura: Secondary | ICD-10-CM | POA: Diagnosis not present

## 2016-11-28 DIAGNOSIS — M1990 Unspecified osteoarthritis, unspecified site: Secondary | ICD-10-CM | POA: Diagnosis not present

## 2016-11-28 DIAGNOSIS — K900 Celiac disease: Secondary | ICD-10-CM | POA: Diagnosis not present

## 2016-12-01 DIAGNOSIS — M25512 Pain in left shoulder: Secondary | ICD-10-CM | POA: Diagnosis not present

## 2016-12-08 DIAGNOSIS — M25512 Pain in left shoulder: Secondary | ICD-10-CM | POA: Diagnosis not present

## 2016-12-08 DIAGNOSIS — M19012 Primary osteoarthritis, left shoulder: Secondary | ICD-10-CM | POA: Diagnosis not present

## 2016-12-08 DIAGNOSIS — G8929 Other chronic pain: Secondary | ICD-10-CM | POA: Diagnosis not present

## 2016-12-19 DIAGNOSIS — H10413 Chronic giant papillary conjunctivitis, bilateral: Secondary | ICD-10-CM | POA: Diagnosis not present

## 2016-12-19 DIAGNOSIS — H0014 Chalazion left upper eyelid: Secondary | ICD-10-CM | POA: Diagnosis not present

## 2016-12-26 DIAGNOSIS — R05 Cough: Secondary | ICD-10-CM | POA: Diagnosis not present

## 2016-12-26 DIAGNOSIS — Z6827 Body mass index (BMI) 27.0-27.9, adult: Secondary | ICD-10-CM | POA: Diagnosis not present

## 2016-12-26 DIAGNOSIS — I10 Essential (primary) hypertension: Secondary | ICD-10-CM | POA: Diagnosis not present

## 2016-12-26 DIAGNOSIS — Z23 Encounter for immunization: Secondary | ICD-10-CM | POA: Diagnosis not present

## 2017-01-16 DIAGNOSIS — H10413 Chronic giant papillary conjunctivitis, bilateral: Secondary | ICD-10-CM | POA: Diagnosis not present

## 2017-01-16 DIAGNOSIS — H0014 Chalazion left upper eyelid: Secondary | ICD-10-CM | POA: Diagnosis not present

## 2017-01-17 DIAGNOSIS — S51802A Unspecified open wound of left forearm, initial encounter: Secondary | ICD-10-CM | POA: Diagnosis not present

## 2017-01-19 DIAGNOSIS — M25561 Pain in right knee: Secondary | ICD-10-CM | POA: Diagnosis not present

## 2017-01-19 DIAGNOSIS — G8929 Other chronic pain: Secondary | ICD-10-CM | POA: Diagnosis not present

## 2017-02-13 ENCOUNTER — Encounter (HOSPITAL_COMMUNITY): Payer: Self-pay | Admitting: Emergency Medicine

## 2017-02-13 ENCOUNTER — Emergency Department (HOSPITAL_COMMUNITY): Payer: Medicare Other

## 2017-02-13 ENCOUNTER — Emergency Department (HOSPITAL_COMMUNITY)
Admission: EM | Admit: 2017-02-13 | Discharge: 2017-02-13 | Disposition: A | Discharge status: Home / Self Care | Payer: Medicare Other | Attending: Emergency Medicine | Admitting: Emergency Medicine

## 2017-02-13 DIAGNOSIS — S022XXA Fracture of nasal bones, initial encounter for closed fracture: Secondary | ICD-10-CM | POA: Insufficient documentation

## 2017-02-13 DIAGNOSIS — Y939 Activity, unspecified: Secondary | ICD-10-CM | POA: Insufficient documentation

## 2017-02-13 DIAGNOSIS — E039 Hypothyroidism, unspecified: Secondary | ICD-10-CM | POA: Insufficient documentation

## 2017-02-13 DIAGNOSIS — S0993XA Unspecified injury of face, initial encounter: Secondary | ICD-10-CM | POA: Diagnosis not present

## 2017-02-13 DIAGNOSIS — W010XXA Fall on same level from slipping, tripping and stumbling without subsequent striking against object, initial encounter: Secondary | ICD-10-CM | POA: Insufficient documentation

## 2017-02-13 DIAGNOSIS — Z79899 Other long term (current) drug therapy: Secondary | ICD-10-CM | POA: Diagnosis not present

## 2017-02-13 DIAGNOSIS — Y929 Unspecified place or not applicable: Secondary | ICD-10-CM | POA: Insufficient documentation

## 2017-02-13 DIAGNOSIS — Y999 Unspecified external cause status: Secondary | ICD-10-CM | POA: Diagnosis not present

## 2017-02-13 MED ORDER — HYDROCODONE-ACETAMINOPHEN 5-325 MG PO TABS: 1.0000 | ORAL_TABLET | ORAL | 0 refills | Status: DC | PRN

## 2017-02-13 MED ORDER — ACETAMINOPHEN 500 MG PO TABS
1000.0000 mg | ORAL_TABLET | Freq: Once | ORAL | Status: AC
  Administered 2017-02-13: 1000 mg via ORAL
  Filled 2017-02-13: qty 2

## 2017-02-13 NOTE — ED Triage Notes (Signed)
Pt was putting her trash out when she fell forward and injured her nose and mouth.  Pt has broken front tooth with laceration noted to bottom lip.  Swelling noted to nose with dried blood inside nares.  Ambulatory. A&O x4.  Denies LOC or blood thinner use.

## 2017-02-13 NOTE — ED Provider Notes (Signed)
[facility] DEPT Provider Note   CSN: [id number] Arrival date & time: 02/13/17  1946     History   Chief Complaint No chief complaint on file.   HPI Susan Bell is a 76 y.o. female.  HPI Patient is a 76 year old female presents the emergency department after slipping and falling today when moving her trash can.  She fell forward and injured her face and presents with swelling of her nose with associated abrasions.  She denies neck pain.  No weakness or paresthesias of her upper or lower extremities.  Denies headache.  No head injury.  All of the traumas to her face.  She did chip her front tooth as well.  She denies difficulty breathing or swallowing.  No neck pain.  No vomiting.  No use of anticoagulants.   Past Medical History:  Diagnosis Date  . Allergy    seasonal  . Anemia   . Arthritis   . Cataract    bilateral removed  . Celiac disease   . Eczema   . H/O transfusion of platelets   . Hyperlipidemia   . Hypothyroidism   . Osteopenia   . Shingles   . Vitamin D deficiency     Patient Active Problem List   Diagnosis Date Noted  . ABDOMINAL PAIN-RLQ 03/23/2010  . Celiac disease 06/29/2009  . CONSTIPATION 05/18/2009  . FLATULENCE-GAS-BLOATING 05/18/2009    Past Surgical History:  Procedure Laterality Date  . CATARACT EXTRACTION     bilateral  . COLONOSCOPY    . DILATION AND CURETTAGE OF UTERUS    . UPPER GI ENDOSCOPY      OB History    No data available       Home Medications    Prior to Admission medications   Medication Sig Start Date End Date Taking? Authorizing Provider  alendronate (FOSAMAX) 70 MG tablet  11/24/14  Yes [provider]  Ascorbic Acid (VITAMIN C ADULT GUMMIES PO) Take by mouth daily. Pt takes 2 gummies every day   Yes [provider]  Calcium Carbonate-Vitamin D (CALCIUM 600 + D PO) Take 1 tablet by mouth daily.     Yes [provider]  cholecalciferol (VITAMIN  D-400) 400 UNITS TABS Take by mouth.     Yes [provider]  Escitalopram Oxalate (LEXAPRO PO) Take 15 mg by mouth daily.   Yes [provider]  levothyroxine (LEVOTHROID) 88 MCG tablet Take 88 mcg by mouth daily.     Yes [provider]  losartan (COZAAR) 25 MG tablet Take 25 mg daily by mouth. 12/19/16  Yes [provider]  Multiple Vitamins-Minerals (MULTIVITAMIN WITH MINERALS) tablet Take 1 tablet daily by mouth.   Yes [provider]  multivitamin-lutein (OCUVITE-LUTEIN) CAPS capsule Take 1 capsule daily by mouth.   Yes [provider]  olopatadine (PATANOL) 0.1 % ophthalmic solution Place 1 drop into both eyes 2 (two) times daily.   Yes [provider]  Omega-3 Fatty Acids (FISH OIL) 1000 MG CAPS Take 2 capsules by mouth daily.     Yes [provider]  oxybutynin (DITROPAN) 5 MG tablet Take 2.5 mg every evening by mouth. 02/12/17  Yes [provider]  traMADol (ULTRAM) 50 MG tablet Take 50 mg every 6 (six) hours as needed by mouth for pain. 11/23/16  Yes [provider]  diclofenac (VOLTAREN) 75 MG EC tablet Take 1 tablet (75 mg total) by mouth 2 (two) times daily. Patient not taking: Reported  on 02/13/2017 06/29/15   Wallene Huh, DPM  HYDROcodone-acetaminophen (NORCO/VICODIN) 5-325 MG tablet Take 1 tablet every 4 (four) hours as needed by mouth for moderate pain. 02/13/17   Jola Schmidt, MD    Family History Family History  Problem Relation Age of Onset  . Liver cancer Mother   . Diabetes Father   . Heart disease Father   . Colon cancer Neg Hx     Social History Social History   Tobacco Use  . Smoking status: Never Smoker  . Smokeless tobacco: Never Used  Substance Use Topics  . Alcohol use: No    Alcohol/week: 0.0 oz  . Drug use: No     Allergies   Betadine [povidone iodine]   Review of Systems Review of Systems  All other systems reviewed and are negative.    Physical  Exam Updated Vital Signs BP (!) 181/63   Pulse 62   Temp 98.2 F (36.8 C) (Oral)   Resp 20   Ht 5' 5.5" (1.664 m)   Wt 72.6 kg (160 lb)   SpO2 99%   BMI 26.22 kg/m   Physical Exam  Constitutional: She is oriented to person, place, and time. She appears well-developed and well-nourished.  HENT:  Abrasions and swelling of the bridge of her nose.  Bilateral periorbital ecchymosis of the medial aspect.  Small abrasion to the left upper lip as well as the midline lower lip.  No lacerations.  No active bleeding at this time.  No epistaxis.  Ellis 2 fracture of tooth #8.  Midface is stable.  No trismus or malocclusion.  Eyes: EOM are normal.  Neck: Normal range of motion.  Pulmonary/Chest: Effort normal.  Abdominal: She exhibits no distension.  Musculoskeletal: Normal range of motion.  Full range of motion of bilateral shoulders, elbows and wrists. Full range of motion of bilateral hips, knees and ankles.    Neurological: She is alert and oriented to person, place, and time.  Psychiatric: She has a normal mood and affect.  Nursing note and vitals reviewed.    ED Treatments / Results  Labs (all labs ordered are listed, but only abnormal results are displayed) Labs Reviewed - No data to display  EKG  EKG Interpretation None       Radiology Ct Maxillofacial Wo Contrast  Result Date: 02/13/2017 CLINICAL DATA:  Fall, hit face on concrete, broken front tooth with laceration to the bottom lip EXAM: CT MAXILLOFACIAL WITHOUT CONTRAST TECHNIQUE: Multidetector CT imaging of the maxillofacial structures was performed. Multiplanar CT image reconstructions were also generated. COMPARISON:  04/15/2010 head CT FINDINGS: Osseous: Mandibular heads are normally positioned. No mandibular fracture. Pterygoid plates and zygomatic arches are intact. Acute, comminuted and displaced bilateral nasal bone fracture. 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 Chip fracture through the right maxillary central incisor on coronal views. Orbits: Negative. No traumatic or inflammatory finding. Sinuses: No fluid level. Mild mucosal thickening in the ethmoid sinuses. Soft tissues: Large amount of soft tissue swelling over the nasal area with small soft tissue gas present. Limited intracranial: No significant or unexpected finding. IMPRESSION: 1. Acute, comminuted and displaced bilateral nasal bone fracture. Reverse S shaped deformity of the nasal septum with fracture through the anterior bony nasal septum. 2. Chip fracture through right maxillary central incisor. 3. Moderate to large amount of soft tissue swelling over the upper lip and nasal area with multiple foci of soft tissue gas around the  nose. Electronically Signed   By: Donavan Foil M.D.   On: 02/13/2017 21:25    Procedures Procedures (including critical care time)  Medications Ordered in ED Medications  acetaminophen (TYLENOL) tablet 1,000 mg (1,000 mg Oral Given 02/13/17 2126)     Initial Impression / Assessment and Plan / ED Course  I have reviewed the triage vital signs and the nursing notes.  Pertinent labs & imaging results that were available during my care of the patient were reviewed by me and considered in my medical decision making (see chart for details).     Depressed nasal fracture.  Outpatient ENT follow-up.  Recommended anti-inflammatories and pain medicine.  Ice.  Dental follow-up for her Lissa Merlin to chip fracture.  No obvious exposed nerve at this time  Final Clinical Impressions(s) / ED Diagnoses   Final diagnoses:  Closed fracture of nasal bone, initial encounter    ED Discharge Orders        Ordered    HYDROcodone-acetaminophen (NORCO/VICODIN) 5-325 MG tablet  Every 4 hours PRN     02/13/17 2151       Jola Schmidt, MD 02/13/17 2242

## 2017-02-20 DIAGNOSIS — I10 Essential (primary) hypertension: Secondary | ICD-10-CM | POA: Diagnosis not present

## 2017-02-20 DIAGNOSIS — R05 Cough: Secondary | ICD-10-CM | POA: Diagnosis not present

## 2017-02-20 DIAGNOSIS — K219 Gastro-esophageal reflux disease without esophagitis: Secondary | ICD-10-CM | POA: Diagnosis not present

## 2017-02-20 DIAGNOSIS — S80212A Abrasion, left knee, initial encounter: Secondary | ICD-10-CM | POA: Diagnosis not present

## 2017-02-20 DIAGNOSIS — S022XXA Fracture of nasal bones, initial encounter for closed fracture: Secondary | ICD-10-CM | POA: Diagnosis not present

## 2017-02-20 DIAGNOSIS — S022XXD Fracture of nasal bones, subsequent encounter for fracture with routine healing: Secondary | ICD-10-CM | POA: Diagnosis not present

## 2017-02-20 DIAGNOSIS — S0083XA Contusion of other part of head, initial encounter: Secondary | ICD-10-CM | POA: Diagnosis not present

## 2017-02-20 DIAGNOSIS — Z6827 Body mass index (BMI) 27.0-27.9, adult: Secondary | ICD-10-CM | POA: Diagnosis not present

## 2017-04-03 ENCOUNTER — Encounter: Payer: Self-pay | Admitting: Podiatry

## 2017-04-03 ENCOUNTER — Ambulatory Visit (INDEPENDENT_AMBULATORY_CARE_PROVIDER_SITE_OTHER): Payer: Medicare Other | Admitting: Podiatry

## 2017-04-03 DIAGNOSIS — L600 Ingrowing nail: Secondary | ICD-10-CM

## 2017-04-03 DIAGNOSIS — M25562 Pain in left knee: Secondary | ICD-10-CM | POA: Diagnosis not present

## 2017-04-03 DIAGNOSIS — G8929 Other chronic pain: Secondary | ICD-10-CM | POA: Diagnosis not present

## 2017-04-03 DIAGNOSIS — M25561 Pain in right knee: Secondary | ICD-10-CM | POA: Diagnosis not present

## 2017-04-03 DIAGNOSIS — M25512 Pain in left shoulder: Secondary | ICD-10-CM | POA: Diagnosis not present

## 2017-04-03 NOTE — Patient Instructions (Signed)
Place 1/4 cup of epsom salts in a quart of warm tap water.  Submerge your foot or feet in the solution and soak for 20 minutes.  This soak should be done twice a day.  Next, remove your foot or feet from solution, blot dry the affected area. Apply ointment and cover if instructed by your doctor.   IF YOUR SKIN BECOMES IRRITATED WHILE USING THESE INSTRUCTIONS, IT IS OKAY TO SWITCH TO  WHITE VINEGAR AND WATER.  As another alternative soak, you may use antibacterial soap and water.  Monitor for any signs/symptoms of infection. Call the office immediately if any occur or go directly to the emergency room. Call with any questions/concerns.  Ingrown Toenail An ingrown toenail occurs when the corner or sides of your toenail grow into the surrounding skin. The big toe is most commonly affected, but it can happen to any of your toes. If your ingrown toenail is not treated, you will be at risk for infection. What are the causes? This condition may be caused by:  Wearing shoes that are too small or tight.  Injury or trauma, such as stubbing your toe or having your toe stepped on.  Improper cutting or care of your toenails.  Being born with (congenital) nail or foot abnormalities, such as having a nail that is too big for your toe.  What increases the risk? Risk factors for an ingrown toenail include:  Age. Your nails tend to thicken as you get older, so ingrown nails are more common in older people.  Diabetes.  Cutting your toenails incorrectly.  Blood circulation problems.  What are the signs or symptoms? Symptoms may include:  Pain, soreness, or tenderness.  Redness.  Swelling.  Hardening of the skin surrounding the toe.  Your ingrown toenail may be infected if there is fluid, pus, or drainage. How is this diagnosed? An ingrown toenail may be diagnosed by medical history and physical exam. If your toenail is infected, your health care provider may test a sample of the  drainage. How is this treated? Treatment depends on the severity of your ingrown toenail. Some ingrown toenails may be treated at home. More severe or infected ingrown toenails may require surgery to remove all or part of the nail. Infected ingrown toenails may also be treated with antibiotic medicines. Follow these instructions at home:  If you were prescribed an antibiotic medicine, finish all of it even if you start to feel better.  Soak your foot in warm soapy water for 20 minutes, 3 times per day or as directed by your health care provider.  Carefully lift the edge of the nail away from the sore skin by wedging a small piece of cotton under the corner of the nail. This may help with the pain. Be careful not to cause more injury to the area.  Wear shoes that fit well. If your ingrown toenail is causing you pain, try wearing sandals, if possible.  Trim your toenails regularly and carefully. Do not cut them in a curved shape. Cut your toenails straight across. This prevents injury to the skin at the corners of the toenail.  Keep your feet clean and dry.  If you are having trouble walking and are given crutches by your health care provider, use them as directed.  Do not pick at your toenail or try to remove it yourself.  Take medicines only as directed by your health care provider.  Keep all follow-up visits as directed by your health care provider. This   is important. Contact a health care provider if:  Your symptoms do not improve with treatment. Get help right away if:  You have red streaks that start at your foot and go up your leg.  You have a fever.  You have increased redness, swelling, or pain.  You have fluid, blood, or pus coming from your toenail. This information is not intended to replace advice given to you by your health care provider. Make sure you discuss any questions you have with your health care provider. Document Released: 03/18/2000 Document Revised:  08/21/2015 Document Reviewed: 02/12/2014 Elsevier Interactive Patient Education  2018 [employer]  

## 2017-04-03 NOTE — Progress Notes (Signed)
Subjective:   Patient ID: Susan Bell, female   DOB: 76 y.o.   MRN: [id number]   HPI Patient presents with very painful second nail left that is very thick and incurvated and hard to wear shoe gear with   ROS      Objective:  Physical Exam  Neurovascular status intact with patient noted to have damaged thickened second nail left that is loose and painful when palpated     Assessment:  Damage second nail left with pain     Plan:  H&P condition reviewed and recommended nail removal explained the procedure.  Patient wants surgery and I explained procedure and risk and she signed consent form and today I infiltrated the left hallux 60 mg Xylocaine Marcaine mixture removed the second nail exposed matrix and applied phenol for applications 30 seconds followed by alcohol lavage and sterile dressing.  Gave instructions on soaks and reappoint

## 2017-04-05 ENCOUNTER — Ambulatory Visit (INDEPENDENT_AMBULATORY_CARE_PROVIDER_SITE_OTHER): Payer: Medicare Other | Admitting: Podiatry

## 2017-04-05 ENCOUNTER — Encounter: Payer: Self-pay | Admitting: Podiatry

## 2017-04-05 DIAGNOSIS — B351 Tinea unguium: Secondary | ICD-10-CM

## 2017-04-05 NOTE — Patient Instructions (Signed)

## 2017-04-05 NOTE — Progress Notes (Signed)
Subjective:   Patient ID: Susan Bell, female   DOB: 77 y.o.   MRN: [id number]   HPI Patient presents concerned because is been a small blister on the left second toe and it is still bleeding   ROS      Objective:  Physical Exam  Neurovascular status intact with fresh blood on the second nail bed left after having an ingrown toenail procedure 2 days ago     Assessment:  Normal bleeding 2 days after procedure     Plan:  Reviewed condition and recommended continued soaks compression and it should heal uneventfully but gave instructions to be seen back if any issues were to occur

## 2017-04-06 ENCOUNTER — Ambulatory Visit: Payer: Medicare Other | Admitting: Podiatry

## 2017-04-12 ENCOUNTER — Ambulatory Visit (INDEPENDENT_AMBULATORY_CARE_PROVIDER_SITE_OTHER): Payer: Medicare Other | Admitting: Podiatry

## 2017-04-12 ENCOUNTER — Ambulatory Visit (INDEPENDENT_AMBULATORY_CARE_PROVIDER_SITE_OTHER): Payer: Medicare Other

## 2017-04-12 ENCOUNTER — Encounter: Payer: Self-pay | Admitting: Podiatry

## 2017-04-12 ENCOUNTER — Other Ambulatory Visit: Payer: Self-pay | Admitting: Podiatry

## 2017-04-12 DIAGNOSIS — M79671 Pain in right foot: Secondary | ICD-10-CM

## 2017-04-12 DIAGNOSIS — M779 Enthesopathy, unspecified: Secondary | ICD-10-CM | POA: Diagnosis not present

## 2017-04-12 DIAGNOSIS — L600 Ingrowing nail: Secondary | ICD-10-CM

## 2017-04-12 NOTE — Progress Notes (Signed)
Subjective:   Patient ID: Susan Bell, female   DOB: 77 y.o.   MRN: [id number]   HPI Patient presents stating that the dorsal of the right foot has started to really hurt and the second toe left is doing okay but still has some discomfort   ROS      Objective:  Physical Exam  Neurovascular status was intact with patient found to have discomfort mostly in the fourth metatarsal distal shaft right slightly in the third with patient noted to have well-healed nail site left second digit with mild redness of the toe that is localized and no proximal edema erythema or drainage noted     Assessment:  X-rays taken reviewed and there is a probability of stress fracture right and also toe that is under some vascular stress but appears to be getting gradually better left     Plan:  H&P conditions reviewed and continue warm water soaks for the left and protecting it against cold weather.  For the right I did dispense surgical shoe and advised that if symptoms were to be still significant in the next 3-4 weeks I want to see patient back  X-rays right were negative for signs of fracture or any other bony condition currently

## 2017-04-18 ENCOUNTER — Ambulatory Visit (INDEPENDENT_AMBULATORY_CARE_PROVIDER_SITE_OTHER): Payer: Medicare Other | Admitting: Podiatry

## 2017-04-18 ENCOUNTER — Encounter: Payer: Self-pay | Admitting: Podiatry

## 2017-04-18 ENCOUNTER — Ambulatory Visit (INDEPENDENT_AMBULATORY_CARE_PROVIDER_SITE_OTHER): Payer: Medicare Other

## 2017-04-18 DIAGNOSIS — L02612 Cutaneous abscess of left foot: Secondary | ICD-10-CM | POA: Diagnosis not present

## 2017-04-18 DIAGNOSIS — L03032 Cellulitis of left toe: Secondary | ICD-10-CM | POA: Diagnosis not present

## 2017-04-18 MED ORDER — NITROGLYCERIN 0.2 MG/HR TD PT24: 0.2000 mg | MEDICATED_PATCH | Freq: Every day | TRANSDERMAL | 12 refills | Status: DC

## 2017-04-18 MED ORDER — AMOXICILLIN-POT CLAVULANATE 875-125 MG PO TABS: 1.0000 | ORAL_TABLET | Freq: Two times a day (BID) | ORAL | 0 refills | Status: DC

## 2017-04-18 MED ORDER — MUPIROCIN 2 % EX OINT: 1.0000 "application " | TOPICAL_OINTMENT | Freq: Two times a day (BID) | CUTANEOUS | 2 refills | Status: DC

## 2017-04-18 NOTE — Progress Notes (Signed)
Subjective: 77 year old female presents the office today for concerns of a wound to her left second toe and she states the second toe "is not looking good".  She previously underwent nail avulsion with Dr. Paulla Dolly on 04/03/2017. 2 days later she was having bleeding she came to the office.  And this was apparently normal postop bleeding.  She states that since she last saw him the toe has been getting worse.  She states that the wound is formed on the top of the toe and she does not want to lose the toe. She has been soaking in Epsom salts and she is also been doing Betadine soaks.  She is currently not on any antibiotics. Denies any systemic complaints such as fevers, chills, nausea, vomiting. No acute changes since last appointment, and no other complaints at this time.   Objective: AAO x3, NAD DP/PT pulses palpable bilaterally, CRT less than 3 seconds except mildly delayed to the left second toe Status post left second digit toenail removal there is a scab overlying the nail bed.  There is a blister present along the medial aspect of the digit.  I was able to puncture this and clear blister fluid did drain but no purulence.  There is a wound on the dorsal aspect of the toe which appears to be turning into an eschar.  There is erythema to the digit but there is no ascending cellulitis.  There is no fluctuation or crepitation.  There is no malodor. No open lesions or pre-ulcerative lesions.  No pain with calf compression, swelling, warmth, erythema      Assessment: Left second digit toe cellulitis, ulceration status post nail avulsion  Plan: -All treatment options discussed with the patient including all alternatives, risks, complications.  -X-rays were obtained and reviewed.  No evidence of acute fracture and there is no evidence of osteoysis or osteomyelitis. -She has palpable pulses however it does have somewhat vascular compromise in appearance.  I ordered a nitroglycerin 0.2 mg patch that she  can put just proximal to the toe.  This with her not to put this anywhere else on her body other than just proximal to the digit and showed her where to put it. -I did drain the blister and a wound culture was obtained of the area. -Prescribed Augmentin -Mupirocin ointment daily -Open toed shoe -Monitor for any clinical signs or symptoms of infection and directed to call the office immediately should any occur or go to the ER. -Follow-up on Friday or sooner if needed. -Patient encouraged to call the office with any questions, concerns, change in symptoms.   *After she left I would like to get blood work.  We called her to inform her of this.  Trula Slade DPM

## 2017-04-19 DIAGNOSIS — L03032 Cellulitis of left toe: Secondary | ICD-10-CM | POA: Diagnosis not present

## 2017-04-19 DIAGNOSIS — L02612 Cutaneous abscess of left foot: Secondary | ICD-10-CM | POA: Diagnosis not present

## 2017-04-20 LAB — CBC WITH DIFFERENTIAL/PLATELET
Basophils Absolute: 63 cells/uL (ref 0–200)
Basophils Relative: 0.6 %
Eosinophils Absolute: 137 cells/uL (ref 15–500)
Eosinophils Relative: 1.3 %
HCT: 32.9 % — ABNORMAL LOW (ref 35.0–45.0)
Hemoglobin: 11.1 g/dL — ABNORMAL LOW (ref 11.7–15.5)
Lymphs Abs: 2163 cells/uL (ref 850–3900)
MCH: 29.5 pg (ref 27.0–33.0)
MCHC: 33.7 g/dL (ref 32.0–36.0)
MCV: 87.5 fL (ref 80.0–100.0)
MPV: 10.4 fL (ref 7.5–12.5)
Monocytes Relative: 9.8 %
Neutro Abs: 7109 cells/uL (ref 1500–7800)
Neutrophils Relative %: 67.7 %
Platelets: 394 10*3/uL (ref 140–400)
RBC: 3.76 10*6/uL — ABNORMAL LOW (ref 3.80–5.10)
RDW: 13.2 % (ref 11.0–15.0)
Total Lymphocyte: 20.6 %
WBC mixed population: 1029 cells/uL — ABNORMAL HIGH (ref 200–950)
WBC: 10.5 10*3/uL (ref 3.8–10.8)

## 2017-04-20 LAB — SEDIMENTATION RATE: Sed Rate: 6 mm/h (ref 0–30)

## 2017-04-20 LAB — C-REACTIVE PROTEIN: CRP: 1.1 mg/L (ref <8.0)

## 2017-04-21 ENCOUNTER — Encounter: Payer: Self-pay | Admitting: Podiatry

## 2017-04-21 ENCOUNTER — Ambulatory Visit (INDEPENDENT_AMBULATORY_CARE_PROVIDER_SITE_OTHER): Payer: Medicare Other | Admitting: Podiatry

## 2017-04-21 DIAGNOSIS — L600 Ingrowing nail: Secondary | ICD-10-CM | POA: Diagnosis not present

## 2017-04-21 DIAGNOSIS — L03032 Cellulitis of left toe: Secondary | ICD-10-CM | POA: Diagnosis not present

## 2017-04-21 DIAGNOSIS — L02612 Cutaneous abscess of left foot: Secondary | ICD-10-CM | POA: Diagnosis not present

## 2017-04-21 LAB — WOUND CULTURE
MICRO NUMBER:: 90060353
RESULT:: NO GROWTH
SPECIMEN QUALITY:: ADEQUATE

## 2017-04-21 NOTE — Progress Notes (Signed)
Subjective: Susan Bell presents the office today for follow-up evaluation of a wound to her left second toe.  She states that she did notice that the toe is looking better.  She is been using nitroglycerin patch which is been helping and she is been using small amount of antibiotic ointment to the area daily.  She is also been on antibiotics without any problems.  She has been wearing an open toed shoe.  Her right foot is doing better with the surgical shoe but she still gets pain in the right foot if she takes the shoe off.  No increase in swelling or redness to her feet.  Denies any systemic complaints such as fevers, chills, nausea, vomiting. No acute changes since last appointment, and no other complaints at this time.   Objective: AAO x3, NAD DP/PT pulses palpable bilaterally, CRT less than 3 seconds Overall the left second toe does appear to be improved.  The wound that she was having the dorsal aspect of the toe appears to be improving and the skin is coming off and there is new healthy skin underneath the area.  Dried blood present along the nail bed.  Erythema is improved to the toe.  There is no ascending cellulitis.  There is no fluctuation or crepitation.  There is no malodor. No open lesions or pre-ulcerative lesions.  No pain with calf compression, swelling, warmth, erythema          Assessment: Healing wounds left second toe  Plan: -All treatment options discussed with the patient including all alternatives, risks, complications.  -Area is improving appears that the infection is improving as well.  We discussed the lab work as well as the culture results today.  We will continue with antibiotic ointment dressing changes daily.  Also continue nitroglycerin patches to help with circulation.  Continue open toe shoe and offloading at all times.  Finished course of antibiotics.  Monitor for signs or symptoms of worsening infection call the office immediately should any occur.  Otherwise  I will see her back in 1 week or sooner if needed.  She agrees this plan she has no further questions. -Continue surgical shoe on right foot.  We will get a new x-ray right foot next appointment. -Patient encouraged to call the office with any questions, concerns, change in symptoms.   Trula Slade DPM

## 2017-04-28 ENCOUNTER — Ambulatory Visit (INDEPENDENT_AMBULATORY_CARE_PROVIDER_SITE_OTHER): Payer: Medicare Other

## 2017-04-28 ENCOUNTER — Encounter: Payer: Self-pay | Admitting: Podiatry

## 2017-04-28 ENCOUNTER — Ambulatory Visit (INDEPENDENT_AMBULATORY_CARE_PROVIDER_SITE_OTHER): Payer: Medicare Other | Admitting: Podiatry

## 2017-04-28 DIAGNOSIS — M779 Enthesopathy, unspecified: Secondary | ICD-10-CM | POA: Diagnosis not present

## 2017-04-28 DIAGNOSIS — M84374D Stress fracture, right foot, subsequent encounter for fracture with routine healing: Secondary | ICD-10-CM | POA: Diagnosis not present

## 2017-04-28 DIAGNOSIS — L03032 Cellulitis of left toe: Secondary | ICD-10-CM | POA: Diagnosis not present

## 2017-04-28 DIAGNOSIS — L02612 Cutaneous abscess of left foot: Secondary | ICD-10-CM

## 2017-04-28 DIAGNOSIS — L97501 Non-pressure chronic ulcer of other part of unspecified foot limited to breakdown of skin: Secondary | ICD-10-CM | POA: Diagnosis not present

## 2017-05-01 ENCOUNTER — Telehealth: Payer: Self-pay | Admitting: *Deleted

## 2017-05-01 MED ORDER — AMOXICILLIN-POT CLAVULANATE 875-125 MG PO TABS: 1.0000 | ORAL_TABLET | Freq: Two times a day (BID) | ORAL | 0 refills | Status: DC

## 2017-05-01 NOTE — Progress Notes (Signed)
Subjective: Susan Bell presents the office today for follow-up evaluation of a wound to her left second toe after undergoing total nail avulsion.  She has been using nitroglycerin patch.  She is been using the mupirocin ointment to the area daily.  She has noticed a small amount of clear drainage in the distal portion of the toe but no pus.  Denies any swelling or red streaks.  She also states the right foot is doing well she is no longer having any pain to the right foot she denies any swelling.  No recent injury or trauma or any changes since last appointment. Denies any systemic complaints such as fevers, chills, nausea, vomiting. No acute changes since last appointment, and no other complaints at this time.   Objective: AAO x3, NAD DP/PT pulses palpable bilaterally, CRT less than 3 seconds At this time there is no tenderness palpation of the right foot there is no area pinpoint bony tenderness.  There is no overlying edema, erythema, increase in warmth.  Joint range of motion intact. On the left side of the second digit there is a ulceration towards the distal aspect of the toe dorsally.  There is some dried blood on the nail bed.  There is starting to be epidermal lysis along the area to the distal portion of the toe and I was able to debride some of the loose tissue today and there is new, pink skin underneath the epidermal lysis.  There is an immediate capillary refill time to the digit both dorsally and distally and the skin on the plantar aspect appears to be viable.  There is no areas of fluctuation or crepitation.  There is no malodor.  There is no ascending cellulitis. No open lesions or pre-ulcerative lesions.  No pain with calf compression, swelling, warmth, erythema      Assessment: Wound left second toe; resolved pain right foot  Plan: -All treatment options discussed with the patient including all alternatives, risks, complications.  -X-rays were obtained of the right foot.  No  evidence of acute fracture identified today.  There is also no pain on clinical exam today. Will return to regular shoe make this a gradual transition.  There is any increase in pain she is to return to surgical shoe. -Regard to the left second toe I was able to debride some of the loose skin today to reveal new healthy pink skin underneath.  There is no drainage or pus expressed today.  Overall the wound is healing although slowly.  Continue with antibiotic ointment dressing changes daily as well as nitroglycerin patch. Dr. Paulla Dolly also saw her today.  I will be out of town next week and she is to follow-up with Dr. Paulla Dolly and she agrees with this. -Monitor for any clinical signs or symptoms of infection and directed to call the office immediately should any occur or go to the ER. -RTC 1 week or sooner if needed.  -Patient encouraged to call the office with any questions, concerns, change in symptoms.  *x-ray left foot next appointment   Trula Slade DPM

## 2017-05-01 NOTE — Telephone Encounter (Signed)
Pt request refill of antibiotic. Dr. Josephina Shih refill the Augmentin as prescribed by Dr. Jacqualyn Posey 04/18/2017 and remind pt of 05/03/2017 appt.

## 2017-05-01 NOTE — Telephone Encounter (Signed)
Left message informing pt of Dr. Mellody Drown orders and reminded of 05/03/2017 4:30am appt.

## 2017-05-03 ENCOUNTER — Ambulatory Visit (INDEPENDENT_AMBULATORY_CARE_PROVIDER_SITE_OTHER): Payer: Medicare Other

## 2017-05-03 ENCOUNTER — Ambulatory Visit (INDEPENDENT_AMBULATORY_CARE_PROVIDER_SITE_OTHER): Payer: Medicare Other | Admitting: Podiatry

## 2017-05-03 ENCOUNTER — Encounter: Payer: Self-pay | Admitting: Podiatry

## 2017-05-03 DIAGNOSIS — L02612 Cutaneous abscess of left foot: Secondary | ICD-10-CM | POA: Diagnosis not present

## 2017-05-03 DIAGNOSIS — L03032 Cellulitis of left toe: Secondary | ICD-10-CM

## 2017-05-03 NOTE — Progress Notes (Signed)
Subjective:   Patient ID: Susan Bell, female   DOB: 77 y.o.   MRN: [id number]   HPI Patient presents stating that overall she seems to be improving but the Nitropatch is to be bothersome for her but overall her toe feels pretty good with slight irritation distal   ROS      Objective:  Physical Exam  Neurovascular status intact with patient continuing to take her antibiotic with the second digit left healing well with some distal dehiscence which is still healing at the current time.  The toe is pink and warm and there is no current indication of advanced pathology     Assessment:  Appears to be showing continued improvement left second digit secondary to probable vascular stress     Plan:  Continue antibiotic and will begin Silvadene usage on the distal tissue with instructions for soaks and continue nitroglycerin patch usage.  Reviewed x-ray and patient will be seen back in approximately 9 days or earlier if needed  X-ray indicates that the digit is showing no bony pathology or other soft tissue pathology from a radiographic perspective

## 2017-05-08 DIAGNOSIS — Z961 Presence of intraocular lens: Secondary | ICD-10-CM | POA: Diagnosis not present

## 2017-05-08 DIAGNOSIS — H10413 Chronic giant papillary conjunctivitis, bilateral: Secondary | ICD-10-CM | POA: Diagnosis not present

## 2017-05-08 DIAGNOSIS — H40013 Open angle with borderline findings, low risk, bilateral: Secondary | ICD-10-CM | POA: Diagnosis not present

## 2017-05-08 DIAGNOSIS — H11011 Amyloid pterygium of right eye: Secondary | ICD-10-CM | POA: Diagnosis not present

## 2017-05-12 ENCOUNTER — Ambulatory Visit (INDEPENDENT_AMBULATORY_CARE_PROVIDER_SITE_OTHER): Payer: Medicare Other | Admitting: Podiatry

## 2017-05-12 DIAGNOSIS — L97501 Non-pressure chronic ulcer of other part of unspecified foot limited to breakdown of skin: Secondary | ICD-10-CM

## 2017-05-17 NOTE — Progress Notes (Signed)
Subjective: Susan Bell presents the office today for follow-up evaluation of a wound to her left second toe after undergoing total nail avulsion. She states that she is doing "way better".  She has a nitroglycerin patch swelling her toe was causing discomfort so she cut it in half.  She is been using antibiotic ointment on the toe daily.  She has not noticed any drainage or pus and denies any red streaks.  Overall she is pleased and is looking much better but states it is not completely well yet.  She will remain on Augmentin. Denies any systemic complaints such as fevers, chills, nausea, vomiting. No acute changes since last appointment, and no other complaints at this time.   Objective: AAO x3, NAD DP/PT pulses palpable bilaterally, CRT less than 3 seconds At this time there is no tenderness palpation of the right foot there is no area pinpoint bony tenderness.  There is no overlying edema, erythema, increase in warmth.  Joint range of motion intact.  She is doing well in the right side she has no pain or concerns On the left side of the second digit there is a ulceration towards the distal aspect of the toe dorsally.  As sharp, dried blood present over the nail bed.  There is epidermal lysis present the dorsal aspect of the toe.  Upon debridement small superficial granular wound is present but there is no purulence.  Small amount of clear drainage but after debridement of the area there is no further drainage.  There is no ascending cellulitis.  No fluctuation or crepitation.  There is no malodor.  Overall the erythema is much improved to the toe.  Still remains the distal portion along the dorsal aspect.  No open lesions or pre-ulcerative lesions.  No pain with calf compression, swelling, warmth, erythema        Assessment: Wound left second toe; resolved pain right foot  Plan: -All treatment options discussed with the patient including all alternatives, risks, complications.  -I was able to  debride some loose epidermal lysis today.  Continue antibiotic ointment dressing changes daily.  Continue nitroglycerin patch.  Since course of Augmentin.  Overall is doing better but still not completely healed yet we will continue to monitor closely. -Monitor for any clinical signs or symptoms of infection and directed to call the office immediately should any occur or go to the ER. -Follow-up in 1 week or sooner if needed.  She has no further questions or concerns today.  Trula Slade DPM

## 2017-05-23 ENCOUNTER — Ambulatory Visit (INDEPENDENT_AMBULATORY_CARE_PROVIDER_SITE_OTHER): Payer: Medicare Other | Admitting: Podiatry

## 2017-05-23 ENCOUNTER — Encounter: Payer: Self-pay | Admitting: Podiatry

## 2017-05-23 DIAGNOSIS — L97501 Non-pressure chronic ulcer of other part of unspecified foot limited to breakdown of skin: Secondary | ICD-10-CM | POA: Diagnosis not present

## 2017-05-23 NOTE — Patient Instructions (Signed)
Continue silvadene dressing changes daily. Can leave uncovered at the house Monitor for any signs/symptoms of infection. Call the office immediately if any occur or go directly to the emergency room. Call with any questions/concerns.Marland Kitchen

## 2017-05-24 DIAGNOSIS — I10 Essential (primary) hypertension: Secondary | ICD-10-CM | POA: Diagnosis not present

## 2017-05-24 DIAGNOSIS — E7849 Other hyperlipidemia: Secondary | ICD-10-CM | POA: Diagnosis not present

## 2017-05-24 DIAGNOSIS — E559 Vitamin D deficiency, unspecified: Secondary | ICD-10-CM | POA: Diagnosis not present

## 2017-05-24 DIAGNOSIS — E038 Other specified hypothyroidism: Secondary | ICD-10-CM | POA: Diagnosis not present

## 2017-05-24 DIAGNOSIS — R82998 Other abnormal findings in urine: Secondary | ICD-10-CM | POA: Diagnosis not present

## 2017-05-24 NOTE — Progress Notes (Signed)
Subjective: Susan Bell presents the office today for follow-up evaluation of a wound to her left second toe that she sustained after having a total nail avulsion with chemical matricectomy.  She states that she feels the toe is looking much better.  She has no longer been using nitro patches.  She is been keeping the antibiotic ointment on the area daily.  She has finished a course of antibiotics and she is no longer on any oral antibiotics.  She denies any swelling or any drainage or pus.  She is happy today that the toe is healing.  She has no new concerns.  She states that her right foot continues to do well not having any pain or swelling.  Denies any systemic complaints such as fevers, chills, nausea, vomiting. No acute changes since last appointment, and no other complaints at this time.   Objective: AAO x3, NAD DP/PT pulses palpable bilaterally, CRT less than 3 seconds On the left second toe the toe is much improved.  There is still some trace edema to the distal aspect of the toe as well as mild erythema but there is no increase in warmth or ascending cellulitis.  There is no areas of fluctuation or crepitation.  There is no malodor.  There is a scab overlying the nail bed and some loose hyperkeratotic tissue on the area.  I debrided the loose hyperkeratotic tissue on the periwound of the scab.  There is no underlying ulceration identified and there is no drainage or pus. No open lesions or pre-ulcerative lesions.  No pain with calf compression, swelling, warmth, erythema      Assessment: Healing wound left second toe  Plan: -All treatment options discussed with the patient including all alternatives, risks, complications.  -I debrided the loose hyperkeratotic tissue to the distal aspect of the toe just distal to the scab.  Area appears to be healing.  Continue with silvadene daily.  Offloading at all times.  She did try to go back into a shoe last week and she did wear this for some time but  once the toe started her shoe and back into an open toed shoe.  We will hold off any further oral antibiotics at this time. -Monitor for any clinical signs or symptoms of infection and directed to call the office immediately should any occur or go to the ER. -Follow-up in 10 days or sooner if needed.  Call any questions or concerns.  She agrees with this plan has no further questions or concerns today. -Patient encouraged to call the office with any questions, concerns, change in symptoms.   Trula Slade DPM

## 2017-06-01 ENCOUNTER — Other Ambulatory Visit: Payer: Self-pay | Admitting: Internal Medicine

## 2017-06-01 DIAGNOSIS — I10 Essential (primary) hypertension: Secondary | ICD-10-CM | POA: Diagnosis not present

## 2017-06-01 DIAGNOSIS — R05 Cough: Secondary | ICD-10-CM | POA: Diagnosis not present

## 2017-06-01 DIAGNOSIS — E038 Other specified hypothyroidism: Secondary | ICD-10-CM | POA: Diagnosis not present

## 2017-06-01 DIAGNOSIS — Z1389 Encounter for screening for other disorder: Secondary | ICD-10-CM | POA: Diagnosis not present

## 2017-06-01 DIAGNOSIS — D692 Other nonthrombocytopenic purpura: Secondary | ICD-10-CM | POA: Diagnosis not present

## 2017-06-01 DIAGNOSIS — E785 Hyperlipidemia, unspecified: Secondary | ICD-10-CM

## 2017-06-01 DIAGNOSIS — Z6828 Body mass index (BMI) 28.0-28.9, adult: Secondary | ICD-10-CM | POA: Diagnosis not present

## 2017-06-01 DIAGNOSIS — Z1212 Encounter for screening for malignant neoplasm of rectum: Secondary | ICD-10-CM | POA: Diagnosis not present

## 2017-06-01 DIAGNOSIS — K900 Celiac disease: Secondary | ICD-10-CM | POA: Diagnosis not present

## 2017-06-01 DIAGNOSIS — M79676 Pain in unspecified toe(s): Secondary | ICD-10-CM | POA: Diagnosis not present

## 2017-06-01 DIAGNOSIS — E7849 Other hyperlipidemia: Secondary | ICD-10-CM | POA: Diagnosis not present

## 2017-06-01 DIAGNOSIS — F325 Major depressive disorder, single episode, in full remission: Secondary | ICD-10-CM | POA: Diagnosis not present

## 2017-06-01 DIAGNOSIS — Z0000 Encounter for general adult medical examination without abnormal findings: Secondary | ICD-10-CM | POA: Diagnosis not present

## 2017-06-01 DIAGNOSIS — K219 Gastro-esophageal reflux disease without esophagitis: Secondary | ICD-10-CM | POA: Diagnosis not present

## 2017-06-02 ENCOUNTER — Ambulatory Visit (INDEPENDENT_AMBULATORY_CARE_PROVIDER_SITE_OTHER): Payer: Medicare Other | Admitting: Podiatry

## 2017-06-02 DIAGNOSIS — L97501 Non-pressure chronic ulcer of other part of unspecified foot limited to breakdown of skin: Secondary | ICD-10-CM | POA: Diagnosis not present

## 2017-06-07 NOTE — Progress Notes (Signed)
Subjective: Susan Bell presents the office today for follow-up evaluation of a wound to her left second toe that she sustained after having a total nail avulsion with chemical matricectomy.  She states that he was doing much better.  She states that when she put Silvadene on the area does burn but she states that she gets quite a bit of Silvadene on the area.  She denies any drainage or pus and she states that it is much improved although is taking a long time.  She denies any increase in swelling or redness and she denies any red streaks.  Overall she feels well and she denies any systemic complaints including fevers, chills, nausea, vomiting.  She denies any calf pain, chest pain, shortness of breath.  She has no other concerns today.  Denies any systemic complaints such as fevers, chills, nausea, vomiting. No acute changes since last appointment, and no other complaints at this time.   Objective: AAO x3, NAD DP/PT pulses palpable bilaterally, CRT less than 3 seconds On the left second toe the toe is much improved.  There is no significant ulceration or epidermal lysis.  There is still a scab along the area of the actual nail bed but otherwise the area appears to be much improved.  There is still some mild edema as well as faint erythema but there is no increase in warmth.  I think the residual erythema is due to chronic inflammation as opposed to infection.  There is no areas of fluctuation or crepitation.  No drainage or purulence.  No malodor. No other open lesions or pre-ulcerative lesions. No pain with calf compression, swelling, warmth, erythema   Assessment: Healing wound left second toe  Plan: -All treatment options discussed with the patient including all alternatives, risks, complications.  -At this time the toe is doing much better.  Only small scab still remains in the actual nail bed.  There is no clinical signs of infection so we will hold off on further antibiotics at this time.  She  does have some faint erythema and swelling however I do think this is more from inflammation and the delayed healing of the toe.  She can continue with regular shoes as tolerated.  Monitor for signs or symptoms of recurrence of infection or any worsening of the toe.  I will see her back next couple of weeks or sooner if any issues are to arise.  Call any questions or concerns.  Trula Slade DPM

## 2017-06-20 ENCOUNTER — Encounter: Payer: Self-pay | Admitting: Podiatry

## 2017-06-20 ENCOUNTER — Ambulatory Visit (INDEPENDENT_AMBULATORY_CARE_PROVIDER_SITE_OTHER): Payer: Medicare Other | Admitting: Podiatry

## 2017-06-20 DIAGNOSIS — L97501 Non-pressure chronic ulcer of other part of unspecified foot limited to breakdown of skin: Secondary | ICD-10-CM | POA: Diagnosis not present

## 2017-06-21 DIAGNOSIS — M13862 Other specified arthritis, left knee: Secondary | ICD-10-CM | POA: Diagnosis not present

## 2017-06-21 DIAGNOSIS — M170 Bilateral primary osteoarthritis of knee: Secondary | ICD-10-CM | POA: Insufficient documentation

## 2017-06-21 DIAGNOSIS — M13861 Other specified arthritis, right knee: Secondary | ICD-10-CM | POA: Diagnosis not present

## 2017-06-21 NOTE — Progress Notes (Signed)
Subjective: Harmonii presents the office today for follow-up evaluation of a wound to her left second toe that she sustained after having a total nail avulsion with chemical matricectomy.  She states that she is doing "great".  She has been putting a small amount of Silvadene on during the table leaving the area uncovered at night.  She has not noticed any drainage or pus.  She feels the redness to the toe is getting better.  She denies any pain. She has no new concerns today.  Denies any systemic complaints such as fevers, chills, nausea, vomiting. No acute changes since last appointment, and no other complaints at this time.   Objective: AAO x3, NAD DP/PT pulses palpable bilaterally, CRT less than 3 seconds There is no definitive ulceration identified to the toe today except along the actual nail bed which is a small amount of fibrotic tissue.  Upon debridement there is a very small granular wound present.  There is no probing, undermining or tunneling there is no exposed bone.  There is decrease edema and erythema to the toe with erythema appears to be coming more distal and improving.  There is no fluctuation or crepitation.  There is no malodor.  No increase in warmth to the toe.  No other open lesions or pre-ulcerative lesions.  No pain to the contralateral extremity. No other open lesions or pre-ulcerative lesions. No pain with calf compression, swelling, warmth, erythema   Assessment: Healing wound left second toe  Plan: -All treatment options discussed with the patient including all alternatives, risks, complications.  -I debrided the small fibrotic wound to the dorsal aspect of the left second toe today.  Continue small amount of antibiotic ointment and a bandage in the day but leave the area uncovered at night.  Discussed Epsom salts soaks. -No purulence and the erythema appears to be improving so we will hold off any further oral antibiotics.  Overall the toe is doing better. -Monitor for  any clinical signs or symptoms of infection and directed to call the office immediately should any occur or go to the ER. -RTC 2 weeks or sooner if needed.  Trula Slade DPM

## 2017-06-22 ENCOUNTER — Ambulatory Visit
Admission: RE | Admit: 2017-06-22 | Discharge: 2017-06-22 | Disposition: A | Discharge status: Home / Self Care | Payer: No Typology Code available for payment source | Source: Ambulatory Visit | Attending: Internal Medicine | Admitting: Internal Medicine

## 2017-06-22 DIAGNOSIS — E785 Hyperlipidemia, unspecified: Secondary | ICD-10-CM

## 2017-07-04 ENCOUNTER — Ambulatory Visit (INDEPENDENT_AMBULATORY_CARE_PROVIDER_SITE_OTHER): Payer: Medicare Other | Admitting: Podiatry

## 2017-07-04 ENCOUNTER — Encounter: Payer: Self-pay | Admitting: Podiatry

## 2017-07-04 DIAGNOSIS — L97501 Non-pressure chronic ulcer of other part of unspecified foot limited to breakdown of skin: Secondary | ICD-10-CM | POA: Diagnosis not present

## 2017-07-04 NOTE — Progress Notes (Signed)
Subjective: Susan Bell presents the office today for follow-up evaluation of a wound to her left second toe that she sustained after having a total nail avulsion with chemical matricectomy.  She states that she is doing well she thinks the area is healed.  The heart and Wants to Get Neosporin She Pulled a Piece of Skin off Today She Denies Any Drainage or Pus Coming from the Area and She Feels That the Redness Is Continue to Improve and She Denies Any Red Streaks.  She Is Able to Wear a Shoe without Any Problems.  She Has No New Concerns Today. Denies any systemic complaints such as fevers, chills, nausea, vomiting. No acute changes since last appointment, and no other complaints at this time.   Objective: AAO x3, NAD DP/PT pulses palpable bilaterally, CRT less than 3 seconds The nail bed appears to be healed today in the left second toe.  There is decreased erythema to the toe and continues to recede.  There is no ascending cellulitis, edema or increase in warmth.  There is no drainage or pus.  I was able to debride some of the loose hyperkeratotic tissue which remains in however upon debridement there is no underlying ulceration or any signs of infection present. No pain to the toe.  No other open lesions or pre-ulcerative lesions. No pain with calf compression, swelling, warmth, erythema   Assessment: Healed wound left second toe  Plan: -All treatment options discussed with the patient including all alternatives, risks, complications.  -I debrided some of the loose hyperkeratotic tissue to the second toenail bed today without any complications or bleeding.  There is no signs of infection today and the erythema continues to improve.  At this point there is no wound appears to be healed.  Discussed she keep a small amount of antibiotic ointment and a bandage on for precaution.  At this point discharge from the care as she has been doing well there is no signs of infection but I encouraged her to call  any questions or concerns or any change in symptoms and she agrees with this plan.  Susan Bell DPM

## 2017-08-29 DIAGNOSIS — R58 Hemorrhage, not elsewhere classified: Secondary | ICD-10-CM | POA: Diagnosis not present

## 2017-08-29 DIAGNOSIS — D6489 Other specified anemias: Secondary | ICD-10-CM | POA: Diagnosis not present

## 2017-08-29 DIAGNOSIS — D692 Other nonthrombocytopenic purpura: Secondary | ICD-10-CM | POA: Diagnosis not present

## 2017-08-29 DIAGNOSIS — Z6828 Body mass index (BMI) 28.0-28.9, adult: Secondary | ICD-10-CM | POA: Diagnosis not present

## 2017-08-30 DIAGNOSIS — M1712 Unilateral primary osteoarthritis, left knee: Secondary | ICD-10-CM | POA: Diagnosis not present

## 2017-10-11 ENCOUNTER — Ambulatory Visit (INDEPENDENT_AMBULATORY_CARE_PROVIDER_SITE_OTHER): Payer: Medicare Other | Admitting: Podiatry

## 2017-10-11 ENCOUNTER — Encounter: Payer: Self-pay | Admitting: Podiatry

## 2017-10-11 DIAGNOSIS — Z6828 Body mass index (BMI) 28.0-28.9, adult: Secondary | ICD-10-CM | POA: Diagnosis not present

## 2017-10-11 DIAGNOSIS — L819 Disorder of pigmentation, unspecified: Secondary | ICD-10-CM | POA: Diagnosis not present

## 2017-10-11 DIAGNOSIS — S40921A Unspecified superficial injury of right upper arm, initial encounter: Secondary | ICD-10-CM | POA: Diagnosis not present

## 2017-10-12 NOTE — Progress Notes (Signed)
Subjective: 77 year old female presents the office today for evaluation of her left second toe.  She states that she is doing much better than she was after she had some small discoloration which is been ongoing since I last saw her and she wants to have the area checked.  She denies any pain to the toe and she denies any open sores.  She states that the discoloration is intermittent. Denies any systemic complaints such as fevers, chills, nausea, vomiting. No acute changes since last appointment, and no other complaints at this time.   Objective: AAO x3, NAD DP/PT pulses palpable bilaterally, CRT less than 3 seconds On the dorsal more medial aspect of the toe there is some slight discoloration of the area where she had a blister.  Some dark discoloration to the area but it is blanchable and has good capillary refill time.  There is no increase in warmth.  There is no edema to the toe.  There is no fluctuation or crepitation or any clinical signs of infection. No open lesions or pre-ulcerative lesions.  No pain with calf compression, swelling, warmth, erythema  Assessment: Discoloration left second toe  Plan: -All treatment options discussed with the patient including all alternatives, risks, complications.  -Discussed with her to use some cocoa butter or vitamin E cream over the area to help with the color.  Discussed this may be an ongoing issue for her but if it worsens to let me know.  She has no further questions and she does want to have the area checked. -Patient encouraged to call the office with any questions, concerns, change in symptoms.   Trula Slade DPM

## 2017-10-16 DIAGNOSIS — Z1231 Encounter for screening mammogram for malignant neoplasm of breast: Secondary | ICD-10-CM | POA: Diagnosis not present

## 2017-10-17 DIAGNOSIS — Z85828 Personal history of other malignant neoplasm of skin: Secondary | ICD-10-CM | POA: Diagnosis not present

## 2017-10-17 DIAGNOSIS — D1801 Hemangioma of skin and subcutaneous tissue: Secondary | ICD-10-CM | POA: Diagnosis not present

## 2017-10-17 DIAGNOSIS — D692 Other nonthrombocytopenic purpura: Secondary | ICD-10-CM | POA: Diagnosis not present

## 2017-10-17 DIAGNOSIS — D225 Melanocytic nevi of trunk: Secondary | ICD-10-CM | POA: Diagnosis not present

## 2017-10-17 DIAGNOSIS — D235 Other benign neoplasm of skin of trunk: Secondary | ICD-10-CM | POA: Diagnosis not present

## 2017-10-17 DIAGNOSIS — L720 Epidermal cyst: Secondary | ICD-10-CM | POA: Diagnosis not present

## 2017-10-17 DIAGNOSIS — L821 Other seborrheic keratosis: Secondary | ICD-10-CM | POA: Diagnosis not present

## 2017-10-26 ENCOUNTER — Other Ambulatory Visit: Payer: Self-pay

## 2017-10-26 ENCOUNTER — Emergency Department (HOSPITAL_COMMUNITY)
Admission: EM | Admit: 2017-10-26 | Discharge: 2017-10-26 | Disposition: A | Discharge status: Home / Self Care | Payer: Medicare Other | Attending: Emergency Medicine | Admitting: Emergency Medicine

## 2017-10-26 ENCOUNTER — Encounter (HOSPITAL_COMMUNITY): Payer: Self-pay

## 2017-10-26 ENCOUNTER — Emergency Department (HOSPITAL_COMMUNITY): Payer: Medicare Other

## 2017-10-26 DIAGNOSIS — R0789 Other chest pain: Secondary | ICD-10-CM | POA: Diagnosis not present

## 2017-10-26 DIAGNOSIS — Z79899 Other long term (current) drug therapy: Secondary | ICD-10-CM | POA: Diagnosis not present

## 2017-10-26 DIAGNOSIS — Z6827 Body mass index (BMI) 27.0-27.9, adult: Secondary | ICD-10-CM | POA: Diagnosis not present

## 2017-10-26 DIAGNOSIS — R079 Chest pain, unspecified: Secondary | ICD-10-CM | POA: Diagnosis not present

## 2017-10-26 DIAGNOSIS — R000 Tachycardia, unspecified: Secondary | ICD-10-CM | POA: Diagnosis not present

## 2017-10-26 DIAGNOSIS — E039 Hypothyroidism, unspecified: Secondary | ICD-10-CM | POA: Diagnosis not present

## 2017-10-26 DIAGNOSIS — R5383 Other fatigue: Secondary | ICD-10-CM | POA: Diagnosis not present

## 2017-10-26 DIAGNOSIS — E038 Other specified hypothyroidism: Secondary | ICD-10-CM | POA: Diagnosis not present

## 2017-10-26 DIAGNOSIS — R531 Weakness: Secondary | ICD-10-CM | POA: Diagnosis not present

## 2017-10-26 DIAGNOSIS — R0902 Hypoxemia: Secondary | ICD-10-CM | POA: Diagnosis not present

## 2017-10-26 DIAGNOSIS — I480 Paroxysmal atrial fibrillation: Secondary | ICD-10-CM | POA: Diagnosis not present

## 2017-10-26 DIAGNOSIS — E7849 Other hyperlipidemia: Secondary | ICD-10-CM | POA: Diagnosis not present

## 2017-10-26 DIAGNOSIS — I10 Essential (primary) hypertension: Secondary | ICD-10-CM | POA: Diagnosis not present

## 2017-10-26 DIAGNOSIS — T148XXA Other injury of unspecified body region, initial encounter: Secondary | ICD-10-CM | POA: Diagnosis not present

## 2017-10-26 DIAGNOSIS — I4891 Unspecified atrial fibrillation: Secondary | ICD-10-CM

## 2017-10-26 LAB — CBC
HCT: 34.1 % — ABNORMAL LOW (ref 36.0–46.0)
Hemoglobin: 10.5 g/dL — ABNORMAL LOW (ref 12.0–15.0)
MCH: 26.6 pg (ref 26.0–34.0)
MCHC: 30.8 g/dL (ref 30.0–36.0)
MCV: 86.3 fL (ref 78.0–100.0)
Platelets: 374 10*3/uL (ref 150–400)
RBC: 3.95 MIL/uL (ref 3.87–5.11)
RDW: 17 % — ABNORMAL HIGH (ref 11.5–15.5)
WBC: 14.7 10*3/uL — ABNORMAL HIGH (ref 4.0–10.5)

## 2017-10-26 LAB — TSH: TSH: 0.468 u[IU]/mL (ref 0.350–4.500)

## 2017-10-26 LAB — BASIC METABOLIC PANEL
Anion gap: 8 (ref 5–15)
BUN: 10 mg/dL (ref 8–23)
CO2: 24 mmol/L (ref 22–32)
Calcium: 8.9 mg/dL (ref 8.9–10.3)
Chloride: 100 mmol/L (ref 98–111)
Creatinine, Ser: 0.8 mg/dL (ref 0.44–1.00)
GFR calc Af Amer: >60 mL/min (ref >60)
GFR calc non Af Amer: >60 mL/min (ref >60)
Glucose, Bld: 131 mg/dL — ABNORMAL HIGH (ref 70–99)
Potassium: 3.8 mmol/L (ref 3.5–5.1)
Sodium: 132 mmol/L — ABNORMAL LOW (ref 135–145)

## 2017-10-26 LAB — MAGNESIUM: Magnesium: 2 mg/dL (ref 1.7–2.4)

## 2017-10-26 LAB — I-STAT TROPONIN, ED: Troponin i, poc: 0 ng/mL (ref 0.00–0.08)

## 2017-10-26 MED ORDER — SODIUM CHLORIDE 0.9 % IV SOLN
250.0000 mL | INTRAVENOUS | Status: DC
  Administered 2017-10-26: 250 mL via INTRAVENOUS

## 2017-10-26 MED ORDER — APIXABAN 5 MG PO TABS: 5.0000 mg | ORAL_TABLET | Freq: Two times a day (BID) | ORAL | 0 refills | Status: DC

## 2017-10-26 MED ORDER — ETOMIDATE 2 MG/ML IV SOLN
0.1000 mg/kg | Freq: Once | INTRAVENOUS | Status: AC
  Administered 2017-10-26: 7.58 mg via INTRAVENOUS
  Filled 2017-10-26: qty 10

## 2017-10-26 MED ORDER — APIXABAN 5 MG PO TABS
5.0000 mg | ORAL_TABLET | Freq: Once | ORAL | Status: AC
  Administered 2017-10-26: 5 mg via ORAL
  Filled 2017-10-26: qty 1

## 2017-10-26 MED ORDER — FENTANYL CITRATE (PF) 100 MCG/2ML IJ SOLN
75.0000 ug | Freq: Once | INTRAMUSCULAR | Status: AC | PRN
  Administered 2017-10-26: 75 ug via INTRAVENOUS
  Filled 2017-10-26: qty 2

## 2017-10-26 NOTE — Sedation Documentation (Signed)
Pt shocked at 200J

## 2017-10-26 NOTE — Sedation Documentation (Signed)
Pt shocked at 150J.

## 2017-10-26 NOTE — Discharge Instructions (Addendum)
Follow up with the atrial fibrillation clinic as we discussed. I have highlighted the information to the office.   Take the eliquis twice a day. Remember that this medicine puts you at increased risk of bleed.   Return to the ER if you have any new or concerning symptoms like chest pain, trouble breathing, feel like you are going to pass out.

## 2017-10-26 NOTE — Sedation Documentation (Addendum)
Pt shocked at 125 J

## 2017-10-26 NOTE — ED Provider Notes (Signed)
[facility] EMERGENCY DEPARTMENT Provider Note   CSN: [id number] Arrival date & time: 10/26/17  1636     History   Chief Complaint Chief Complaint  Patient presents with  . Atrial Fibrillation    HPI Susan Bell is a 77 y.o. female.  HPI  Susan Bell is a 77yo female with a history of hyperlipidemia, hypothyroidism, celiac disease who presents to the emergency department via EMS from her PCP office for evaluation of new onset atrial fibrillation. Patient states that she woke up at 4am with a sensation of "chest weirdness." Has had a persistent dull discomfort in her central chest which radiates up towards her neck.  States she felt completely normal yesterday evening.  She took a baby aspirin this morning due to her symptoms, is not on regular anticoagulation. She reports associated fatigue.  Denies shortness of breath, diaphoresis, nausea/vomiting, lightheadedness or dizziness.  Denies alcohol use.  Has not had any change in her Synthroid medication recently.  She denies history of MI or CHF.  Denies history of PE/DVT, active cancer, recent surgery or immobilization, exogenous estrogen, leg swelling or calf tenderness.  She denies fever, chills, cough, palpitations, abdominal pain, urinary symptoms.  Past Medical History:  Diagnosis Date  . Allergy    seasonal  . Anemia   . Arthritis   . Cataract    bilateral removed  . Celiac disease   . Eczema   . H/O transfusion of platelets   . Hyperlipidemia   . Hypothyroidism   . Osteopenia   . Shingles   . Vitamin D deficiency     Patient Active Problem List   Diagnosis Date Noted  . ABDOMINAL PAIN-RLQ 03/23/2010  . Celiac disease 06/29/2009  . CONSTIPATION 05/18/2009  . FLATULENCE-GAS-BLOATING 05/18/2009    Past Surgical History:  Procedure Laterality Date  . CATARACT EXTRACTION     bilateral  . COLONOSCOPY    . DILATION AND CURETTAGE OF UTERUS    . UPPER GI ENDOSCOPY       OB History    None      Home Medications    Prior to Admission medications   Medication Sig Start Date End Date Taking? Authorizing Provider  alendronate (FOSAMAX) 70 MG tablet Take 70 mg by mouth once a week.  11/24/14   [provider]  amoxicillin-clavulanate (AUGMENTIN) 875-125 MG tablet Take 1 tablet by mouth 2 (two) times daily. 04/18/17   Trula Slade, DPM  amoxicillin-clavulanate (AUGMENTIN) 875-125 MG tablet Take 1 tablet by mouth 2 (two) times daily. 05/01/17   Wallene Huh, DPM  Ascorbic Acid (VITAMIN C ADULT GUMMIES PO) Take by mouth daily. Pt takes 2 gummies every day    [provider]  Calcium Carbonate-Vitamin D (CALCIUM 600 + D PO) Take 1 tablet by mouth daily.      [provider]  cholecalciferol (VITAMIN D-400) 400 UNITS TABS Take by mouth.      [provider]  diclofenac (VOLTAREN) 75 MG EC tablet Take 1 tablet (75 mg total) by mouth 2 (two) times daily. 06/29/15   Wallene Huh, DPM  Escitalopram Oxalate (LEXAPRO PO) Take 15 mg by mouth daily.    [provider]  HYDROcodone-acetaminophen (NORCO/VICODIN) 5-325 MG tablet Take 1 tablet every 4 (four) hours as needed by mouth for moderate pain. 02/13/17   Jola Schmidt, MD  levothyroxine (LEVOTHROID) 88 MCG tablet Take 88 mcg by mouth daily.      [provider]  losartan (COZAAR) 25 MG tablet Take 25 mg daily by mouth. 12/19/16   [provider]  Multiple Vitamins-Minerals (MULTIVITAMIN WITH MINERALS) tablet Take 1 tablet daily by mouth.    [provider]  multivitamin-lutein (OCUVITE-LUTEIN) CAPS capsule Take 1 capsule daily by mouth.    [provider]  mupirocin ointment (BACTROBAN) 2 % Apply 1 application topically 2 (two) times daily. 04/18/17   Trula Slade, DPM  nitroGLYCERIN (NITRO-DUR) 0.2 mg/hr patch Place 1 patch (0.2 mg total) onto the skin daily. 04/18/17   Trula Slade, DPM  olopatadine (PATANOL) 0.1 % ophthalmic solution  Place 1 drop into both eyes 2 (two) times daily.    [provider]  Omega-3 Fatty Acids (FISH OIL) 1000 MG CAPS Take 2 capsules by mouth daily.      [provider]  oxybutynin (DITROPAN) 5 MG tablet Take 2.5 mg every evening by mouth. 02/12/17   [provider]  traMADol (ULTRAM) 50 MG tablet Take 50 mg every 6 (six) hours as needed by mouth for pain. 11/23/16   [provider]    Family History Family History  Problem Relation Age of Onset  . Liver cancer Mother   . Diabetes Father   . Heart disease Father   . Colon cancer Neg Hx     Social History Social History   Tobacco Use  . Smoking status: Never Smoker  . Smokeless tobacco: Never Used  Substance Use Topics  . Alcohol use: No    Alcohol/week: 0.0 oz  . Drug use: No     Allergies   Betadine [povidone iodine]   Review of Systems Review of Systems  Constitutional: Positive for fatigue. Negative for chills, diaphoresis and fever.  Eyes: Negative for visual disturbance.  Respiratory: Negative for cough and shortness of breath.   Cardiovascular: Positive for chest pain. Negative for palpitations and leg swelling.  Gastrointestinal: Negative for abdominal pain, nausea and vomiting.  Genitourinary: Negative for difficulty urinating.  Musculoskeletal: Negative for back pain.  Skin: Negative for color change.  Neurological: Negative for dizziness, weakness, light-headedness and numbness.  Psychiatric/Behavioral: Negative for agitation.     Physical Exam Updated Vital Signs BP 124/83   Pulse (!) 122   Temp 99.4 F (37.4 C) (Oral)   Resp 16   Ht 5' 5"  (1.651 m)   Wt 75.8 kg (167 lb)   SpO2 97%   BMI 27.79 kg/m   Physical Exam  Constitutional: She is oriented to person, place, and time. She appears well-developed and well-nourished. No distress.  No apparent distress, nontoxic-appearing.  HENT:  Head: Normocephalic and atraumatic.  Mouth/Throat: Oropharynx is clear and  moist.  Eyes: Pupils are equal, round, and reactive to light. Conjunctivae are normal. Right eye exhibits no discharge. Left eye exhibits no discharge.  Neck: Normal range of motion. Neck supple. No JVD present.  Cardiovascular:  Tachycardic, irregularly irregular rhythm.  No appreciable murmur.  Pulmonary/Chest: Effort normal and breath sounds normal. No stridor. No respiratory distress. She has no wheezes. She has no rales.  Abdominal: Soft. Bowel sounds are normal. There is no tenderness.  Musculoskeletal:  No leg swelling or calf tenderness.  Neurological: She is alert and oriented to person, place, and time. Coordination normal.  Skin: Skin is warm and dry. She is not diaphoretic.  Psychiatric: She has a normal mood and affect. Her behavior is normal.  Nursing note and vitals reviewed.    ED Treatments / Results  Labs (all labs ordered  are listed, but only abnormal results are displayed) Labs Reviewed  BASIC METABOLIC PANEL - Abnormal; Notable for the following components:      Result Value   Sodium 132 (*)    Glucose, Bld 131 (*)    All other components within normal limits  CBC - Abnormal; Notable for the following components:   WBC 14.7 (*)    Hemoglobin 10.5 (*)    HCT 34.1 (*)    RDW 17.0 (*)    All other components within normal limits  TSH  I-STAT TROPONIN, ED    EKG EKG Interpretation  Date/Time:  Thursday October 26 2017 16:43:44 EDT Ventricular Rate:  114 PR Interval:    QRS Duration: 124 QT Interval:  313 QTC Calculation: 431 R Axis:   -37 Text Interpretation:  Atrial fibrillation Nonspecific IVCD with LAD LVH with secondary repolarization abnormality Confirmed by Virgel Manifold [phone]) on 10/26/2017 6:36:19 PM   Radiology Dg Chest 2 View  Result Date: 10/26/2017 CLINICAL DATA:  Atrial fibrillation, tachycardia EXAM: CHEST - 2 VIEW COMPARISON:  Chest CT 06/22/2017 FINDINGS: Cardiomegaly. No confluent airspace opacities or effusions. No overt edema. No  acute bony abnormality. IMPRESSION: Cardiomegaly.  No active disease. Electronically Signed   By: Rolm Baptise M.D.   On: 10/26/2017 18:12    Procedures Procedures (including critical care time)  Medications Ordered in ED Medications  fentaNYL (SUBLIMAZE) injection 75 mcg (75 mcg Intravenous Given 10/26/17 1922)  etomidate (AMIDATE) injection 7.58 mg (7.58 mg Intravenous Given 10/26/17 1940)  apixaban (ELIQUIS) tablet 5 mg (5 mg Oral Given 10/26/17 2032)     Initial Impression / Assessment and Plan / ED Course  I have reviewed the triage vital signs and the nursing notes.  Pertinent labs & imaging results that were available during my care of the patient were reviewed by me and considered in my medical decision making (see chart for details).     This patients CHA2DS2-VASc Score and unadjusted Ischemic Stroke Rate (% per year) is equal to 3.2 % stroke rate/year from a score of 3  Above score calculated as 1 point each if present [CHF, HTN, DM, Vascular=MI/PAD/Aortic Plaque, Age if 65-74, or Female] Above score calculated as 2 points each if present [Age > 75, or Stroke/TIA/TE]  Presents with new onset A. fib with RVR.  She is hemodynamically stable. Provides a clear history of symptoms beginning early this morning.  Since symptoms began within 48 hours, feel that she is appropriate for cardioversion.  Discussed this patient with Dr. Wilson Singer who agrees.  Patient cardioverted to NSR by Dr. Wilson Singer under sedation at the bedside. Patient reports resolution of her symptoms following the procedure.  Otherwise, labwork reassuring. BMP without any major electrolyte abnormalities. Magnesium WNL. TSH WNL, I-stat troponin negative. No ischemic changes with EKG. I do not suspect ACS based on symptoms and results. No signs of fluid overload, no new murmurs and no history of CHF therefore do think acute CHF exacerbation. No PE risk factors. She has a cha2ds2vasc score of 3. Patient started on eliquis in the  ED and follow up with afib clinic. Discussed reasons to return to the ER and she agrees.  Final Clinical Impressions(s) / ED Diagnoses   Final diagnoses:  Atrial fibrillation with RVR [facility])    ED Discharge Orders    None       Bernarda Caffey 10/27/17 0148    Virgel Manifold, MD 10/29/17 [phone]

## 2017-10-26 NOTE — ED Notes (Signed)
Wasted 25 mcg of Fentanyl in sharps with Freida Busman, RN

## 2017-10-26 NOTE — ED Triage Notes (Signed)
Pt BIBA For c/o " just not feeling right since this morning "  Pt then decided to go to MD's office ;  Upon EMS arrival , pt was noted to be in Afib, no known hx ; pt denies any CP/ SOB / or Dizziness

## 2017-10-26 NOTE — ED Provider Notes (Signed)
Medical screening examination/treatment/procedure(s) were conducted as a shared visit with non-physician practitioner(s) and myself.  I personally evaluated the patient during the encounter.  EKG Interpretation  Date/Time:  Thursday October 26 2017 16:43:44 EDT Ventricular Rate:  114 PR Interval:    QRS Duration: 124 QT Interval:  313 QTC Calculation: 431 R Axis:   -37 Text Interpretation:  Atrial fibrillation Nonspecific IVCD with LAD LVH with secondary repolarization abnormality Confirmed by Virgel Manifold [phone]) on 10/26/2017 6:36:19 PM  New onset afib. Symptoms less than 24 hours. No obvious immediately correctable cause. She was cardioverted to sinus rhythm. Will anticouagalute. Follow-up with afib clinic. Return precautions discussed.  CHADSVASC: 78 (age, female).   .Cardioversion Date/Time: 10/26/2017 8:05 PM Performed by: Virgel Manifold, MD Authorized by: Virgel Manifold, MD   Consent:    Consent obtained:  Verbal and written   Consent given by:  Patient   Risks discussed:  Cutaneous burn, induced arrhythmia and pain   Alternatives discussed:  Rate-control medication, alternative treatment, anti-coagulation medication and delayed treatment Pre-procedure details:    Cardioversion basis:  Elective   Rhythm:  Atrial fibrillation   Electrode placement:  Anterior-posterior Patient sedated: Yes. Refer to sedation procedure documentation for details of sedation.  Attempt one:    Cardioversion mode:  Synchronous   Waveform:  Biphasic   Shock (Joules):  120   Shock outcome:  No change in rhythm Attempt two:    Cardioversion mode:  Synchronous   Waveform:  Biphasic   Shock (Joules):  150   Shock outcome:  No change in rhythm Attempt three:    Cardioversion mode:  Synchronous   Waveform:  Biphasic   Shock (Joules):  200   Shock outcome:  Conversion to normal sinus rhythm   CRITICAL CARE Performed by: Virgel Manifold Total critical care time: 35 minutes Critical care time was  exclusive of separately billable procedures and treating other patients. Critical care was necessary to treat or prevent imminent or life-threatening deterioration. Critical care was time spent personally by me on the following activities: development of treatment plan with patient and/or surrogate as well as nursing, discussions with consultants, evaluation of patient's response to treatment, examination of patient, obtaining history from patient or surrogate, ordering and performing treatments and interventions, ordering and review of laboratory studies, ordering and review of radiographic studies, pulse oximetry and re-evaluation of patient's condition.   PROCEDURAL SEDATION  Preprocedure  Pre-anesthesia/induction confirmation of laterality/correct procedure site including "time-out."  Provider confirms review of the nurses' note, allergies, medications, pertinent labs, PMH, pre-induction vital signs, pulse oximetry, pain level, and ECG (as applicable), and patient condition satisfactory for commencing with order for sedation and procedure.  Medications: see MAR  Patient tolerated procedure and procedural sedation component as expected without apparent immediate complications.  Physician confirms procedural medication orders as administered, patient was assessed by physician post-procedure, and confirms post-sedation plan of care and disposition.  Total time of sedation/monitoring: 32 minutes   Virgel Manifold, MD 10/29/17 2334

## 2017-11-03 ENCOUNTER — Ambulatory Visit (HOSPITAL_COMMUNITY)
Admission: RE | Admit: 2017-11-03 | Discharge: 2017-11-03 | Disposition: A | Discharge status: Home / Self Care | Payer: Medicare Other | Source: Ambulatory Visit | Attending: Nurse Practitioner | Admitting: Nurse Practitioner

## 2017-11-03 ENCOUNTER — Encounter (HOSPITAL_COMMUNITY): Payer: Self-pay | Admitting: Nurse Practitioner

## 2017-11-03 VITALS — BP 142/74 | HR 106 | Ht 65.0 in | Wt 169.0 lb

## 2017-11-03 DIAGNOSIS — Z8249 Family history of ischemic heart disease and other diseases of the circulatory system: Secondary | ICD-10-CM | POA: Insufficient documentation

## 2017-11-03 DIAGNOSIS — I480 Paroxysmal atrial fibrillation: Secondary | ICD-10-CM | POA: Insufficient documentation

## 2017-11-03 DIAGNOSIS — Z833 Family history of diabetes mellitus: Secondary | ICD-10-CM | POA: Diagnosis not present

## 2017-11-03 DIAGNOSIS — R0683 Snoring: Secondary | ICD-10-CM | POA: Diagnosis not present

## 2017-11-03 DIAGNOSIS — Z79899 Other long term (current) drug therapy: Secondary | ICD-10-CM | POA: Insufficient documentation

## 2017-11-03 DIAGNOSIS — Z7901 Long term (current) use of anticoagulants: Secondary | ICD-10-CM | POA: Insufficient documentation

## 2017-11-03 DIAGNOSIS — E559 Vitamin D deficiency, unspecified: Secondary | ICD-10-CM | POA: Diagnosis not present

## 2017-11-03 DIAGNOSIS — M1990 Unspecified osteoarthritis, unspecified site: Secondary | ICD-10-CM | POA: Insufficient documentation

## 2017-11-03 DIAGNOSIS — E785 Hyperlipidemia, unspecified: Secondary | ICD-10-CM | POA: Diagnosis not present

## 2017-11-03 DIAGNOSIS — M8580 Other specified disorders of bone density and structure, unspecified site: Secondary | ICD-10-CM | POA: Diagnosis not present

## 2017-11-03 DIAGNOSIS — K900 Celiac disease: Secondary | ICD-10-CM | POA: Insufficient documentation

## 2017-11-03 DIAGNOSIS — Z9842 Cataract extraction status, left eye: Secondary | ICD-10-CM | POA: Insufficient documentation

## 2017-11-03 DIAGNOSIS — E039 Hypothyroidism, unspecified: Secondary | ICD-10-CM | POA: Insufficient documentation

## 2017-11-03 DIAGNOSIS — I4891 Unspecified atrial fibrillation: Secondary | ICD-10-CM | POA: Diagnosis present

## 2017-11-03 DIAGNOSIS — Z7983 Long term (current) use of bisphosphonates: Secondary | ICD-10-CM | POA: Insufficient documentation

## 2017-11-03 DIAGNOSIS — Z79890 Hormone replacement therapy (postmenopausal): Secondary | ICD-10-CM | POA: Diagnosis not present

## 2017-11-03 DIAGNOSIS — Z800 Family history of malignant neoplasm of digestive organs: Secondary | ICD-10-CM | POA: Diagnosis not present

## 2017-11-03 DIAGNOSIS — Z9841 Cataract extraction status, right eye: Secondary | ICD-10-CM | POA: Insufficient documentation

## 2017-11-03 MED ORDER — METOPROLOL TARTRATE 25 MG PO TABS: 25.0000 mg | ORAL_TABLET | Freq: Two times a day (BID) | ORAL | 3 refills | Status: DC

## 2017-11-03 NOTE — Patient Instructions (Addendum)
Your physician has recommended you make the following change in your medication:  1)Start Metoprolol 25mg  twice a day  Scheduling will be in touch with you to set up sleep study after insurance approval.

## 2017-11-03 NOTE — Addendum Note (Signed)
Encounter addended by: Sherran Needs, NP on: 11/03/2017 3:22 PM  Actions taken: Visit diagnoses modified, Diagnosis association updated

## 2017-11-03 NOTE — Progress Notes (Signed)
Primary Care Physician: Shon Baton, MD Referring Physician: [facility] ER f/u   Susan Bell is a 77 y.o. female with a h/o HLD, hypothyroidism  that was successfully cardioverted in the [facility] ER 7/25 for new onset Afib. She is in the afib office today but has gone back into  afib and was unaware today.  She denies tobacco, alcohol, excessive caffeine. Has been told she snores and appears to stop breathing at night.  Today, she denies symptoms of palpitations, chest pain, shortness of breath, orthopnea, PND, lower extremity edema, dizziness, presyncope, syncope, or neurologic sequela. The patient is tolerating medications without difficulties and is otherwise without complaint today.   Past Medical History:  Diagnosis Date  . Allergy    seasonal  . Anemia   . Arthritis   . Cataract    bilateral removed  . Celiac disease   . Eczema   . H/O transfusion of platelets   . Hyperlipidemia   . Hypothyroidism   . Osteopenia   . Shingles   . Vitamin D deficiency    Past Surgical History:  Procedure Laterality Date  . CATARACT EXTRACTION     bilateral  . COLONOSCOPY    . DILATION AND CURETTAGE OF UTERUS    . UPPER GI ENDOSCOPY      Current Outpatient Medications  Medication Sig Dispense Refill  . alendronate (FOSAMAX) 70 MG tablet Take 70 mg by mouth every Tuesday.     Marland Kitchen apixaban (ELIQUIS) 5 MG TABS tablet Take 1 tablet (5 mg total) by mouth 2 (two) times daily. 60 tablet 0  . Ascorbic Acid (VITAMIN C ADULT GUMMIES PO) Take by mouth daily. Pt takes 2 gummies every day    . Calcium Carbonate-Vitamin D (CALCIUM 600 + D PO) Take 1 tablet by mouth daily.      . cholecalciferol (VITAMIN D-400) 400 UNITS TABS Take 400 Units by mouth daily.     Marland Kitchen escitalopram (LEXAPRO) 10 MG tablet Take 15 mg by mouth at bedtime.    Marland Kitchen levothyroxine (LEVOTHROID) 88 MCG tablet Take 88 mcg by mouth daily.      Marland Kitchen loratadine (CLARITIN) 10 MG tablet Take 10 mg by mouth daily.    Marland Kitchen losartan (COZAAR) 25 MG  tablet Take 25 mg daily by mouth.    . Multiple Vitamins-Minerals (MULTIVITAMIN WITH MINERALS) tablet Take 1 tablet daily by mouth.    . multivitamin-lutein (OCUVITE-LUTEIN) CAPS capsule Take 1 capsule daily by mouth.    Marland Kitchen olopatadine (PATANOL) 0.1 % ophthalmic solution Place 1 drop into both eyes as needed for allergies.     Marland Kitchen omeprazole (PRILOSEC) 20 MG capsule Take 20 mg by mouth at bedtime.    Marland Kitchen oxybutynin (DITROPAN) 5 MG tablet Take 2.5 mg every evening by mouth.    . rosuvastatin (CRESTOR) 5 MG tablet Take 5 mg by mouth daily.  3  . metoprolol tartrate (LOPRESSOR) 25 MG tablet Take 1 tablet (25 mg total) by mouth 2 (two) times daily. 60 tablet 3  . mupirocin ointment (BACTROBAN) 2 % Apply 1 application topically 2 (two) times daily. (Patient not taking: Reported on 11/03/2017) 30 g 2   No current facility-administered medications for this encounter.     Allergies  Allergen Reactions  . Betadine [Povidone Iodine]     itching    Social History   Socioeconomic History  . Marital status: Married    Spouse name: Not on file  . Number of children: 4  . Years of education:  Not on file  . Highest education level: Not on file  Occupational History  . Occupation: retired    Fish farm manager: RETIRED  Social Needs  . Financial resource strain: Not on file  . Food insecurity:    Worry: Not on file    Inability: Not on file  . Transportation needs:    Medical: Not on file    Non-medical: Not on file  Tobacco Use  . Smoking status: Never Smoker  . Smokeless tobacco: Never Used  Substance and Sexual Activity  . Alcohol use: No    Alcohol/week: 0.0 oz  . Drug use: No  . Sexual activity: Not on file  Lifestyle  . Physical activity:    Days per week: Not on file    Minutes per session: Not on file  . Stress: Not on file  Relationships  . Social connections:    Talks on phone: Not on file    Gets together: Not on file    Attends religious service: Not on file    Active member of club  or organization: Not on file    Attends meetings of clubs or organizations: Not on file    Relationship status: Not on file  . Intimate partner violence:    Fear of current or ex partner: Not on file    Emotionally abused: Not on file    Physically abused: Not on file    Forced sexual activity: Not on file  Other Topics Concern  . Not on file  Social History Narrative   Daily caffeine    Family History  Problem Relation Age of Onset  . Liver cancer Mother   . Diabetes Father   . Heart disease Father   . Colon cancer Neg Hx     ROS- All systems are reviewed and negative except as per the HPI above  Physical Exam: Vitals:   11/03/17 0839  BP: (!) 142/74  Pulse: (!) 106  Weight: 169 lb (76.7 kg)  Height: 5\' 5"  (1.651 m)   Wt Readings from Last 3 Encounters:  11/03/17 169 lb (76.7 kg)  10/26/17 167 lb (75.8 kg)  02/13/17 160 lb (72.6 kg)    Labs: Lab Results  Component Value Date   NA 132 (L) 10/26/2017   K 3.8 10/26/2017   CL 100 10/26/2017   CO2 24 10/26/2017   GLUCOSE 131 (H) 10/26/2017   BUN 10 10/26/2017   CREATININE 0.80 10/26/2017   CALCIUM 8.9 10/26/2017   MG 2.0 10/26/2017   No results found for: INR No results found for: CHOL, HDL, LDLCALC, TRIG   GEN- The patient is well appearing, alert and oriented x 3 today.   Head- normocephalic, atraumatic Eyes-  Sclera clear, conjunctiva pink Ears- hearing intact Oropharynx- clear Neck- supple, no JVP Lymph- no cervical lymphadenopathy Lungs- Clear to ausculation bilaterally, normal work of breathing Heart- irregular rate and rhythm, no murmurs, rubs or gallops, PMI not laterally displaced GI- soft, NT, ND, + BS Extremities- no clubbing, cyanosis, or edema MS- no significant deformity or atrophy Skin- no rash or lesion Psych- euthymic mood, full affect Neuro- strength and sensation are intact  EKG-afib at 106 bpm st/t wave abnormality consider lateral ischemia Epic records reviewed    Assessment  and Plan: 1. Paroxysmal afib Successful cardioversion in the ER 7/25 but with ERAF General education re afib Will add metoprolol 25 mg bid No obvious triggers other than possible sleep apnea Echo Sleep study requested  2. Chadsvasc score of  3 Continue eliquis 5 mg bid  Bleeding precautions discussed   F/u in clinic in one week Pt has requested to f/u with Dr. Percival Spanish, as he sees her husband, will request f/u with him in 2-3 months  Geroge Baseman. Malakhi Markwood, [facility] [address] [place], [place] [address] [phone]

## 2017-11-06 ENCOUNTER — Telehealth: Payer: Self-pay | Admitting: *Deleted

## 2017-11-06 NOTE — Telephone Encounter (Signed)
-----   Message from [name], [place] sent at 11/03/2017  1:34 PM EDT ----- Regarding: pre cert May be a duplicate ----- Message ----- From: Juluis Mire, RN Sent: 11/03/2017   9:28 AM To: [name], CMA Subject: sleep study                                    Pt needs sleep study for afib, snoring with periods of witnessed apnea. Thanks Nurse, adult [facility]

## 2017-11-06 NOTE — Telephone Encounter (Signed)
Staff message sent to Susan Bell patient has regular Medicare and does not need a PA. Ok to schedule sleep study.

## 2017-11-08 ENCOUNTER — Ambulatory Visit: Payer: BLUE CROSS/BLUE SHIELD | Admitting: Cardiology

## 2017-11-09 ENCOUNTER — Telehealth: Payer: Self-pay | Admitting: *Deleted

## 2017-11-09 NOTE — Telephone Encounter (Signed)
-----   Message from Lauralee Evener, [place] sent at 11/06/2017  4:31 PM EDT ----- Regarding: RE: pre cert Not sure why I got this. Patient has plain Medicare and does not need a PA. Ok to schedule sleep study. ----- Message ----- From: [name], CMA Sent: 11/03/2017   1:34 PM To: [facility] Sleep [facility] Subject: pre cert                                       May be a duplicate ----- Message ----- From: Juluis Mire, RN Sent: 11/03/2017   9:28 AM To: [name], CMA Subject: sleep study                                    Pt needs sleep study for afib, snoring with periods of witnessed apnea. Thanks Nurse, adult [facility]

## 2017-11-09 NOTE — Telephone Encounter (Signed)
Patient is scheduled for lab study on 12/01/17. Patient understands her sleep study will be done at [facility] sleep lab. Patient understands she will receive a sleep packet in a week or so. Patient understands to call if she does not receive the sleep packet in a timely manner. Patient agrees with treatment and thanked me for call.

## 2017-11-10 ENCOUNTER — Encounter (HOSPITAL_COMMUNITY): Payer: Self-pay | Admitting: Nurse Practitioner

## 2017-11-10 ENCOUNTER — Ambulatory Visit (HOSPITAL_COMMUNITY)
Admission: RE | Admit: 2017-11-10 | Discharge: 2017-11-10 | Disposition: A | Discharge status: Home / Self Care | Payer: Medicare Other | Source: Ambulatory Visit | Attending: Nurse Practitioner | Admitting: Nurse Practitioner

## 2017-11-10 VITALS — BP 122/72 | HR 83 | Ht 65.0 in | Wt 167.0 lb

## 2017-11-10 DIAGNOSIS — M8580 Other specified disorders of bone density and structure, unspecified site: Secondary | ICD-10-CM | POA: Insufficient documentation

## 2017-11-10 DIAGNOSIS — Z800 Family history of malignant neoplasm of digestive organs: Secondary | ICD-10-CM | POA: Diagnosis not present

## 2017-11-10 DIAGNOSIS — Z7901 Long term (current) use of anticoagulants: Secondary | ICD-10-CM | POA: Diagnosis not present

## 2017-11-10 DIAGNOSIS — Z7983 Long term (current) use of bisphosphonates: Secondary | ICD-10-CM | POA: Insufficient documentation

## 2017-11-10 DIAGNOSIS — K900 Celiac disease: Secondary | ICD-10-CM | POA: Diagnosis not present

## 2017-11-10 DIAGNOSIS — E785 Hyperlipidemia, unspecified: Secondary | ICD-10-CM | POA: Insufficient documentation

## 2017-11-10 DIAGNOSIS — Z9841 Cataract extraction status, right eye: Secondary | ICD-10-CM | POA: Diagnosis not present

## 2017-11-10 DIAGNOSIS — R0683 Snoring: Secondary | ICD-10-CM | POA: Insufficient documentation

## 2017-11-10 DIAGNOSIS — E039 Hypothyroidism, unspecified: Secondary | ICD-10-CM | POA: Insufficient documentation

## 2017-11-10 DIAGNOSIS — I480 Paroxysmal atrial fibrillation: Secondary | ICD-10-CM | POA: Diagnosis not present

## 2017-11-10 DIAGNOSIS — Z79899 Other long term (current) drug therapy: Secondary | ICD-10-CM | POA: Diagnosis not present

## 2017-11-10 DIAGNOSIS — Z8249 Family history of ischemic heart disease and other diseases of the circulatory system: Secondary | ICD-10-CM | POA: Insufficient documentation

## 2017-11-10 DIAGNOSIS — I481 Persistent atrial fibrillation: Secondary | ICD-10-CM

## 2017-11-10 DIAGNOSIS — Z833 Family history of diabetes mellitus: Secondary | ICD-10-CM | POA: Insufficient documentation

## 2017-11-10 DIAGNOSIS — Z79890 Hormone replacement therapy (postmenopausal): Secondary | ICD-10-CM | POA: Insufficient documentation

## 2017-11-10 DIAGNOSIS — M1990 Unspecified osteoarthritis, unspecified site: Secondary | ICD-10-CM | POA: Insufficient documentation

## 2017-11-10 DIAGNOSIS — Z9842 Cataract extraction status, left eye: Secondary | ICD-10-CM | POA: Diagnosis not present

## 2017-11-10 DIAGNOSIS — E559 Vitamin D deficiency, unspecified: Secondary | ICD-10-CM | POA: Diagnosis not present

## 2017-11-10 DIAGNOSIS — I4891 Unspecified atrial fibrillation: Secondary | ICD-10-CM | POA: Diagnosis present

## 2017-11-10 DIAGNOSIS — I4819 Other persistent atrial fibrillation: Secondary | ICD-10-CM

## 2017-11-10 MED ORDER — APIXABAN 5 MG PO TABS: 5.0000 mg | ORAL_TABLET | Freq: Two times a day (BID) | ORAL | 1 refills | Status: DC

## 2017-11-10 NOTE — Progress Notes (Signed)
Primary Care Physician: Shon Baton, MD Referring Physician: [facility] ER f/u   Susan Bell is a 77 y.o. female with a h/o HLD, hypothyroidism  that was successfully cardioverted in the [facility] ER 7/25 for new onset Afib. She returned to  the afib office 8/2 but had gone back into  afib and was unaware. She was started on BB for better rate control and today has afib in the 80's.   She denies tobacco, alcohol, excessive caffeine. Has been told she snores and appears to stop breathing at night. She is still pending an echo which is scheduled next week. She appears to be tolerating afib well.  Today, she denies symptoms of palpitations, chest pain, shortness of breath, orthopnea, PND, lower extremity edema, dizziness, presyncope, syncope, or neurologic sequela. The patient is tolerating medications without difficulties and is otherwise without complaint today.   Past Medical History:  Diagnosis Date  . Allergy    seasonal  . Anemia   . Arthritis   . Cataract    bilateral removed  . Celiac disease   . Eczema   . H/O transfusion of platelets   . Hyperlipidemia   . Hypothyroidism   . Osteopenia   . Shingles   . Vitamin D deficiency    Past Surgical History:  Procedure Laterality Date  . CATARACT EXTRACTION     bilateral  . COLONOSCOPY    . DILATION AND CURETTAGE OF UTERUS    . UPPER GI ENDOSCOPY      Current Outpatient Medications  Medication Sig Dispense Refill  . alendronate (FOSAMAX) 70 MG tablet Take 70 mg by mouth every Tuesday.     Marland Kitchen apixaban (ELIQUIS) 5 MG TABS tablet Take 1 tablet (5 mg total) by mouth 2 (two) times daily. 180 tablet 1  . Ascorbic Acid (VITAMIN C ADULT GUMMIES PO) Take by mouth daily. Pt takes 2 gummies every day    . Calcium Carbonate-Vitamin D (CALCIUM 600 + D PO) Take 1 tablet by mouth daily.      . cholecalciferol (VITAMIN D-400) 400 UNITS TABS Take 400 Units by mouth daily.     Marland Kitchen escitalopram (LEXAPRO) 10 MG tablet Take 15 mg by mouth at bedtime.      Marland Kitchen levothyroxine (LEVOTHROID) 88 MCG tablet Take 88 mcg by mouth daily.      Marland Kitchen loratadine (CLARITIN) 10 MG tablet Take 10 mg by mouth daily.    Marland Kitchen losartan (COZAAR) 25 MG tablet Take 25 mg daily by mouth.    . metoprolol tartrate (LOPRESSOR) 25 MG tablet Take 1 tablet (25 mg total) by mouth 2 (two) times daily. 60 tablet 3  . Multiple Vitamins-Minerals (MULTIVITAMIN WITH MINERALS) tablet Take 1 tablet daily by mouth.    . multivitamin-lutein (OCUVITE-LUTEIN) CAPS capsule Take 1 capsule daily by mouth.    . mupirocin ointment (BACTROBAN) 2 % Apply 1 application topically 2 (two) times daily. 30 g 2  . olopatadine (PATANOL) 0.1 % ophthalmic solution Place 1 drop into both eyes as needed for allergies.     Marland Kitchen omeprazole (PRILOSEC) 20 MG capsule Take 20 mg by mouth at bedtime.    Marland Kitchen oxybutynin (DITROPAN) 5 MG tablet Take 2.5 mg every evening by mouth.    . rosuvastatin (CRESTOR) 5 MG tablet Take 5 mg by mouth daily.  3   No current facility-administered medications for this encounter.     Allergies  Allergen Reactions  . Betadine [Povidone Iodine]     itching  Social History   Socioeconomic History  . Marital status: Married    Spouse name: Not on file  . Number of children: 4  . Years of education: Not on file  . Highest education level: Not on file  Occupational History  . Occupation: retired    Fish farm manager: RETIRED  Social Needs  . Financial resource strain: Not on file  . Food insecurity:    Worry: Not on file    Inability: Not on file  . Transportation needs:    Medical: Not on file    Non-medical: Not on file  Tobacco Use  . Smoking status: Never Smoker  . Smokeless tobacco: Never Used  Substance and Sexual Activity  . Alcohol use: No    Alcohol/week: 0.0 standard drinks  . Drug use: No  . Sexual activity: Not on file  Lifestyle  . Physical activity:    Days per week: Not on file    Minutes per session: Not on file  . Stress: Not on file  Relationships  . Social  connections:    Talks on phone: Not on file    Gets together: Not on file    Attends religious service: Not on file    Active member of club or organization: Not on file    Attends meetings of clubs or organizations: Not on file    Relationship status: Not on file  . Intimate partner violence:    Fear of current or ex partner: Not on file    Emotionally abused: Not on file    Physically abused: Not on file    Forced sexual activity: Not on file  Other Topics Concern  . Not on file  Social History Narrative   Daily caffeine    Family History  Problem Relation Age of Onset  . Liver cancer Mother   . Diabetes Father   . Heart disease Father   . Colon cancer Neg Hx     ROS- All systems are reviewed and negative except as per the HPI above  Physical Exam: Vitals:   11/10/17 0843  BP: 122/72  Pulse: 83  Weight: 75.8 kg  Height: 5\' 5"  (1.651 m)   Wt Readings from Last 3 Encounters:  11/10/17 75.8 kg  11/03/17 76.7 kg  10/26/17 75.8 kg    Labs: Lab Results  Component Value Date   NA 132 (L) 10/26/2017   K 3.8 10/26/2017   CL 100 10/26/2017   CO2 24 10/26/2017   GLUCOSE 131 (H) 10/26/2017   BUN 10 10/26/2017   CREATININE 0.80 10/26/2017   CALCIUM 8.9 10/26/2017   MG 2.0 10/26/2017   No results found for: INR No results found for: CHOL, HDL, LDLCALC, TRIG   GEN- The patient is well appearing, alert and oriented x 3 today.   Head- normocephalic, atraumatic Eyes-  Sclera clear, conjunctiva pink Ears- hearing intact Oropharynx- clear Neck- supple, no JVP Lymph- no cervical lymphadenopathy Lungs- Clear to ausculation bilaterally, normal work of breathing Heart- irregular rate and rhythm, no murmurs, rubs or gallops, PMI not laterally displaced GI- soft, NT, ND, + BS Extremities- no clubbing, cyanosis, or edema MS- no significant deformity or atrophy Skin- no rash or lesion Psych- euthymic mood, full affect Neuro- strength and sensation are intact  EKG-afib  at 83 bpm   Epic records reviewed    Assessment and Plan: 1. Paroxysmal afib Successful cardioversion in the ER 7/25 but with ERAF Continue  metoprolol 25 mg bid with good rate control No  obvious triggers other than possible sleep apnea Echo Sleep study requested  2. Chadsvasc score of  3 Continue eliquis 5 mg bid  Bleeding precautions discussed  Will see pt back after the above studies and further discuss rhythm control  Reeva Davern C. [place], [facility] [address] [place], [place] [address] [phone]

## 2017-11-13 ENCOUNTER — Ambulatory Visit (HOSPITAL_COMMUNITY)
Admission: RE | Admit: 2017-11-13 | Discharge: 2017-11-13 | Disposition: A | Discharge status: Home / Self Care | Payer: Medicare Other | Source: Ambulatory Visit | Attending: Nurse Practitioner | Admitting: Nurse Practitioner

## 2017-11-13 DIAGNOSIS — I480 Paroxysmal atrial fibrillation: Secondary | ICD-10-CM | POA: Diagnosis not present

## 2017-11-13 NOTE — Progress Notes (Signed)
  Echocardiogram 2D Echocardiogram has been performed.  Susan Bell 11/13/2017, 3:20 PM

## 2017-11-20 ENCOUNTER — Encounter (HOSPITAL_COMMUNITY): Payer: Self-pay | Admitting: Nurse Practitioner

## 2017-11-20 ENCOUNTER — Ambulatory Visit (HOSPITAL_COMMUNITY)
Admission: RE | Admit: 2017-11-20 | Discharge: 2017-11-20 | Disposition: A | Discharge status: Home / Self Care | Payer: Medicare Other | Source: Ambulatory Visit | Attending: Nurse Practitioner | Admitting: Nurse Practitioner

## 2017-11-20 VITALS — BP 130/76 | HR 92 | Ht 65.0 in | Wt 167.2 lb

## 2017-11-20 DIAGNOSIS — Z7983 Long term (current) use of bisphosphonates: Secondary | ICD-10-CM | POA: Diagnosis not present

## 2017-11-20 DIAGNOSIS — R9431 Abnormal electrocardiogram [ECG] [EKG]: Secondary | ICD-10-CM | POA: Insufficient documentation

## 2017-11-20 DIAGNOSIS — E559 Vitamin D deficiency, unspecified: Secondary | ICD-10-CM | POA: Insufficient documentation

## 2017-11-20 DIAGNOSIS — Z888 Allergy status to other drugs, medicaments and biological substances status: Secondary | ICD-10-CM | POA: Diagnosis not present

## 2017-11-20 DIAGNOSIS — Z7901 Long term (current) use of anticoagulants: Secondary | ICD-10-CM | POA: Insufficient documentation

## 2017-11-20 DIAGNOSIS — M8580 Other specified disorders of bone density and structure, unspecified site: Secondary | ICD-10-CM | POA: Diagnosis not present

## 2017-11-20 DIAGNOSIS — E785 Hyperlipidemia, unspecified: Secondary | ICD-10-CM | POA: Insufficient documentation

## 2017-11-20 DIAGNOSIS — Z79899 Other long term (current) drug therapy: Secondary | ICD-10-CM | POA: Insufficient documentation

## 2017-11-20 DIAGNOSIS — Z800 Family history of malignant neoplasm of digestive organs: Secondary | ICD-10-CM | POA: Diagnosis not present

## 2017-11-20 DIAGNOSIS — Z9842 Cataract extraction status, left eye: Secondary | ICD-10-CM | POA: Diagnosis not present

## 2017-11-20 DIAGNOSIS — I519 Heart disease, unspecified: Secondary | ICD-10-CM

## 2017-11-20 DIAGNOSIS — E039 Hypothyroidism, unspecified: Secondary | ICD-10-CM | POA: Insufficient documentation

## 2017-11-20 DIAGNOSIS — I480 Paroxysmal atrial fibrillation: Secondary | ICD-10-CM | POA: Insufficient documentation

## 2017-11-20 DIAGNOSIS — Z9841 Cataract extraction status, right eye: Secondary | ICD-10-CM | POA: Insufficient documentation

## 2017-11-20 DIAGNOSIS — K900 Celiac disease: Secondary | ICD-10-CM | POA: Diagnosis not present

## 2017-11-20 NOTE — Progress Notes (Signed)
Primary Care Physician: Shon Baton, MD Referring Physician: [facility] ER f/u   Susan Bell is a 77 y.o. female with a h/o HLD, hypothyroidism  that was successfully cardioverted in the [facility] ER 7/25 for new onset Afib. She returned to  the afib office 8/2 but had gone back into  afib and was unaware. She was started on BB for better rate control.   She denies tobacco, alcohol, excessive caffeine. Has been told she snores and appears to stop breathing at night. She was pending an echo which is scheduled next week. She appears to be tolerating afib well.  F/u in afib clinic, 8/19. I asked her to come back to discuss her abnormal echo, which showed an EF of 35 %, diffuse hypokinesis. She remains in afib, rate controlled. She denies any exertional chest pain, is fatigued and has noted shortness of breath more so with steps.   Today, she denies symptoms of palpitations, chest pain, shortness of breath, orthopnea, PND, lower extremity edema, dizziness, presyncope, syncope, or neurologic sequela. The patient is tolerating medications without difficulties and is otherwise without complaint today.   Past Medical History:  Diagnosis Date  . Allergy    seasonal  . Anemia   . Arthritis   . Cataract    bilateral removed  . Celiac disease   . Eczema   . H/O transfusion of platelets   . Hyperlipidemia   . Hypothyroidism   . Osteopenia   . Shingles   . Vitamin D deficiency    Past Surgical History:  Procedure Laterality Date  . CATARACT EXTRACTION     bilateral  . COLONOSCOPY    . DILATION AND CURETTAGE OF UTERUS    . UPPER GI ENDOSCOPY      Current Outpatient Medications  Medication Sig Dispense Refill  . alendronate (FOSAMAX) 70 MG tablet Take 70 mg by mouth every Tuesday.     Marland Kitchen apixaban (ELIQUIS) 5 MG TABS tablet Take 1 tablet (5 mg total) by mouth 2 (two) times daily. 180 tablet 1  . Ascorbic Acid (VITAMIN C ADULT GUMMIES PO) Take by mouth daily. Pt takes 2 gummies every day    .  Calcium Carbonate-Vitamin D (CALCIUM 600 + D PO) Take 1 tablet by mouth daily.      . cholecalciferol (VITAMIN D-400) 400 UNITS TABS Take 400 Units by mouth daily.     Marland Kitchen escitalopram (LEXAPRO) 10 MG tablet Take 15 mg by mouth at bedtime.    Marland Kitchen levothyroxine (LEVOTHROID) 88 MCG tablet Take 88 mcg by mouth daily.      Marland Kitchen loratadine (CLARITIN) 10 MG tablet Take 10 mg by mouth daily.    Marland Kitchen losartan (COZAAR) 25 MG tablet Take 25 mg daily by mouth.    . metoprolol tartrate (LOPRESSOR) 25 MG tablet Take 1 tablet (25 mg total) by mouth 2 (two) times daily. 60 tablet 3  . Multiple Vitamins-Minerals (MULTIVITAMIN WITH MINERALS) tablet Take 1 tablet daily by mouth.    . multivitamin-lutein (OCUVITE-LUTEIN) CAPS capsule Take 1 capsule daily by mouth.    . mupirocin ointment (BACTROBAN) 2 % Apply 1 application topically 2 (two) times daily. 30 g 2  . olopatadine (PATANOL) 0.1 % ophthalmic solution Place 1 drop into both eyes as needed for allergies.     Marland Kitchen omeprazole (PRILOSEC) 20 MG capsule Take 20 mg by mouth at bedtime.    Marland Kitchen oxybutynin (DITROPAN) 5 MG tablet Take 2.5 mg every evening by mouth.    . rosuvastatin (  CRESTOR) 5 MG tablet Take 5 mg by mouth daily.  3   No current facility-administered medications for this encounter.     Allergies  Allergen Reactions  . Betadine [Povidone Iodine]     itching    Social History   Socioeconomic History  . Marital status: Married    Spouse name: Not on file  . Number of children: 4  . Years of education: Not on file  . Highest education level: Not on file  Occupational History  . Occupation: retired    Fish farm manager: RETIRED  Social Needs  . Financial resource strain: Not on file  . Food insecurity:    Worry: Not on file    Inability: Not on file  . Transportation needs:    Medical: Not on file    Non-medical: Not on file  Tobacco Use  . Smoking status: Never Smoker  . Smokeless tobacco: Never Used  Substance and Sexual Activity  . Alcohol use: No      Alcohol/week: 0.0 standard drinks  . Drug use: No  . Sexual activity: Not on file  Lifestyle  . Physical activity:    Days per week: Not on file    Minutes per session: Not on file  . Stress: Not on file  Relationships  . Social connections:    Talks on phone: Not on file    Gets together: Not on file    Attends religious service: Not on file    Active member of club or organization: Not on file    Attends meetings of clubs or organizations: Not on file    Relationship status: Not on file  . Intimate partner violence:    Fear of current or ex partner: Not on file    Emotionally abused: Not on file    Physically abused: Not on file    Forced sexual activity: Not on file  Other Topics Concern  . Not on file  Social History Narrative   Daily caffeine    Family History  Problem Relation Age of Onset  . Liver cancer Mother   . Diabetes Father   . Heart disease Father   . Colon cancer Neg Hx     ROS- All systems are reviewed and negative except as per the HPI above  Physical Exam: Vitals:   11/20/17 1550  BP: 130/76  Pulse: 92  Weight: 75.8 kg  Height: 5\' 5"  (1.651 m)   Wt Readings from Last 3 Encounters:  11/20/17 75.8 kg  11/10/17 75.8 kg  11/03/17 76.7 kg    Labs: Lab Results  Component Value Date   NA 132 (L) 10/26/2017   K 3.8 10/26/2017   CL 100 10/26/2017   CO2 24 10/26/2017   GLUCOSE 131 (H) 10/26/2017   BUN 10 10/26/2017   CREATININE 0.80 10/26/2017   CALCIUM 8.9 10/26/2017   MG 2.0 10/26/2017   No results found for: INR No results found for: CHOL, HDL, LDLCALC, TRIG   GEN- The patient is well appearing, alert and oriented x 3 today.   Head- normocephalic, atraumatic Eyes-  Sclera clear, conjunctiva pink Ears- hearing intact Oropharynx- clear Neck- supple, no JVP Lymph- no cervical lymphadenopathy Lungs- Clear to ausculation bilaterally, normal work of breathing Heart- irregular rate and rhythm, no murmurs, rubs or gallops, PMI not  laterally displaced GI- soft, NT, ND, + BS Extremities- no clubbing, cyanosis, or edema MS- no significant deformity or atrophy Skin- no rash or lesion Psych- euthymic mood, full affect Neuro- strength  and sensation are intact  EKG-afib at 93 bpm, qrs int 116 ms, qtc 455 ms   Epic records reviewed Echo-Study Conclusions  - Left ventricle: The cavity size was normal. Wall thickness was   increased in a pattern of mild LVH. The estimated ejection   fraction was 35%. Diffuse hypokinesis. The study was not   technically sufficient to allow evaluation of LV diastolic   dysfunction due to atrial fibrillation. - Aortic valve: Trileaflet; moderately calcified leaflets.   Sclerosis without stenosis. - Mitral valve: Mildly calcified annulus. There was trivial   regurgitation. - Left atrium: The atrium was moderately dilated. - Right ventricle: The cavity size was normal. Systolic function   was mildly reduced. - Right atrium: The atrium was moderately dilated. - Tricuspid valve: Peak RV-RA gradient (S): 25 mm Hg. - Pulmonary arteries: PA peak pressure: 28 mm Hg (S). - Inferior vena cava: The vessel was normal in size. The   respirophasic diameter changes were in the normal range (>= 50%),   consistent with normal central venous pressure.  Impressions:  - The patient was in atrial fibrillation. Normal LV size with mild   LV hypertrophy. EF 35%, diffuse hypokinesis. Normal RV size with   mildly decreased systolic function. Moderate biatrial   enlargement.   Assessment and Plan: 1. Paroxysmal afib Successful cardioversion in the ER 7/25 but with ERAF Continue  metoprolol 25 mg bid with good rate control No obvious triggers other than possible sleep apnea  Sleep study pending 8/30 Echo reviewed with pt and son Sleep study requested  2. LV dysfunction at 35% Discussed with pt that reduced EF may be a consequence of the  afib But to be on the safe side, will order a Lexi Myoview  to assess for any ischemia If that test is low risk, will further discuss antiarrythmic's We discussed amiodarone, sotalol or tikosyn today  3. Chadsvasc score of  3 Continue eliquis 5 mg bid, states no missed doses   Bleeding precautions discussed  Will further discuss with pt once stress test results are known F/u with Dr. Percival Spanish as scheduled to get established 10/1  Geroge Baseman. Arland Usery, [facility] [address] [place], [place] [address] [phone]

## 2017-11-23 ENCOUNTER — Telehealth (HOSPITAL_COMMUNITY): Payer: Self-pay | Admitting: *Deleted

## 2017-11-23 NOTE — Telephone Encounter (Signed)
Patient given detailed instructions per Myocardial Perfusion Study Information Sheet for the test on  11/27/17 Patient notified to arrive 15 minutes early and that it is imperative to arrive on time for appointment to keep from having the test rescheduled.  If you need to cancel or reschedule your appointment, please call the office within 24 hours of your appointment. . Patient verbalized understanding. Kirstie Peri

## 2017-11-24 ENCOUNTER — Telehealth (HOSPITAL_COMMUNITY): Payer: Self-pay | Admitting: *Deleted

## 2017-11-24 NOTE — Telephone Encounter (Signed)
Pt cld to discuss Tikosyn, amiodarone, and sotalol with Butch Penny.  Pt stated that she checked prices and the amiodarone is more expensive than the sotalol.  Pt also said that the tikosyn would avg out to $80 a month.  Pt wants Butch Penny to discuss which she feels would be best given her hx and current situation. Butch Penny will call to discuss

## 2017-11-27 ENCOUNTER — Ambulatory Visit (HOSPITAL_COMMUNITY): Payer: Medicare Other | Attending: Nurse Practitioner

## 2017-11-27 DIAGNOSIS — R002 Palpitations: Secondary | ICD-10-CM | POA: Insufficient documentation

## 2017-11-27 DIAGNOSIS — R0602 Shortness of breath: Secondary | ICD-10-CM | POA: Diagnosis not present

## 2017-11-27 DIAGNOSIS — R5383 Other fatigue: Secondary | ICD-10-CM | POA: Insufficient documentation

## 2017-11-27 DIAGNOSIS — R9431 Abnormal electrocardiogram [ECG] [EKG]: Secondary | ICD-10-CM

## 2017-11-27 LAB — MYOCARDIAL PERFUSION IMAGING
LV dias vol: 107 mL (ref 46–106)
LV sys vol: 73 mL
Peak HR: 106 {beats}/min
RATE: 0.39
Rest HR: 95 {beats}/min
SDS: 5
SRS: 13
SSS: 18
TID: 0.98

## 2017-11-27 MED ORDER — TECHNETIUM TC 99M TETROFOSMIN IV KIT
32.4000 | PACK | Freq: Once | INTRAVENOUS | Status: AC | PRN
  Administered 2017-11-27: 32.4 via INTRAVENOUS
  Filled 2017-11-27: qty 33

## 2017-11-27 MED ORDER — REGADENOSON 0.4 MG/5ML IV SOLN
0.4000 mg | Freq: Once | INTRAVENOUS | Status: AC
  Administered 2017-11-27: 0.4 mg via INTRAVENOUS

## 2017-11-27 MED ORDER — TECHNETIUM TC 99M TETROFOSMIN IV KIT
11.0000 | PACK | Freq: Once | INTRAVENOUS | Status: AC | PRN
  Administered 2017-11-27: 11 via INTRAVENOUS
  Filled 2017-11-27: qty 11

## 2017-11-30 DIAGNOSIS — I4891 Unspecified atrial fibrillation: Secondary | ICD-10-CM | POA: Diagnosis not present

## 2017-11-30 DIAGNOSIS — M859 Disorder of bone density and structure, unspecified: Secondary | ICD-10-CM | POA: Diagnosis not present

## 2017-11-30 DIAGNOSIS — E038 Other specified hypothyroidism: Secondary | ICD-10-CM | POA: Diagnosis not present

## 2017-11-30 DIAGNOSIS — Z23 Encounter for immunization: Secondary | ICD-10-CM | POA: Diagnosis not present

## 2017-11-30 DIAGNOSIS — D692 Other nonthrombocytopenic purpura: Secondary | ICD-10-CM | POA: Diagnosis not present

## 2017-11-30 DIAGNOSIS — Z6827 Body mass index (BMI) 27.0-27.9, adult: Secondary | ICD-10-CM | POA: Diagnosis not present

## 2017-11-30 DIAGNOSIS — K900 Celiac disease: Secondary | ICD-10-CM | POA: Diagnosis not present

## 2017-11-30 DIAGNOSIS — F325 Major depressive disorder, single episode, in full remission: Secondary | ICD-10-CM | POA: Diagnosis not present

## 2017-11-30 DIAGNOSIS — E7849 Other hyperlipidemia: Secondary | ICD-10-CM | POA: Diagnosis not present

## 2017-11-30 DIAGNOSIS — I5022 Chronic systolic (congestive) heart failure: Secondary | ICD-10-CM | POA: Diagnosis not present

## 2017-11-30 DIAGNOSIS — I10 Essential (primary) hypertension: Secondary | ICD-10-CM | POA: Diagnosis not present

## 2017-12-01 ENCOUNTER — Ambulatory Visit (HOSPITAL_BASED_OUTPATIENT_CLINIC_OR_DEPARTMENT_OTHER): Payer: Medicare Other | Attending: Nurse Practitioner | Admitting: Cardiology

## 2017-12-01 DIAGNOSIS — G4733 Obstructive sleep apnea (adult) (pediatric): Secondary | ICD-10-CM | POA: Diagnosis not present

## 2017-12-01 DIAGNOSIS — I480 Paroxysmal atrial fibrillation: Secondary | ICD-10-CM | POA: Diagnosis not present

## 2017-12-03 NOTE — Procedures (Signed)
   Patient Name: Susan Bell, Susan Bell Study Date:03/21/2017 12/01/2017 Gender: Female D.O.B: 08-19-1940 Age (years): 77 Referring Provider: Sherran Needs Height (inches): 53 Interpreting Physician: Fransico Him MD, ABSM Weight (lbs): 168 RPSGT: Earney Hamburg BMI: 28 MRN: [id number] Neck Size: 15.00  CLINICAL INFORMATION Sleep Study Type: NPSG  Indication for sleep study: N/A  Epworth Sleepiness Score: 3  SLEEP STUDY TECHNIQUE As per the AASM Manual for the Scoring of Sleep and Associated Events v2.3 (April 2016) with a hypopnea requiring 4% desaturations.  The channels recorded and monitored were frontal, central and occipital EEG, electrooculogram (EOG), submentalis EMG (chin), nasal and oral airflow, thoracic and abdominal wall motion, anterior tibialis EMG, snore microphone, electrocardiogram, and pulse oximetry.  MEDICATIONS Medications self-administered by patient taken the night of the study : N/A  SLEEP ARCHITECTURE The study was initiated at 11:03:34 PM and ended at 5:15:03 AM.  Sleep onset time was 78.4 minutes and the sleep efficiency was 67.5%%. The total sleep time was 250.6 minutes.  Stage REM latency was 113.0 minutes.  The patient spent 2.2%% of the night in stage N1 sleep, 84.4%% in stage N2 sleep, 0.0%% in stage N3 and 13.4% in REM.  Alpha intrusion was absent.  Supine sleep was 37.11%.  RESPIRATORY PARAMETERS The overall apnea/hypopnea index (AHI) was 12.7 per hour. There were 16 total apneas, including 13 obstructive, 3 central and 0 mixed apneas. There were 37 hypopneas and 33 RERAs.  The AHI during Stage REM sleep was 17.9 per hour.  AHI while supine was 33.5 per hour.  The mean oxygen saturation was 94.2%. The minimum SpO2 during sleep was 85.0%.  moderate snoring was noted during this study.  CARDIAC DATA The 2 lead EKG demonstrated atrial fibrillation. The mean heart rate was 78.0 beats per minute.   LEG MOVEMENT DATA The total PLMS  were 0 with a resulting PLMS index of 0.0. Associated arousal with leg movement index was 8.6 .  IMPRESSIONS - Mild obstructive sleep apnea occurred during this study (AHI = 12.7/h). - No significant central sleep apnea occurred during this study (CAI = 0.7/h). - Mild oxygen desaturation was noted during this study (Min O2 = 85.0%). - The patient snored with moderate snoring volume. - EKG findings include Atrial Fibrillation. - Clinically significant periodic limb movements did not occur during sleep. Associated arousals were significant.  DIAGNOSIS - Obstructive Sleep Apnea (327.23 [G47.33 ICD-10]  RECOMMENDATIONS - Therapeutic CPAP titration to determine optimal pressure required to alleviate sleep disordered breathing. - Positional therapy avoiding supine position during sleep. - Avoid alcohol, sedatives and other CNS depressants that may worsen sleep apnea and disrupt normal sleep architecture. - Sleep hygiene should be reviewed to assess factors that may improve sleep quality. - Weight management and regular exercise should be initiated or continued if appropriate.  [Electronically signed] 12/03/2017 10:09 PM  Fransico Him MD, ABSM Diplomate, American Board of Sleep Medicine

## 2017-12-05 ENCOUNTER — Other Ambulatory Visit (HOSPITAL_BASED_OUTPATIENT_CLINIC_OR_DEPARTMENT_OTHER): Payer: Self-pay

## 2017-12-05 DIAGNOSIS — I480 Paroxysmal atrial fibrillation: Secondary | ICD-10-CM

## 2017-12-06 ENCOUNTER — Telehealth: Payer: Self-pay | Admitting: Pharmacist

## 2017-12-06 NOTE — Telephone Encounter (Signed)
Medication list reviewed in anticipation of upcoming Tikosyn initiation. Patient is taking Lexapro, which can prolong QTc. This is listed as a category D interaction and if possible an alternate therapy should be started. Based on her current medication list she is not taking any other contraindicated or QTc prolonging medications. Potassium on panel one month ago was low and supplementation or repeat should be considered. Mag was ok at that time.  Patient is anticoagulated on Eliquis on the appropriate dose. Please ensure that patient has not missed any anticoagulation doses in the 3 weeks prior to Tikosyn initiation.   Patient will need to be counseled to avoid use of Benadryl while on Tikosyn and in the 2-3 days prior to Tikosyn initiation.

## 2017-12-08 ENCOUNTER — Telehealth: Payer: Self-pay | Admitting: *Deleted

## 2017-12-08 ENCOUNTER — Ambulatory Visit (HOSPITAL_COMMUNITY): Payer: Medicare Other | Admitting: Nurse Practitioner

## 2017-12-08 DIAGNOSIS — G4733 Obstructive sleep apnea (adult) (pediatric): Secondary | ICD-10-CM

## 2017-12-08 NOTE — Telephone Encounter (Signed)
Pt cld reporting that she take her last dose of lexapro on Sunday 12/10/17.  She is currently weaning and at 5 mg daily.

## 2017-12-08 NOTE — Telephone Encounter (Signed)
Staff message sent to Susan Bell no PA is required. Patient has Medicare and a BCBS supplement. Ok to schedule COAO titration.

## 2017-12-08 NOTE — Telephone Encounter (Signed)
Informed patient of sleep study results and patient understanding was verbalized. Patient understands her sleep study showed they have sleep apnea and recommend CPAP titration. Pt is aware and agreeable to these results. 

## 2017-12-08 NOTE — Telephone Encounter (Signed)
-----   Message from [name], [place] sent at 12/08/2017 12:01 PM EDT ----- Regarding: cpap titration cpap titration

## 2017-12-08 NOTE — Telephone Encounter (Signed)
-----   Message from Sueanne Margarita, MD sent at 12/03/2017 10:12 PM EDT ----- Please let patient know that they have sleep apnea and recommend CPAP titration. Please set up titration in the sleep lab.

## 2017-12-12 ENCOUNTER — Encounter (HOSPITAL_COMMUNITY): Payer: Self-pay | Admitting: Nurse Practitioner

## 2017-12-12 ENCOUNTER — Encounter (HOSPITAL_COMMUNITY): Payer: Self-pay

## 2017-12-12 ENCOUNTER — Other Ambulatory Visit: Payer: Self-pay

## 2017-12-12 ENCOUNTER — Ambulatory Visit (HOSPITAL_BASED_OUTPATIENT_CLINIC_OR_DEPARTMENT_OTHER)
Admission: RE | Admit: 2017-12-12 | Discharge: 2017-12-12 | Disposition: A | Discharge status: Home / Self Care | Payer: Medicare Other | Source: Ambulatory Visit | Attending: Nurse Practitioner | Admitting: Nurse Practitioner

## 2017-12-12 ENCOUNTER — Inpatient Hospital Stay (HOSPITAL_COMMUNITY)
Admission: RE | Admit: 2017-12-12 | Discharge: 2017-12-15 | DRG: 310 | Disposition: A | Discharge status: Home / Self Care | Payer: Medicare Other | Source: Ambulatory Visit | Attending: Internal Medicine | Admitting: Internal Medicine

## 2017-12-12 VITALS — BP 132/74 | HR 90 | Ht 65.0 in | Wt 169.0 lb

## 2017-12-12 DIAGNOSIS — I481 Persistent atrial fibrillation: Secondary | ICD-10-CM

## 2017-12-12 DIAGNOSIS — Z7901 Long term (current) use of anticoagulants: Secondary | ICD-10-CM | POA: Diagnosis not present

## 2017-12-12 DIAGNOSIS — Z79899 Other long term (current) drug therapy: Secondary | ICD-10-CM

## 2017-12-12 DIAGNOSIS — I2510 Atherosclerotic heart disease of native coronary artery without angina pectoris: Secondary | ICD-10-CM | POA: Diagnosis present

## 2017-12-12 DIAGNOSIS — I428 Other cardiomyopathies: Secondary | ICD-10-CM | POA: Diagnosis present

## 2017-12-12 DIAGNOSIS — R001 Bradycardia, unspecified: Secondary | ICD-10-CM | POA: Diagnosis not present

## 2017-12-12 DIAGNOSIS — Z888 Allergy status to other drugs, medicaments and biological substances status: Secondary | ICD-10-CM | POA: Diagnosis not present

## 2017-12-12 DIAGNOSIS — E039 Hypothyroidism, unspecified: Secondary | ICD-10-CM | POA: Diagnosis present

## 2017-12-12 DIAGNOSIS — Z79890 Hormone replacement therapy (postmenopausal): Secondary | ICD-10-CM

## 2017-12-12 DIAGNOSIS — Z7983 Long term (current) use of bisphosphonates: Secondary | ICD-10-CM

## 2017-12-12 DIAGNOSIS — Z5181 Encounter for therapeutic drug level monitoring: Secondary | ICD-10-CM

## 2017-12-12 DIAGNOSIS — E785 Hyperlipidemia, unspecified: Secondary | ICD-10-CM | POA: Diagnosis present

## 2017-12-12 DIAGNOSIS — I4891 Unspecified atrial fibrillation: Secondary | ICD-10-CM | POA: Diagnosis not present

## 2017-12-12 DIAGNOSIS — E559 Vitamin D deficiency, unspecified: Secondary | ICD-10-CM | POA: Diagnosis present

## 2017-12-12 DIAGNOSIS — M8580 Other specified disorders of bone density and structure, unspecified site: Secondary | ICD-10-CM | POA: Diagnosis present

## 2017-12-12 DIAGNOSIS — I4819 Other persistent atrial fibrillation: Secondary | ICD-10-CM

## 2017-12-12 DIAGNOSIS — G4733 Obstructive sleep apnea (adult) (pediatric): Secondary | ICD-10-CM | POA: Diagnosis present

## 2017-12-12 DIAGNOSIS — Z91048 Other nonmedicinal substance allergy status: Secondary | ICD-10-CM

## 2017-12-12 DIAGNOSIS — D649 Anemia, unspecified: Secondary | ICD-10-CM | POA: Diagnosis not present

## 2017-12-12 LAB — BASIC METABOLIC PANEL
Anion gap: 8 (ref 5–15)
BUN: 12 mg/dL (ref 8–23)
CO2: 26 mmol/L (ref 22–32)
Calcium: 9 mg/dL (ref 8.9–10.3)
Chloride: 99 mmol/L (ref 98–111)
Creatinine, Ser: 0.82 mg/dL (ref 0.44–1.00)
GFR calc Af Amer: >60 mL/min (ref >60)
GFR calc non Af Amer: >60 mL/min (ref >60)
Glucose, Bld: 96 mg/dL (ref 70–99)
Potassium: 4.2 mmol/L (ref 3.5–5.1)
Sodium: 133 mmol/L — ABNORMAL LOW (ref 135–145)

## 2017-12-12 LAB — MAGNESIUM: Magnesium: 2 mg/dL (ref 1.7–2.4)

## 2017-12-12 MED ORDER — DOFETILIDE 500 MCG PO CAPS
500.0000 ug | ORAL_CAPSULE | Freq: Two times a day (BID) | ORAL | Status: DC
  Administered 2017-12-12 – 2017-12-15 (×6): 500 ug via ORAL
  Filled 2017-12-12 (×4): qty 1
  Filled 2017-12-12: qty 28
  Filled 2017-12-12 (×2): qty 1

## 2017-12-12 MED ORDER — PANTOPRAZOLE SODIUM 40 MG PO TBEC
40.0000 mg | DELAYED_RELEASE_TABLET | Freq: Every day | ORAL | Status: DC
  Administered 2017-12-12 – 2017-12-15 (×4): 40 mg via ORAL
  Filled 2017-12-12 (×5): qty 1

## 2017-12-12 MED ORDER — LEVOTHYROXINE SODIUM 88 MCG PO TABS
88.0000 ug | ORAL_TABLET | Freq: Every day | ORAL | Status: DC
  Administered 2017-12-13 – 2017-12-15 (×3): 88 ug via ORAL
  Filled 2017-12-12 (×3): qty 1

## 2017-12-12 MED ORDER — OFF THE BEAT BOOK
Freq: Once | Status: AC
  Administered 2017-12-12: 21:00:00
  Filled 2017-12-12: qty 1

## 2017-12-12 MED ORDER — OXYBUTYNIN CHLORIDE 5 MG PO TABS
2.5000 mg | ORAL_TABLET | Freq: Every evening | ORAL | Status: DC
  Administered 2017-12-12 – 2017-12-14 (×3): 2.5 mg via ORAL
  Filled 2017-12-12 (×3): qty 1

## 2017-12-12 MED ORDER — CALCIUM CARBONATE-VITAMIN D 600-200 MG-UNIT PO TABS: 1.0000 | ORAL_TABLET | Freq: Every day | ORAL | Status: DC

## 2017-12-12 MED ORDER — CALCIUM CARBONATE-VITAMIN D 500-200 MG-UNIT PO TABS
1.0000 | ORAL_TABLET | Freq: Every day | ORAL | Status: DC
  Administered 2017-12-12 – 2017-12-15 (×4): 1 via ORAL
  Filled 2017-12-12 (×4): qty 1

## 2017-12-12 MED ORDER — SODIUM CHLORIDE 0.9% FLUSH
3.0000 mL | Freq: Two times a day (BID) | INTRAVENOUS | Status: DC
  Administered 2017-12-12 – 2017-12-15 (×3): 3 mL via INTRAVENOUS

## 2017-12-12 MED ORDER — ROSUVASTATIN CALCIUM 10 MG PO TABS
5.0000 mg | ORAL_TABLET | Freq: Every evening | ORAL | Status: DC
  Administered 2017-12-12 – 2017-12-14 (×3): 5 mg via ORAL
  Filled 2017-12-12 (×3): qty 1

## 2017-12-12 MED ORDER — ADULT MULTIVITAMIN W/MINERALS CH
1.0000 | ORAL_TABLET | Freq: Every day | ORAL | Status: DC
  Administered 2017-12-12 – 2017-12-15 (×4): 1 via ORAL
  Filled 2017-12-12 (×4): qty 1

## 2017-12-12 MED ORDER — OCUVITE-LUTEIN PO CAPS: 1.0000 | ORAL_CAPSULE | Freq: Every day | ORAL | Status: DC

## 2017-12-12 MED ORDER — FERROUS SULFATE 325 (65 FE) MG PO TABS
325.0000 mg | ORAL_TABLET | ORAL | Status: DC
  Administered 2017-12-13 – 2017-12-15 (×2): 325 mg via ORAL
  Filled 2017-12-12 (×3): qty 1

## 2017-12-12 MED ORDER — SODIUM CHLORIDE 0.9% FLUSH: 3.0000 mL | INTRAVENOUS | Status: DC | PRN

## 2017-12-12 MED ORDER — CHOLECALCIFEROL 10 MCG (400 UNIT) PO TABS
400.0000 [IU] | ORAL_TABLET | Freq: Every day | ORAL | Status: DC
  Administered 2017-12-12 – 2017-12-15 (×4): 400 [IU] via ORAL
  Filled 2017-12-12 (×4): qty 1

## 2017-12-12 MED ORDER — SODIUM CHLORIDE 0.9 % IV SOLN
250.0000 mL | INTRAVENOUS | Status: DC | PRN
  Administered 2017-12-14: 12:00:00 via INTRAVENOUS

## 2017-12-12 MED ORDER — LOSARTAN POTASSIUM 25 MG PO TABS
25.0000 mg | ORAL_TABLET | Freq: Every day | ORAL | Status: DC
  Administered 2017-12-13 – 2017-12-15 (×3): 25 mg via ORAL
  Filled 2017-12-12 (×3): qty 1

## 2017-12-12 MED ORDER — ADULT ONE DAILY GUMMIES PO CHEW: 2.0000 | CHEWABLE_TABLET | Freq: Every day | ORAL | Status: DC

## 2017-12-12 MED ORDER — METOPROLOL TARTRATE 25 MG PO TABS
25.0000 mg | ORAL_TABLET | Freq: Two times a day (BID) | ORAL | Status: DC
  Administered 2017-12-12 – 2017-12-14 (×5): 25 mg via ORAL
  Filled 2017-12-12 (×6): qty 1

## 2017-12-12 MED ORDER — INFLUENZA VAC SPLIT HIGH-DOSE 0.5 ML IM SUSY
0.5000 mL | PREFILLED_SYRINGE | INTRAMUSCULAR | Status: DC
  Filled 2017-12-12: qty 0.5

## 2017-12-12 MED ORDER — LORATADINE 10 MG PO TABS
10.0000 mg | ORAL_TABLET | Freq: Every evening | ORAL | Status: DC
  Administered 2017-12-13 – 2017-12-14 (×2): 10 mg via ORAL
  Filled 2017-12-12 (×2): qty 1

## 2017-12-12 MED ORDER — APIXABAN 5 MG PO TABS
5.0000 mg | ORAL_TABLET | Freq: Two times a day (BID) | ORAL | Status: DC
  Administered 2017-12-12 – 2017-12-15 (×6): 5 mg via ORAL
  Filled 2017-12-12 (×6): qty 1

## 2017-12-12 NOTE — Care Management (Signed)
12-12-17   BENEFITS CHECK :  # 10.   Susan Bell   [facility]  @ [employer] PDP # [phone]  1. TIKOSYN    125  MCG , 250 MCG AND  500 MCG BID COVER-  NOT [place] # [phone]  2. DOFETILIED  125 MCG BID COVER- YES CO-PAY- $ 127.46 TIER- 4 DRUG PRIOR APPROVAL- NO  3. DOFETILIDE  250 MCG BID COVER- YES CO-PAY- $ 120.30 TIER- 4 DRUG PRIOR APPROVAL- NO  4 DOFETILIDE  500 MCG BID COVER-  YES CO-PAY- $ 111.52 TIER- 4 DRUG PRIOR APPROVAL- NO  DEDUCTIBLE : NOT MET  PREFERRED PHARMACY : YES    [facility] 90 DAY SUPPLY FOR [employer] M/O $ 90.00

## 2017-12-12 NOTE — Progress Notes (Signed)
Primary Care Physician: Shon Baton, MD Referring Physician: [facility] ER f/u   Susan Bell is a 77 y.o. female with a h/o HLD, hypothyroidism  that was successfully cardioverted in the [facility] ER 7/25 for new onset Afib. She returned to  the afib office 8/2 but had gone back into  afib and was unaware. She was started on BB for better rate control.   She denies tobacco, alcohol, excessive caffeine. Has been told she snores and appeared to stop breathing at night. Sleep study done 8/30 showed OSA, and is pending cpap titration trial.  She had an echo that showed EF at 35% with diffuse hypokinesis. Lexi myoview did not show any ischemia but was an intermediate test.  She is already on an ARB/BB.   She remains in afib, rate controlled. She denies any exertional chest pain, is fatigued and has noted shortness of breath more so with steps. She is here now to purse rhythm control with Tikosyn. She has weaned off lexapro and qtc today is acceptable 437 ms. No benadryl and no missed dose of anticoagulation. Is applying for pt assistance for tikosyn, she does qualify.  Today, she denies symptoms of palpitations, chest pain, shortness of breath, orthopnea, PND, lower extremity edema, dizziness, presyncope, syncope, or neurologic sequela. The patient is tolerating medications without difficulties and is otherwise without complaint today.   Past Medical History:  Diagnosis Date  . Allergy    seasonal  . Anemia   . Arthritis   . Cataract    bilateral removed  . Celiac disease   . Eczema   . H/O transfusion of platelets   . Hyperlipidemia   . Hypothyroidism   . Osteopenia   . Shingles   . Vitamin D deficiency    Past Surgical History:  Procedure Laterality Date  . CATARACT EXTRACTION     bilateral  . COLONOSCOPY    . DILATION AND CURETTAGE OF UTERUS    . UPPER GI ENDOSCOPY      Current Outpatient Medications  Medication Sig Dispense Refill  . alendronate (FOSAMAX) 70 MG tablet Take 70 mg  by mouth every Tuesday.     Marland Kitchen apixaban (ELIQUIS) 5 MG TABS tablet Take 1 tablet (5 mg total) by mouth 2 (two) times daily. 180 tablet 1  . Ascorbic Acid (VITAMIN C ADULT GUMMIES PO) Take 2 tablets by mouth daily at 12 noon.     . Calcium Carbonate-Vitamin D (CALCIUM 600+D PO) Take 1 tablet by mouth daily at 12 noon.    . cholecalciferol (VITAMIN D) 400 units TABS tablet Take 400 Units by mouth daily at 12 noon.    . Ferrous Sulfate (SLOW FE PO) Take 1 tablet by mouth every Monday, Wednesday, and Friday.    . levothyroxine (LEVOTHROID) 88 MCG tablet Take 88 mcg by mouth daily.      Marland Kitchen loratadine (CLARITIN) 10 MG tablet Take 10 mg by mouth every evening.     Marland Kitchen losartan (COZAAR) 25 MG tablet Take 25 mg daily by mouth.    . MELATONIN PO Take 2 mg by mouth daily as needed (sleep).    . metoprolol tartrate (LOPRESSOR) 25 MG tablet Take 1 tablet (25 mg total) by mouth 2 (two) times daily. 60 tablet 3  . Multiple Vitamins-Minerals (ADULT ONE DAILY GUMMIES) CHEW Chew 2 tablets by mouth daily.    . multivitamin-lutein (OCUVITE-LUTEIN) CAPS capsule Take 1 capsule by mouth daily with supper.     . mupirocin ointment (BACTROBAN) 2 %  Apply 1 application topically 2 (two) times daily. 30 g 2  . olopatadine (PATANOL) 0.1 % ophthalmic solution Place 1 drop into both eyes as needed for allergies.     Marland Kitchen omeprazole (PRILOSEC) 20 MG capsule Take 20 mg by mouth at bedtime.    Marland Kitchen oxybutynin (DITROPAN) 5 MG tablet Take 2.5 mg every evening by mouth.    . rosuvastatin (CRESTOR) 5 MG tablet Take 5 mg by mouth every evening.   3   No current facility-administered medications for this encounter.     Allergies  Allergen Reactions  . Betadine [Povidone Iodine]     itching    Social History   Socioeconomic History  . Marital status: Married    Spouse name: Not on file  . Number of children: 4  . Years of education: Not on file  . Highest education level: Not on file  Occupational History  . Occupation: retired      Fish farm manager: RETIRED  Social Needs  . Financial resource strain: Not on file  . Food insecurity:    Worry: Not on file    Inability: Not on file  . Transportation needs:    Medical: Not on file    Non-medical: Not on file  Tobacco Use  . Smoking status: Never Smoker  . Smokeless tobacco: Never Used  Substance and Sexual Activity  . Alcohol use: No    Alcohol/week: 0.0 standard drinks  . Drug use: No  . Sexual activity: Not on file  Lifestyle  . Physical activity:    Days per week: Not on file    Minutes per session: Not on file  . Stress: Not on file  Relationships  . Social connections:    Talks on phone: Not on file    Gets together: Not on file    Attends religious service: Not on file    Active member of club or organization: Not on file    Attends meetings of clubs or organizations: Not on file    Relationship status: Not on file  . Intimate partner violence:    Fear of current or ex partner: Not on file    Emotionally abused: Not on file    Physically abused: Not on file    Forced sexual activity: Not on file  Other Topics Concern  . Not on file  Social History Narrative   Daily caffeine    Family History  Problem Relation Age of Onset  . Liver cancer Mother   . Diabetes Father   . Heart disease Father   . Colon cancer Neg Hx     ROS- All systems are reviewed and negative except as per the HPI above  Physical Exam: Vitals:   12/12/17 0956  BP: 132/74  Pulse: 90  Weight: 76.7 kg  Height: 5\' 5"  (1.651 m)   Wt Readings from Last 3 Encounters:  12/12/17 76.7 kg  12/01/17 76.2 kg  11/27/17 75.8 kg    Labs: Lab Results  Component Value Date   NA 132 (L) 10/26/2017   K 3.8 10/26/2017   CL 100 10/26/2017   CO2 24 10/26/2017   GLUCOSE 131 (H) 10/26/2017   BUN 10 10/26/2017   CREATININE 0.80 10/26/2017   CALCIUM 8.9 10/26/2017   MG 2.0 10/26/2017   No results found for: INR No results found for: CHOL, HDL, LDLCALC, TRIG   GEN- The patient  is well appearing, alert and oriented x 3 today.   Head- normocephalic, atraumatic Eyes-  Sclera  clear, conjunctiva pink Ears- hearing intact Oropharynx- clear Neck- supple, no JVP Lymph- no cervical lymphadenopathy Lungs- Clear to ausculation bilaterally, normal work of breathing Heart- irregular rate and rhythm, no murmurs, rubs or gallops, PMI not laterally displaced GI- soft, NT, ND, + BS Extremities- no clubbing, cyanosis, or edema MS- no significant deformity or atrophy Skin- no rash or lesion Psych- euthymic mood, full affect Neuro- strength and sensation are intact  EKG-afib at 90 bpm, qrs int 116 ms, qtc 437 ms   Epic records reviewed Echo-Study Conclusions  - Left ventricle: The cavity size was normal. Wall thickness was   increased in a pattern of mild LVH. The estimated ejection   fraction was 35%. Diffuse hypokinesis. The study was not   technically sufficient to allow evaluation of LV diastolic   dysfunction due to atrial fibrillation. - Aortic valve: Trileaflet; moderately calcified leaflets.   Sclerosis without stenosis. - Mitral valve: Mildly calcified annulus. There was trivial   regurgitation. - Left atrium: The atrium was moderately dilated. - Right ventricle: The cavity size was normal. Systolic function   was mildly reduced. - Right atrium: The atrium was moderately dilated. - Tricuspid valve: Peak RV-RA gradient (S): 25 mm Hg. - Pulmonary arteries: PA peak pressure: 28 mm Hg (S). - Inferior vena cava: The vessel was normal in size. The   respirophasic diameter changes were in the normal range (>= 50%),   consistent with normal central venous pressure.  Impressions:  - The patient was in atrial fibrillation. Normal LV size with mild   LV hypertrophy. EF 35%, diffuse hypokinesis. Normal RV size with   mildly decreased systolic function. Moderate biatrial   enlargement.  Lexi myoview-Study Highlights     Nuclear stress EF: 32%.  The study is  normal.  This is an intermediate risk study.  The left ventricular ejection fraction is moderately decreased (30-44%).   No evidence for prior infarct or ischemia but severely decreased LVEF.   CT cardiac scoring 06/22/17-IMPRESSION: 1. LAD and circumflex coronary artery calcification.  2. Total Agatston Score: 772  3. MESA age and sex matched database percentile: 56 th  78.  Bilateral small effusions.  Assessment and Plan: 1. Paroxysmal afib Successful cardioversion in the ER 7/25 but with ERAF Echo showed EF 35%, stress test intermediate risk , but no ischemia by test Now here today for Tikosyn admission to restore SR Continue  metoprolol 25 mg bid with good rate control Sleep study did show OSA and ids pending titration trial No benadryl, has weaned off lexapro, qtc acceptable at 437 ms today Is applying for pt assistance for tikosyn Cbc/bmet  2. LV dysfunction at 35% Discussed with pt that reduced EF may be a consequence of the  afib Hopefully with restoration of SR, EF will improve Continue arb/bb Weight is stable  3. Chadsvasc score of  3 Continue eliquis 5 mg bid, states no missed doses   Bleeding precautions discussed  F/u in afib clinic one week after Tikosyn load F/u with Dr. Percival Spanish as scheduled to get established 10/1  Geroge Baseman. Akima Slaugh, [facility] [address] [place], [place] [address] [phone]

## 2017-12-12 NOTE — Progress Notes (Signed)
Pharmacy Review for Dofetilide (Tikosyn) Initiation  Admit Complaint: 77 y.o. female admitted 12/12/2017 with atrial fibrillation to be initiated on dofetilide.   Assessment:  Patient Exclusion Criteria: If any screening criteria checked as "Yes", then  patient  should NOT receive dofetilide until criteria item is corrected. If "Yes" please indicate correction plan.  YES  NO Patient  Exclusion Criteria Correction Plan  []  [x]  Baseline QTc interval is greater than or equal to 440 msec. IF above YES box checked dofetilide contraindicated unless patient has ICD; then may proceed if QTc 500-550 msec or with known ventricular conduction abnormalities may proceed with QTc 550-600 msec.  QTc = 437 msec   []  [x]  Magnesium level is less than 1.8 mEq/l : Last magnesium:  Lab Results  Component Value Date   MG 2.0 12/12/2017         []  [x]  Potassium level is less than 4 mEq/l : Last potassium:  Lab Results  Component Value Date   K 4.2 12/12/2017         []  [x]  Patient is known or suspected to have a digoxin level greater than 2 ng/ml: No results found for: DIGOXIN    []  [x]  Creatinine clearance less than 20 ml/min (calculated using Cockcroft-Gault, actual body weight and serum creatinine): Estimated Creatinine Clearance: 58.9 mL/min (by C-G formula based on SCr of 0.82 mg/dL).  CrCl 69 mL/min using total body weight  []  [x]  Patient has received drugs known to prolong the QT intervals within the last 48 hours (phenothiazines, tricyclics or tetracyclic antidepressants, erythromycin, cisapride, fluoroquinolones, azithromycin). Drugs not listed above may have an, as yet, undetected potential to prolong the QT interval, updated information on QT prolonging agents is available at this website:QT prolonging agents   []  [x]  Patient received a dose of hydrochlorothiazide (Oretic) alone or in any combination including triamterene (Dyazide, Maxzide) in the last 48 hours.   []  [x]  Patient received a  medication known to increase dofetilide plasma concentrations prior to initial dofetilide dose:  . Trimethoprim (Primsol, Proloprim) in the last 36 hours . Verapamil (Calan, Verelan) in the last 36 hours or a sustained release dose in the last 72 hours . Megestrol (Megace) in the last 5 days  . Cimetidine (Tagamet) in the last 6 hours . Ketoconazole (Nizoral) in the last 24 hours . Itraconazole (Sporanox) in the last 48 hours  . Prochlorperazine (Compazine) in the last 36 hours    []  [x]  Patient is known to have a history of torsades de pointes; congenital or acquired long QT syndromes.   []  [x]  Patient has received a Class 1 antiarrhythmic with less than 2 half-lives since last dose. (Disopyramide, Quinidine, Procainamide, Lidocaine, Mexiletine, Flecainide, Propafenone)   []  [x]  Patient has received amiodarone therapy in the past 3 months or amiodarone level is greater than 0.3 ng/ml.    Patient has been appropriately anticoagulated with apixaban.  Goal of Therapy: Follow renal function, electrolytes, potential drug interactions, and dose adjustment. Provide education and 1 week supply at discharge.  Plan:  [x]   Physician selected initial dose within range recommended for patients level of renal function - will monitor for response.  []   Physician selected initial dose outside of range recommended for patients level of renal function - will discuss if the dose should be altered at this time.   Select One Calculated CrCl  Dose q12h  [x]  > 60 ml/min 500 mcg  []  40-60 ml/min 250 mcg  []  20-40 ml/min 125 mcg   2.  Follow up QTc after the first 5 doses, renal function, electrolytes (K & Mg) daily x 3     days, dose adjustment, success of initiation and facilitate 1 week discharge supply as     clinically indicated.  3. Initiate Tikosyn education video (Call [phone] and ask for Tikosyn Video # [phone]).  4. Place Enrollment Form on the chart for discharge supply of dofetilide.   Doylene Canard, PharmD Clinical Pharmacist  Pager: [phone] Phone: [phone]  4:30 PM 12/12/2017

## 2017-12-12 NOTE — H&P (Addendum)
Cardiology Admission History and Physical:   Patient ID: Susan Bell MRN: [id number]; DOB: 02-12-1941   Admission date: 12/12/2017  Primary Care Provider: Shon Baton, MD Primary Cardiologist: Dr. Percival Spanish (pending initial visit) Primary Electrophysiologist:  New, this admission  Chief Complaint:  Tiksoyn initiation  Patient Profile:   Susan Bell is a 77 y.o. female with HLD, hypothyroidism, with new onset AFib   History of Present Illness:   [name] developed new AF that was successfully cardioverted in the [facility] ER 7/25 for new onset Afib. She was referred to  the afib clinic,  8/2 but had gone back into  afib and was unaware. She was started on BB for better rate control.   She denied tobacco, alcohol, excessive caffeine. Had been told she snores and appears to stop breathing at night. Sleep study done 8/30 showed OSA, and is pending cpap titration trial.  She had an echo that showed EF at 35% with diffuse hypokinesis. Lexi myoview did not show any ischemia but was an intermediate test given EF.  She was already on an ARB/BB.  She was planned rhythm control strategy with Tikosyn initiation, 1st weaned off her Lexapro.  Today at her AFib clinic visit, she had weaned off lexapro and qtc today is acceptable 437 ms. No benadryl and no missed dose of anticoagulation. Is applying for pt assistance for tikosyn, she does qualify.  She is pending her CPAP titration study, and is pending initial  consultation visit with Dr. Percival Spanish (pt request)  She has no palpitations, the AFib make her feel particularly fatigued, no energy, otherwise feels well today.  Past Medical History:  Diagnosis Date  . Allergy    seasonal  . Anemia   . Arthritis   . Cataract    bilateral removed  . Celiac disease   . Eczema   . H/O transfusion of platelets   . Hyperlipidemia   . Hypothyroidism   . Osteopenia   . Shingles   . Vitamin D deficiency     Past Surgical History:  Procedure  Laterality Date  . CATARACT EXTRACTION     bilateral  . COLONOSCOPY    . DILATION AND CURETTAGE OF UTERUS    . UPPER GI ENDOSCOPY       Medications Prior to Admission: Prior to Admission medications   Medication Sig Start Date End Date Taking? Authorizing Provider  alendronate (FOSAMAX) 70 MG tablet Take 70 mg by mouth every Tuesday.  11/24/14   [provider]  apixaban (ELIQUIS) 5 MG TABS tablet Take 1 tablet (5 mg total) by mouth 2 (two) times daily. 11/10/17   Sherran Needs, NP  Ascorbic Acid (VITAMIN C ADULT GUMMIES PO) Take 2 tablets by mouth daily at 12 noon.     [provider]  Calcium Carbonate-Vitamin D (CALCIUM 600+D PO) Take 1 tablet by mouth daily at 12 noon.    [provider]  cholecalciferol (VITAMIN D) 400 units TABS tablet Take 400 Units by mouth daily at 12 noon.    [provider]  Ferrous Sulfate (SLOW FE PO) Take 1 tablet by mouth every Monday, Wednesday, and Friday.    [provider]  levothyroxine (LEVOTHROID) 88 MCG tablet Take 88 mcg by mouth daily.      [provider]  loratadine (CLARITIN) 10 MG tablet Take 10 mg by mouth every evening.     [provider]  losartan (COZAAR) 25 MG tablet Take 25 mg daily by mouth. 12/19/16  [provider]  MELATONIN PO Take 2 mg by mouth daily as needed (sleep).    [provider]  metoprolol tartrate (LOPRESSOR) 25 MG tablet Take 1 tablet (25 mg total) by mouth 2 (two) times daily. 11/03/17 02/01/18  Sherran Needs, NP  Multiple Vitamins-Minerals (ADULT ONE DAILY GUMMIES) CHEW Chew 2 tablets by mouth daily.    [provider]  multivitamin-lutein (OCUVITE-LUTEIN) CAPS capsule Take 1 capsule by mouth daily with supper.     [provider]  mupirocin ointment (BACTROBAN) 2 % Apply 1 application topically 2 (two) times daily. 04/18/17   Trula Slade, DPM  olopatadine (PATANOL) 0.1 % ophthalmic solution Place 1 drop into both  eyes as needed for allergies.     [provider]  omeprazole (PRILOSEC) 20 MG capsule Take 20 mg by mouth at bedtime.    [provider]  oxybutynin (DITROPAN) 5 MG tablet Take 2.5 mg every evening by mouth. 02/12/17   [provider]  rosuvastatin (CRESTOR) 5 MG tablet Take 5 mg by mouth every evening.  09/21/17   [provider]     Allergies:    Allergies  Allergen Reactions  . Betadine [Povidone Iodine]     itching    Social History:   Social History   Socioeconomic History  . Marital status: Married    Spouse name: Not on file  . Number of children: 4  . Years of education: Not on file  . Highest education level: Not on file  Occupational History  . Occupation: retired    Fish farm manager: RETIRED  Social Needs  . Financial resource strain: Not on file  . Food insecurity:    Worry: Not on file    Inability: Not on file  . Transportation needs:    Medical: Not on file    Non-medical: Not on file  Tobacco Use  . Smoking status: Never Smoker  . Smokeless tobacco: Never Used  Substance and Sexual Activity  . Alcohol use: No    Alcohol/week: 0.0 standard drinks  . Drug use: No  . Sexual activity: Not on file  Lifestyle  . Physical activity:    Days per week: Not on file    Minutes per session: Not on file  . Stress: Not on file  Relationships  . Social connections:    Talks on phone: Not on file    Gets together: Not on file    Attends religious service: Not on file    Active member of club or organization: Not on file    Attends meetings of clubs or organizations: Not on file    Relationship status: Not on file  . Intimate partner violence:    Fear of current or ex partner: Not on file    Emotionally abused: Not on file    Physically abused: Not on file    Forced sexual activity: Not on file  Other Topics Concern  . Not on file  Social History Narrative   Daily caffeine    Family History:   The patient's family history  includes Diabetes in her father; Heart disease in her father; Liver cancer in her mother. There is no history of Colon cancer.    ROS:  Please see the history of present illness.  All other ROS reviewed and negative.     Physical Exam/Data:   Vitals:   12/12/17 1416  BP: 135/86  Pulse: 74  Temp: 98.4 F (36.9 C)  TempSrc: Oral  SpO2:  97%   No intake or output data in the 24 hours ending 12/12/17 1751 There were no vitals filed for this visit. There is no height or weight on file to calculate BMI.  General:  Well nourished, well developed, in no acute distress HEENT: normal Lymph: no adenopathy Neck: no JVD Endocrine:  No thryomegaly Vascular: No carotid bruits  Cardiac:  iRRR; no murmurs, gallops or rubs Lungs:  CTA b/l, no wheezing, rhonchi or rales  Abd: soft, nontender Ext: no edema Musculoskeletal:  No deformities Skin: warm and dry  Neuro:  no gross focal abnormalities noted Psych:  Normal affect    EKG:  The ECG that was done today was personally reviewed and demonstrates  AFib 90bpm, QTC 453m  Relevant CV Studies:  11/27/17; lexiscan stress test  Nuclear stress EF: 32%.  The study is normal.  This is an intermediate risk study.  The left ventricular ejection fraction is moderately decreased (30-44%).   No evidence for prior infarct or ischemia but severely decreased LVEF.  11/13/17; TTE Study Conclusions - Left ventricle: The cavity size was normal. Wall thickness was   increased in a pattern of mild LVH. The estimated ejection   fraction was 35%. Diffuse hypokinesis. The study was not   technically sufficient to allow evaluation of LV diastolic   dysfunction due to atrial fibrillation. - Aortic valve: Trileaflet; moderately calcified leaflets.   Sclerosis without stenosis. - Mitral valve: Mildly calcified annulus. There was trivial   regurgitation. - Left atrium: The atrium was moderately dilated. - Right ventricle: The cavity size was normal.  Systolic function   was mildly reduced. - Right atrium: The atrium was moderately dilated. - Tricuspid valve: Peak RV-RA gradient (S): 25 mm Hg. - Pulmonary arteries: PA peak pressure: 28 mm Hg (S). - Inferior vena cava: The vessel was normal in size. The   respirophasic diameter changes were in the normal range (>= 50%),   consistent with normal central venous pressure. Impressions: - The patient was in atrial fibrillation. Normal LV size with mild   LV hypertrophy. EF 35%, diffuse hypokinesis. Normal RV size with   mildly decreased systolic function. Moderate biatrial   enlargement.  Laboratory Data:  Chemistry Recent Labs  Lab 12/12/17 1012  NA 133*  K 4.2  CL 99  CO2 26  GLUCOSE 96  BUN 12  CREATININE 0.82  CALCIUM 9.0  GFRNONAA >60  GFRAA >60  ANIONGAP 8    No results for input(s): PROT, ALBUMIN, AST, ALT, ALKPHOS, BILITOT in the last 168 hours. HematologyNo results for input(s): WBC, RBC, HGB, HCT, MCV, MCH, MCHC, RDW, PLT in the last 168 hours. Cardiac EnzymesNo results for input(s): TROPONINI in the last 168 hours. No results for input(s): TROPIPOC in the last 168 hours.  BNPNo results for input(s): BNP, PROBNP in the last 168 hours.  DDimer No results for input(s): DDIMER in the last 168 hours.  Radiology/Studies:  No results found.  Assessment and Plan:   1. Persistent AFib, here for Tikosyn initation     CHA2DS2Vasc is 4, on Eliquis, appropriately dosed     K+ 4.2     Mag 2.0     Creat 0.82 (Calc CrCl is 69)     QTc is OK to start  Plan to start 5035m BID DCCV Thursday if not in SR  2. NICM     On BB/ARB     No symptoms or exam findings to suggest fluid OL     Continue  home meds   For questions or updates, please contact [place] Please consult [email or web address] for contact info under      Signed, Cristopher Peru, MD  12/12/2017 5:51 PM

## 2017-12-13 LAB — BASIC METABOLIC PANEL
Anion gap: 6 (ref 5–15)
BUN: 13 mg/dL (ref 8–23)
CO2: 27 mmol/L (ref 22–32)
Calcium: 9.1 mg/dL (ref 8.9–10.3)
Chloride: 103 mmol/L (ref 98–111)
Creatinine, Ser: 1.05 mg/dL — ABNORMAL HIGH (ref 0.44–1.00)
GFR calc Af Amer: 58 mL/min — ABNORMAL LOW (ref >60)
GFR calc non Af Amer: 50 mL/min — ABNORMAL LOW (ref >60)
Glucose, Bld: 98 mg/dL (ref 70–99)
Potassium: 4.1 mmol/L (ref 3.5–5.1)
Sodium: 136 mmol/L (ref 135–145)

## 2017-12-13 LAB — MAGNESIUM: Magnesium: 2.1 mg/dL (ref 1.7–2.4)

## 2017-12-13 MED ORDER — SODIUM CHLORIDE 0.9 % IV SOLN: 250.0000 mL | INTRAVENOUS | Status: DC

## 2017-12-13 MED ORDER — SODIUM CHLORIDE 0.9% FLUSH
3.0000 mL | Freq: Two times a day (BID) | INTRAVENOUS | Status: DC
  Administered 2017-12-13 – 2017-12-14 (×2): 3 mL via INTRAVENOUS

## 2017-12-13 MED ORDER — SODIUM CHLORIDE 0.9% FLUSH: 3.0000 mL | INTRAVENOUS | Status: DC | PRN

## 2017-12-13 MED ORDER — HYDROCORTISONE 1 % EX CREA
1.0000 "application " | TOPICAL_CREAM | Freq: Three times a day (TID) | CUTANEOUS | Status: DC | PRN
  Filled 2017-12-13: qty 28

## 2017-12-13 MED ORDER — SODIUM CHLORIDE 0.9 % IV BOLUS
250.0000 mL | Freq: Once | INTRAVENOUS | Status: AC
  Administered 2017-12-13: 250 mL via INTRAVENOUS

## 2017-12-13 MED ORDER — ACETAMINOPHEN 325 MG PO TABS
650.0000 mg | ORAL_TABLET | Freq: Four times a day (QID) | ORAL | Status: DC | PRN
  Administered 2017-12-13: 650 mg via ORAL
  Filled 2017-12-13 (×2): qty 2

## 2017-12-13 NOTE — Care Management Note (Signed)
Case Management Note  Patient Details  Name: Susan Bell MRN: [id number] Date of Birth: May 08, 1940  Subjective/Objective: Pt presented for Tikosyn Load- Persistent Atrial Fib. Benefits check completed and patient is aware of cost. Patient uses [facility] and medication can be ordered.                    Action/Plan: CM will assist with Rx for 7 day supply no refills. Patient will need additional Rx for 30 day supply with refills. No further needs from CM at this time.    Expected Discharge Date:                  Expected Discharge Plan:  Home/Self Care  In-House Referral:  NA  Discharge planning Services  CM Consult, Medication Assistance  Post Acute Care Choice:  NA Choice offered to:  NA  DME Arranged:  N/A DME Agency:  NA  HH Arranged:  NA HH Agency:  NA  Status of Service:  Completed, signed off  If discussed at [place] of Stay Meetings, dates discussed:    Additional Comments:  Bethena Roys, RN 12/13/2017, 4:07 PM

## 2017-12-13 NOTE — Progress Notes (Addendum)
Progress Note  Patient Name: Susan Bell Date of Encounter: 12/13/2017  Primary Cardiologist: Dr. Percival Spanish (pending)  Subjective   No complaints, feeling well.  Inpatient Medications    Scheduled Meds: . apixaban  5 mg Oral BID  . calcium-vitamin D  1 tablet Oral Daily  . cholecalciferol  400 Units Oral Q1200  . dofetilide  500 mcg Oral BID  . ferrous sulfate  325 mg Oral QODAY  . Influenza vac split quadrivalent PF  0.5 mL Intramuscular Tomorrow-1000  . levothyroxine  88 mcg Oral QAC breakfast  . loratadine  10 mg Oral QPM  . losartan  25 mg Oral Daily  . metoprolol tartrate  25 mg Oral BID  . multivitamin with minerals  1 tablet Oral Daily  . oxybutynin  2.5 mg Oral QPM  . pantoprazole  40 mg Oral Daily  . rosuvastatin  5 mg Oral QPM  . sodium chloride flush  3 mL Intravenous Q12H   Continuous Infusions: . sodium chloride     PRN Meds: sodium chloride, sodium chloride flush   Vital Signs    Vitals:   12/12/17 2004 12/13/17 0438 12/13/17 0440 12/13/17 0842  BP: 135/68 104/86  136/83  Pulse: 87 97  94  Resp: 18 14    Temp: 98 F (36.7 C) 97.9 F (36.6 C)    TempSrc: Oral Oral    SpO2: 99% 96%    Weight:   75.8 kg   Height: 5' 5"  (1.651 m)       Intake/Output Summary (Last 24 hours) at 12/13/2017 0935 Last data filed at 12/12/2017 2059 Gross per 24 hour  Intake 420 ml  Output -  Net 420 ml   Filed Weights   12/13/17 0440  Weight: 75.8 kg    Telemetry    AFib w/CVR - Personally Reviewed  ECG    AFib83 bpm, QTc is OK - Personally Reviewed  Physical Exam   GEN: No acute distress.   Neck: No JVD Cardiac: iRRR, no murmurs, rubs, or gallops.  Respiratory: CTA b/l. GI: Soft, nontender, non-distended  MS: No edema; No deformity. Neuro:  Nonfocal  Psych: Normal affect   Labs    Chemistry Recent Labs  Lab 12/12/17 1012 12/13/17 0358  NA 133* 136  K 4.2 4.1  CL 99 103  CO2 26 27  GLUCOSE 96 98  BUN 12 13  CREATININE 0.82 1.05*   CALCIUM 9.0 9.1  GFRNONAA >60 50*  GFRAA >60 58*  ANIONGAP 8 6     HematologyNo results for input(s): WBC, RBC, HGB, HCT, MCV, MCH, MCHC, RDW, PLT in the last 168 hours.  Cardiac EnzymesNo results for input(s): TROPONINI in the last 168 hours. No results for input(s): TROPIPOC in the last 168 hours.   BNPNo results for input(s): BNP, PROBNP in the last 168 hours.   DDimer No results for input(s): DDIMER in the last 168 hours.   Radiology    No results found.  Cardiac Studies   11/27/17; lexiscan stress test  Nuclear stress EF: 32%.  The study is normal.  This is an intermediate risk study.  The left ventricular ejection fraction is moderately decreased (30-44%).  No evidence for prior infarct or ischemia but severely decreased LVEF.  11/13/17; TTE Study Conclusions - Left ventricle: The cavity size was normal. Wall thickness was increased in a pattern of mild LVH. The estimated ejection fraction was 35%. Diffuse hypokinesis. The study was not technically sufficient to allow evaluation of LV  diastolic dysfunction due to atrial fibrillation. - Aortic valve: Trileaflet; moderately calcified leaflets. Sclerosis without stenosis. - Mitral valve: Mildly calcified annulus. There was trivial regurgitation. - Left atrium: The atrium was moderately dilated. - Right ventricle: The cavity size was normal. Systolic function was mildly reduced. - Right atrium: The atrium was moderately dilated. - Tricuspid valve: Peak RV-RA gradient (S): 25 mm Hg. - Pulmonary arteries: PA peak pressure: 28 mm Hg (S). - Inferior vena cava: The vessel was normal in size. The respirophasic diameter changes were in the normal range (>= 50%), consistent with normal central venous pressure. Impressions: - The patient was in atrial fibrillation. Normal LV size with mild LV hypertrophy. EF 35%, diffuse hypokinesis. Normal RV size with mildly decreased systolic function.  Moderate biatrial enlargement.  Patient Profile     77 y.o. female with HLD, hypothyroidism, with new onset AFib, admitted for Tikosyn initiation  Assessment & Plan    1. Persistent AFib, here for Tikosyn initation     CHA2DS2Vasc is 4, on Eliquis, appropriately dosed     K+ 4.1     Mag 2.1     Creat 1.09, will encourage PO intake today, follow with unchanged dose today     QTc is stable  DCCV tomorrow if not in SR, patient is agreeable  2. NICM     On BB/ARB     No symptoms or exam findings to suggest fluid OL     Continue home meds    For questions or updates, please contact [place] Please consult [email or web address] for contact info under        Signed, Baldwin Jamaica, PA-C  12/13/2017, 9:35 AM    EP Attending  Patient seen and examined. Agree with above. She is maintaining atrial fib. We will plan to proceed with DCCV tomorrow. Continue dofetilide.  Mikle Bosworth.D.

## 2017-12-13 NOTE — Plan of Care (Signed)
  Problem: Clinical Measurements: Goal: Cardiovascular complication will be avoided Outcome: Progressing   

## 2017-12-13 NOTE — Discharge Instructions (Addendum)
You have an appointment set up with the [facility].  Multiple studies have shown that being followed by a dedicated atrial fibrillation clinic in addition to the standard care you receive from your other physicians improves health. We believe that enrollment in the atrial fibrillation clinic will allow [place] to better care for you.   The phone number to the [facility] is [phone]. The clinic is staffed Monday through Friday from 8:30am to 5pm.  Parking Directions: The clinic is located in the Heart and Vascular Building connected to [facility]. 1)From [address] turn on to [address] and go to the 3rd entrance  (Heart and Vascular entrance) on the right. 2)Look to the right for Heart &Vascular Parking Garage. 3)A code for the entrance is required please call the clinic to receive this.   4)Take the elevators to the 1st floor. Registration is in the room with the glass walls at the end of the hallway.  If you have any trouble parking or locating the clinic, please dont hesitate to call [phone].  Information on my medicine - ELIQUIS (apixaban)  This medication education was reviewed with me or my healthcare representative as part of my discharge preparation.  The pharmacist that spoke with me during my hospital stay was:  Saundra Shelling, [facility]  Why was Eliquis prescribed for you? Eliquis was prescribed for you to reduce the risk of a blood clot forming that can cause a stroke if you have a medical condition called atrial fibrillation (a type of irregular heartbeat).  What do You need to know about Eliquis ? Take your Eliquis TWICE DAILY - one tablet in the morning and one tablet in the evening with or without food. If you have difficulty swallowing the tablet whole please discuss with your pharmacist how to take the medication safely.  Take Eliquis exactly as prescribed by your doctor and DO NOT stop taking Eliquis without talking to  the doctor who prescribed the medication.  Stopping may increase your risk of developing a stroke.  Refill your prescription before you run out.  After discharge, you should have regular check-up appointments with your healthcare provider that is prescribing your Eliquis.  In the future your dose may need to be changed if your kidney function or weight changes by a significant amount or as you get older.  What do you do if you miss a dose? If you miss a dose, take it as soon as you remember on the same day and resume taking twice daily.  Do not take more than one dose of ELIQUIS at the same time to make up a missed dose.  Important Safety Information A possible side effect of Eliquis is bleeding. You should call your healthcare provider right away if you experience any of the following: ? Bleeding from an injury or your nose that does not stop. ? Unusual colored urine (red or dark brown) or unusual colored stools (red or black). ? Unusual bruising for unknown reasons. ? A serious fall or if you hit your head (even if there is no bleeding).  Some medicines may interact with Eliquis and might increase your risk of bleeding or clotting while on Eliquis. To help avoid this, consult your healthcare provider or pharmacist prior to using any new prescription or non-prescription medications, including herbals, vitamins, non-steroidal anti-inflammatory drugs (NSAIDs) and supplements.  This website has more information on Eliquis (apixaban): http://www.eliquis.com/eliquis/home

## 2017-12-13 NOTE — Plan of Care (Signed)
  Problem: Education: Goal: Knowledge of General Education information will improve Description Including pain rating scale, medication(s)/side effects and non-pharmacologic comfort measures Outcome: Completed/Met   Problem: Clinical Measurements: Goal: Diagnostic test results will improve Outcome: Completed/Met   Problem: Activity: Goal: Risk for activity intolerance will decrease Outcome: Completed/Met

## 2017-12-14 ENCOUNTER — Telehealth: Payer: Self-pay | Admitting: *Deleted

## 2017-12-14 ENCOUNTER — Ambulatory Visit (HOSPITAL_COMMUNITY): Admit: 2017-12-14 | Payer: Medicare Other | Admitting: Cardiovascular Disease

## 2017-12-14 ENCOUNTER — Encounter (HOSPITAL_COMMUNITY)
Admission: RE | Disposition: A | Discharge status: Home / Self Care | Payer: Self-pay | Source: Ambulatory Visit | Attending: Internal Medicine

## 2017-12-14 ENCOUNTER — Inpatient Hospital Stay (HOSPITAL_COMMUNITY): Payer: Medicare Other | Admitting: Anesthesiology

## 2017-12-14 ENCOUNTER — Encounter: Payer: Self-pay | Admitting: *Deleted

## 2017-12-14 ENCOUNTER — Encounter (HOSPITAL_COMMUNITY): Payer: Self-pay | Admitting: *Deleted

## 2017-12-14 DIAGNOSIS — I4819 Other persistent atrial fibrillation: Secondary | ICD-10-CM

## 2017-12-14 HISTORY — PX: CARDIOVERSION: SHX1299

## 2017-12-14 LAB — BASIC METABOLIC PANEL
Anion gap: 6 (ref 5–15)
BUN: 10 mg/dL (ref 8–23)
CO2: 26 mmol/L (ref 22–32)
Calcium: 9 mg/dL (ref 8.9–10.3)
Chloride: 104 mmol/L (ref 98–111)
Creatinine, Ser: 0.82 mg/dL (ref 0.44–1.00)
GFR calc Af Amer: >60 mL/min (ref >60)
GFR calc non Af Amer: >60 mL/min (ref >60)
Glucose, Bld: 94 mg/dL (ref 70–99)
Potassium: 4.1 mmol/L (ref 3.5–5.1)
Sodium: 136 mmol/L (ref 135–145)

## 2017-12-14 LAB — MAGNESIUM: Magnesium: 2.2 mg/dL (ref 1.7–2.4)

## 2017-12-14 SURGERY — CARDIOVERSION
Anesthesia: General

## 2017-12-14 MED ORDER — PROPOFOL 10 MG/ML IV BOLUS
INTRAVENOUS | Status: DC | PRN
  Administered 2017-12-14: 50 mg via INTRAVENOUS

## 2017-12-14 MED ORDER — LIDOCAINE HCL (CARDIAC) PF 100 MG/5ML IV SOSY
PREFILLED_SYRINGE | INTRAVENOUS | Status: DC | PRN
  Administered 2017-12-14: 20 mg via INTRAVENOUS

## 2017-12-14 NOTE — Progress Notes (Signed)
QTC 483, Tikosyn administered.

## 2017-12-14 NOTE — Transfer of Care (Signed)
Immediate Anesthesia Transfer of Care Note  Patient: Susan Bell  Procedure(s) Performed: CARDIOVERSION (N/A )  Patient Location: Endoscopy Unit  Anesthesia Type:General  Level of Consciousness: awake, alert  and oriented  Airway & Oxygen Therapy: Patient Spontanous Breathing and Patient connected to nasal cannula oxygen  Post-op Assessment: Report given to RN and Post -op Vital signs reviewed and stable  Post vital signs: Reviewed and stable  Last Vitals:  Vitals Value Taken Time  BP    Temp    Pulse    Resp    SpO2      Last Pain:  Vitals:   12/14/17 1134  TempSrc: Oral  PainSc: 0-No pain      Patients Stated Pain Goal: 0 (37/95/58 3167)  Complications: No apparent anesthesia complications

## 2017-12-14 NOTE — Interval H&P Note (Signed)
History and Physical Interval Note:  12/14/2017 11:45 AM  Susan Bell  has presented today for surgery, with the diagnosis of a fib, tikosyn load  The various methods of treatment have been discussed with the patient and family. After consideration of risks, benefits and other options for treatment, the patient has consented to  Procedure(s): CARDIOVERSION (N/A) as a surgical intervention .  The patient's history has been reviewed, patient examined, no change in status, stable for surgery.  I have reviewed the patient's chart and labs.  Questions were answered to the patient's satisfaction.     Kimari Coudriet

## 2017-12-14 NOTE — Telephone Encounter (Signed)
-----   Message from Lauralee Evener, [place] sent at 12/08/2017  2:12 PM EDT ----- Regarding: RE: cpap titration No PA is required. Patient has medicare and a BCBS supplement. ----- Message ----- From: [name], CMA Sent: 12/08/2017  12:01 PM EDT To: [facility] Sleep Studies Subject: cpap titration                                 cpap titration

## 2017-12-14 NOTE — Progress Notes (Addendum)
Progress Note  Patient Name: Susan Bell Date of Encounter: 12/14/2017  Primary Cardiologist: Dr. Percival Spanish (pending)  Subjective   No complaints, feeling well.  No CP or SOB  Inpatient Medications    Scheduled Meds: . apixaban  5 mg Oral BID  . calcium-vitamin D  1 tablet Oral Daily  . cholecalciferol  400 Units Oral Q1200  . dofetilide  500 mcg Oral BID  . ferrous sulfate  325 mg Oral QODAY  . Influenza vac split quadrivalent PF  0.5 mL Intramuscular Tomorrow-1000  . levothyroxine  88 mcg Oral QAC breakfast  . loratadine  10 mg Oral QPM  . losartan  25 mg Oral Daily  . metoprolol tartrate  25 mg Oral BID  . multivitamin with minerals  1 tablet Oral Daily  . oxybutynin  2.5 mg Oral QPM  . pantoprazole  40 mg Oral Daily  . rosuvastatin  5 mg Oral QPM  . sodium chloride flush  3 mL Intravenous Q12H  . sodium chloride flush  3 mL Intravenous Q12H   Continuous Infusions: . sodium chloride    . sodium chloride     PRN Meds: sodium chloride, acetaminophen, hydrocortisone cream, sodium chloride flush, sodium chloride flush   Vital Signs    Vitals:   12/13/17 1420 12/13/17 2036 12/14/17 0500 12/14/17 0801  BP: 129/66 136/72 132/87 134/87  Pulse: 86 83 85 87  Resp:      Temp: 98.2 F (36.8 C) 98.5 F (36.9 C) (!) 97.5 F (36.4 C)   TempSrc: Oral Oral Oral   SpO2: 100% 99% 96%   Weight:   75.2 kg   Height:        Intake/Output Summary (Last 24 hours) at 12/14/2017 0818 Last data filed at 12/13/2017 2300 Gross per 24 hour  Intake 1080 ml  Output -  Net 1080 ml   Filed Weights   12/13/17 0440 12/14/17 0500  Weight: 75.8 kg 75.2 kg    Telemetry    AFib w/CVR - Personally Reviewed  ECG    AFib 81 bpm, QTcmeasured 360-436m, QTC 418-4645m Personally Reviewed  Physical Exam   GEN: No acute distress.   Neck: No JVD Cardiac: iRRR, no murmurs, rubs, or gallops.  Respiratory: CTA b/l. GI: Soft, nontender, non-distended  MS: No edema; No  deformity. Neuro:  Nonfocal  Psych: Normal affect   Labs    Chemistry Recent Labs  Lab 12/12/17 1012 12/13/17 0358 12/14/17 0500  NA 133* 136 136  K 4.2 4.1 4.1  CL 99 103 104  CO2 26 27 26   GLUCOSE 96 98 94  BUN 12 13 10   CREATININE 0.82 1.05* 0.82  CALCIUM 9.0 9.1 9.0  GFRNONAA >60 50* >60  GFRAA >60 58* >60  ANIONGAP 8 6 6      HematologyNo results for input(s): WBC, RBC, HGB, HCT, MCV, MCH, MCHC, RDW, PLT in the last 168 hours.  Cardiac EnzymesNo results for input(s): TROPONINI in the last 168 hours. No results for input(s): TROPIPOC in the last 168 hours.   BNPNo results for input(s): BNP, PROBNP in the last 168 hours.   DDimer No results for input(s): DDIMER in the last 168 hours.   Radiology    No results found.  Cardiac Studies   11/27/17; lexiscan stress test  Nuclear stress EF: 32%.  The study is normal.  This is an intermediate risk study.  The left ventricular ejection fraction is moderately decreased (30-44%).  No evidence for prior infarct or ischemia  but severely decreased LVEF.  11/13/17; TTE Study Conclusions - Left ventricle: The cavity size was normal. Wall thickness was increased in a pattern of mild LVH. The estimated ejection fraction was 35%. Diffuse hypokinesis. The study was not technically sufficient to allow evaluation of LV diastolic dysfunction due to atrial fibrillation. - Aortic valve: Trileaflet; moderately calcified leaflets. Sclerosis without stenosis. - Mitral valve: Mildly calcified annulus. There was trivial regurgitation. - Left atrium: The atrium was moderately dilated. - Right ventricle: The cavity size was normal. Systolic function was mildly reduced. - Right atrium: The atrium was moderately dilated. - Tricuspid valve: Peak RV-RA gradient (S): 25 mm Hg. - Pulmonary arteries: PA peak pressure: 28 mm Hg (S). - Inferior vena cava: The vessel was normal in size. The respirophasic diameter changes  were in the normal range (>= 50%), consistent with normal central venous pressure. Impressions: - The patient was in atrial fibrillation. Normal LV size with mild LV hypertrophy. EF 35%, diffuse hypokinesis. Normal RV size with mildly decreased systolic function. Moderate biatrial enlargement.  Patient Profile     77 y.o. female with HLD, hypothyroidism, with new onset AFib, admitted for Tikosyn initiation  Assessment & Plan    1. Persistent AFib, here for Tikosyn initation     CHA2DS2Vasc is 4, on Eliquis, appropriately dosed     K+ 4.1     Mag 2.2     Creat 0.82     QTc is OK, EKG reviewed with Dr. Lovena Le  DCCV today, patient is agreeable  2. NICM     On BB/ARB     No symptoms or exam findings to suggest fluid OL     Continue home meds    Anticipate discharge tomorrow    For questions or updates, please contact [place] Please consult [email or web address] for contact info under        Signed, Baldwin Jamaica, PA-C  12/14/2017, 8:18 AM    EP Attending  Patient seen and examined. Agree with above. The patient remains in atrial fib. She will undergo DCCV.   Ponciano Ort. EP Attending

## 2017-12-14 NOTE — Telephone Encounter (Signed)
Patient is scheduled for CPAP Titration on 01/12/17. Patient understands her titration study will be done at [facility] sleep lab. Patient understands she will receive a letter in a week or so detailing appointment, date, time, and location. Patient understands to call if she does not receive the letter  in a timely manner. Patient agrees with treatment and thanked me for call.

## 2017-12-14 NOTE — Discharge Summary (Addendum)
ELECTROPHYSIOLOGY PROCEDURE DISCHARGE SUMMARY    Patient ID: Susan Bell,  MRN: [id number], DOB/AGE: 08-Mar-1941 77 y.o.  Admit date: 12/12/2017 Discharge date: 12/15/17  Primary Care Physician: Shon Baton, MD  Primary Cardiologist: Dr. Percival Spanish (pendinginitial consultation) Electrophysiologist: new, Dr. Lovena Le  Primary Discharge Diagnosis:  1.  persistent atrial fibrillation status post Tikosyn loading this admission      CHA2DS2Vasc is 4, on Eliquis  Secondary Discharge Diagnosis:  1. NICM  Allergies  Allergen Reactions  . Betadine [Povidone Iodine]     itching     Procedures This Admission:  1.  Tikosyn loading 2.  Direct current cardioversion on 12/14/17 by Dr Sallyanne Kuster which successfully restored SR.  There were no early apparent complications.   Brief HPI: Susan Bell is a 77 y.o. female with a past medical history as noted above.  She was referred to the AFib clinic for treatment options of atrial fibrillation.  Risks, benefits, and alternatives to Tikosyn were reviewed with the patient who wished to proceed.    Hospital Course:  The patient was admitted and Tikosyn was initiated.  Renal function and electrolytes were followed during the hospitalization.  Her QTc remained stable.  On 12/14/17 the patient underwent direct current cardioversion which restored sinus rhythm.  She was monitored until discharge on telemetry which demonstrated resting SB high 40's asymptomatic with ambulatory rates 60's-70.  Will stop her metoprolol.  On the day of discharge, she feels well, no CP or SOB, she was examined by Dr Lovena Le who considered the patient stable for discharge to home.  Follow-up has been arranged with the AFib clinic in 1 week and with Dr Lovena Le in 4 weeks.    Physical Exam: Vitals:   12/14/17 2146 12/15/17 0624 12/15/17 0831 12/15/17 1127  BP: (!) 128/55 (!) 134/56 (!) 142/56 (!) 143/85  Pulse: (!) 55 (!) 54 (!) 52 (!) 51  Resp:    18  Temp: (!) 97.4 F  (36.3 C) 97.7 F (36.5 C)  98.2 F (36.8 C)  TempSrc: Oral Oral  Oral  SpO2: 100% 99%  98%  Weight:  76.1 kg    Height:        GEN- The patient is well appearing, alert and oriented x 3 today.   HEENT: normocephalic, atraumatic; sclera clear, conjunctiva pink; hearing intact; oropharynx clear; neck supple, no JVP Lymph- no cervical lymphadenopathy Lungs- CTA b/l, normal work of breathing.  No wheezes, rales, rhonchi Heart- RRR, no murmurs, rubs or gallops, PMI not laterally displaced GI- soft, non-tender, non-distended Extremities- no clubbing, cyanosis, or edema MS- no significant deformity or atrophy Skin- warm and dry, no rash or lesion Psych- euthymic mood, full affect Neuro- strength and sensation are intact   Labs:   Lab Results  Component Value Date   WBC 14.7 (H) 10/26/2017   HGB 10.5 (L) 10/26/2017   HCT 34.1 (L) 10/26/2017   MCV 86.3 10/26/2017   PLT 374 10/26/2017    Recent Labs  Lab 12/15/17 0439  NA 136  K 4.2  CL 105  CO2 26  BUN 14  CREATININE 0.88  CALCIUM 8.7*  GLUCOSE 97     Discharge Medications:  Allergies as of 12/15/2017      Reactions   Betadine [povidone Iodine]    itching      Medication List    STOP taking these medications   metoprolol tartrate 25 MG tablet Commonly known as:  LOPRESSOR     TAKE these medications  alendronate 70 MG tablet Commonly known as:  FOSAMAX Take 70 mg by mouth every Tuesday.   apixaban 5 MG Tabs tablet Commonly known as:  ELIQUIS Take 1 tablet (5 mg total) by mouth 2 (two) times daily.   CALCIUM 600+D PO Take 1 tablet by mouth daily at 12 noon.   cholecalciferol 400 units Tabs tablet Commonly known as:  VITAMIN D Take 400 Units by mouth daily at 12 noon.   dofetilide 500 MCG capsule Commonly known as:  TIKOSYN Take 1 capsule (500 mcg total) by mouth 2 (two) times daily.   LEVOTHROID 88 MCG tablet Generic drug:  levothyroxine Take 88 mcg by mouth daily.   loratadine 10 MG  tablet Commonly known as:  CLARITIN Take 10 mg by mouth every evening.   losartan 25 MG tablet Commonly known as:  COZAAR Take 25 mg daily by mouth.   MELATONIN PO Take 2 mg by mouth daily as needed (sleep).   multivitamin-lutein Caps capsule Take 1 capsule by mouth daily with supper.   ADULT ONE DAILY GUMMIES Chew Chew 2 tablets by mouth daily.   olopatadine 0.1 % ophthalmic solution Commonly known as:  PATANOL Place 1 drop into both eyes as needed for allergies.   omeprazole 20 MG capsule Commonly known as:  PRILOSEC Take 20 mg by mouth at bedtime.   oxybutynin 5 MG tablet Commonly known as:  DITROPAN Take 2.5 mg every evening by mouth.   rosuvastatin 5 MG tablet Commonly known as:  CRESTOR Take 5 mg by mouth every evening.   SLOW FE PO Take 1 tablet by mouth every Monday, Wednesday, and Friday.       Disposition:  Home  Discharge Instructions    Diet - low sodium heart healthy   Complete by:  As directed    Increase activity slowly   Complete by:  As directed      Follow-up Information    [facility] Follow up on 12/25/2017.   Specialty:  Cardiology Why:  2:00PM Contact information: [address] [id number] mc [place]       Evans Lance, MD Follow up on 01/12/2018.   Specialty:  Cardiology Why:  2:00PM Contact information: [address] [address] [phone]           Duration of Discharge Encounter: Greater than 30 minutes including physician time.  Venetia Night, PA-C 12/15/2017 12:21 PM  EP Attending  Agree with above. She is stable for DC. Usual followup as noted above.  Mikle Bosworth.D.

## 2017-12-14 NOTE — Op Note (Signed)
Procedure: Electrical Cardioversion Indications:  Atrial Fibrillation  Procedure Details:  Consent: Risks of procedure as well as the alternatives and risks of each were explained to the (patient/caregiver).  Consent for procedure obtained.  Time Out: Verified patient identification, verified procedure, site/side was marked, verified correct patient position, special equipment/implants available, medications/allergies/relevent history reviewed, required imaging and test results available.  Performed  Patient placed on cardiac monitor, pulse oximetry, supplemental oxygen as necessary.  Sedation given: IV propofol 50 mg, Dr. Ola Spurr Pacer pads placed anterior and posterior chest.  Cardioverted 1 time(s).  Cardioversion with synchronized biphasic 120J shock.  Evaluation: Findings: Post procedure EKG shows: sinus bradycardia 24-17 bpm Complications: None Patient did tolerate procedure well.  Time Spent Directly with the Patient:  30 minutes   Matraca Hunkins 12/14/2017, 11:52 AM

## 2017-12-14 NOTE — Anesthesia Postprocedure Evaluation (Signed)
Anesthesia Post Note  Patient: Aldene A Bekker  Procedure(s) Performed: CARDIOVERSION (N/A )     Patient location during evaluation: PACU Anesthesia Type: General Level of consciousness: awake and alert Pain management: pain level controlled Vital Signs Assessment: post-procedure vital signs reviewed and stable Respiratory status: spontaneous breathing, nonlabored ventilation, respiratory function stable and patient connected to nasal cannula oxygen Cardiovascular status: blood pressure returned to baseline and stable Postop Assessment: no apparent nausea or vomiting Anesthetic complications: no    Last Vitals:  Vitals:   12/14/17 1210 12/14/17 1245  BP: 140/68 (!) 152/61  Pulse: (!) 47 (!) 52  Resp: 16   Temp:  (!) 36.4 C  SpO2: 99%     Last Pain:  Vitals:   12/14/17 1245  TempSrc: Oral  PainSc:                  Tiajuana Amass

## 2017-12-14 NOTE — Anesthesia Preprocedure Evaluation (Addendum)
Anesthesia Evaluation  Patient identified by MRN, date of birth, ID band Patient awake    Reviewed: Allergy & Precautions, NPO status , Patient's Chart, lab work & pertinent test results, reviewed documented beta blocker date and time   History of Anesthesia Complications Negative for: history of anesthetic complications  Airway Mallampati: II  TM Distance: >3 FB Neck ROM: Full    Dental  (+) Dental Advisory Given   Pulmonary neg pulmonary ROS,    breath sounds clear to auscultation       Cardiovascular +CHF  + dysrhythmias Atrial Fibrillation  Rhythm:Irregular Rate:Normal   '19 Myoperfusion - Nuclear stress EF: 32%. The study is normal. This is an intermediate risk study. The left ventricular ejection fraction is moderately decreased (30-44%).  '19 TTE - Mild LVH. EF 35%. Diffuse hypokinesis. AV sclerosis without stenosis. Trivial MR. Moderately dilated LA. RV systolic function was mildly reduced. RA was moderately dilated. PA peak pressure: 28 mm Hg   Neuro/Psych negative neurological ROS  negative psych ROS   GI/Hepatic Neg liver ROS,  Celiac disease    Endo/Other  Hypothyroidism   Renal/GU negative Renal ROS  negative genitourinary   Musculoskeletal  (+) Arthritis ,   Abdominal   Peds  Hematology  (+) anemia ,   Anesthesia Other Findings   Reproductive/Obstetrics                            Anesthesia Physical Anesthesia Plan  ASA: III  Anesthesia Plan: General   Post-op Pain Management:    Induction: Intravenous  PONV Risk Score and Plan: 3 and Treatment may vary due to age or medical condition and Propofol infusion  Airway Management Planned: Mask and Natural Airway  Additional Equipment: None  Intra-op Plan:   Post-operative Plan:   Informed Consent: I have reviewed the patients History and Physical, chart, labs and discussed the procedure including the  risks, benefits and alternatives for the proposed anesthesia with the patient or authorized representative who has indicated his/her understanding and acceptance.     Plan Discussed with: CRNA and Anesthesiologist  Anesthesia Plan Comments:         Anesthesia Quick Evaluation

## 2017-12-14 NOTE — Anesthesia Procedure Notes (Signed)
Procedure Name: MAC Date/Time: 12/14/2017 11:52 AM Performed by: Teressa Lower., CRNA Pre-anesthesia Checklist: Patient identified, Emergency Drugs available, Suction available, Patient being monitored and Timeout performed Patient Re-evaluated:Patient Re-evaluated prior to induction Oxygen Delivery Method: Nasal cannula

## 2017-12-15 LAB — BASIC METABOLIC PANEL
Anion gap: 5 (ref 5–15)
BUN: 14 mg/dL (ref 8–23)
CO2: 26 mmol/L (ref 22–32)
Calcium: 8.7 mg/dL — ABNORMAL LOW (ref 8.9–10.3)
Chloride: 105 mmol/L (ref 98–111)
Creatinine, Ser: 0.88 mg/dL (ref 0.44–1.00)
GFR calc Af Amer: >60 mL/min (ref >60)
GFR calc non Af Amer: >60 mL/min (ref >60)
Glucose, Bld: 97 mg/dL (ref 70–99)
Potassium: 4.2 mmol/L (ref 3.5–5.1)
Sodium: 136 mmol/L (ref 135–145)

## 2017-12-15 LAB — MAGNESIUM: Magnesium: 2.1 mg/dL (ref 1.7–2.4)

## 2017-12-15 MED ORDER — DOFETILIDE 500 MCG PO CAPS: 500.0000 ug | ORAL_CAPSULE | Freq: Two times a day (BID) | ORAL | 6 refills | Status: DC

## 2017-12-15 NOTE — Progress Notes (Signed)
QTC 507 three hours after receiving Tikosyn.

## 2017-12-15 NOTE — Progress Notes (Signed)
EKG and telemetry reviewed with Dr. Lovena Le.  Remains in SR, QTc is borderline.  Will continue 538mcg this AM.   Follow post dose EKG for disposition.  Tommye Standard, PA-C

## 2017-12-15 NOTE — Care Management Important Message (Signed)
Important Message  Patient Details  Name: Susan Bell MRN: [id number] Date of Birth: September 11, 1940   Medicare Important Message Given:  Yes    Sherrica Niehaus P Terell Kincy 12/15/2017, 11:29 AM

## 2017-12-15 NOTE — Consult Note (Signed)
[facility] CM Primary Care Navigator  12/15/2017  Susan Bell [birth year] [id number]    Met with patientat the bedside to identify possible discharge needs.  Patient presented with persistent atrial fibrillation status post Tikosyn loading this admission (underwent cardioversion).    Patient endorsesDr.John Virgina Jock with [place] as herprimary care provider.   [facility] to obtain medicationswithout difficulty.   Patientstates managingherown medications at [employer] out of the containers and [employer] [employer] filled daily.  Patientreportsthatshe has been driving prior to admission but son Idalia Needle) and wife ([place]) will be able to provide transportation to her doctors' appointments if neededafter discharge, otherwise, she will be driving herself.  Patientlives alone and states that she has beenactive and physically independent with care prior to admission.She verbalized being the caregiver for herself, but children will take turns to assist and provide care if needed.  Anticipated plan for discharge ishome per patient.  Patientexpressedunderstanding to call primary care provider's office after dischargeto schedulea posthospitalfollow-up visitwithin 1- 2 weeksor sooner if needs arise.Patient letter(with PCP's contact number) wasgiven asherreminder.  Discussed with patientregarding [facility] CM services available for health management andresourcesat homebutshe indicated having no current needs or concernsat this time.Patient states that she basically takes care of herself and manages her health needs so far.  Patient had verbalizedunderstandingto seekreferral from primary care provider to [facility] care management ifdeemed necessary and appropriatefor anyservicesin thefuture.  [facility] care management information was  provided for futureneeds thatshemay have.  Patient however,verbally agreedand optedforEMMIcalls tofollow-up withrecovery at home.   Referral made for [facility] calls after discharge.    For additional questions please contact:  Edwena Felty A. Hailee Hollick, BSN, RN-BC [facility] PRIMARY CARE Navigator Cell: [phone]

## 2017-12-15 NOTE — Progress Notes (Signed)
HR dropped to 47 sustained.  Pt denies SOB, dizziness or lightheadedness.  Idolina Primer, RN

## 2017-12-20 ENCOUNTER — Encounter: Payer: Self-pay | Admitting: *Deleted

## 2017-12-22 ENCOUNTER — Ambulatory Visit (HOSPITAL_COMMUNITY): Payer: Medicare Other | Admitting: Nurse Practitioner

## 2017-12-25 ENCOUNTER — Ambulatory Visit (HOSPITAL_COMMUNITY)
Admit: 2017-12-25 | Discharge: 2017-12-25 | Disposition: A | Discharge status: Home / Self Care | Payer: Medicare Other | Source: Ambulatory Visit | Attending: Nurse Practitioner | Admitting: Nurse Practitioner

## 2017-12-25 ENCOUNTER — Encounter (HOSPITAL_COMMUNITY): Payer: Self-pay | Admitting: Nurse Practitioner

## 2017-12-25 VITALS — BP 152/82 | HR 71 | Ht 65.0 in | Wt 169.2 lb

## 2017-12-25 DIAGNOSIS — Z833 Family history of diabetes mellitus: Secondary | ICD-10-CM | POA: Diagnosis not present

## 2017-12-25 DIAGNOSIS — D649 Anemia, unspecified: Secondary | ICD-10-CM | POA: Diagnosis not present

## 2017-12-25 DIAGNOSIS — M8580 Other specified disorders of bone density and structure, unspecified site: Secondary | ICD-10-CM | POA: Diagnosis not present

## 2017-12-25 DIAGNOSIS — M1990 Unspecified osteoarthritis, unspecified site: Secondary | ICD-10-CM | POA: Insufficient documentation

## 2017-12-25 DIAGNOSIS — Z7983 Long term (current) use of bisphosphonates: Secondary | ICD-10-CM | POA: Insufficient documentation

## 2017-12-25 DIAGNOSIS — G4733 Obstructive sleep apnea (adult) (pediatric): Secondary | ICD-10-CM | POA: Insufficient documentation

## 2017-12-25 DIAGNOSIS — E039 Hypothyroidism, unspecified: Secondary | ICD-10-CM | POA: Diagnosis not present

## 2017-12-25 DIAGNOSIS — E559 Vitamin D deficiency, unspecified: Secondary | ICD-10-CM | POA: Diagnosis not present

## 2017-12-25 DIAGNOSIS — Z9841 Cataract extraction status, right eye: Secondary | ICD-10-CM | POA: Diagnosis not present

## 2017-12-25 DIAGNOSIS — I4819 Other persistent atrial fibrillation: Secondary | ICD-10-CM

## 2017-12-25 DIAGNOSIS — E785 Hyperlipidemia, unspecified: Secondary | ICD-10-CM | POA: Diagnosis not present

## 2017-12-25 DIAGNOSIS — I481 Persistent atrial fibrillation: Secondary | ICD-10-CM

## 2017-12-25 DIAGNOSIS — Z79890 Hormone replacement therapy (postmenopausal): Secondary | ICD-10-CM | POA: Diagnosis not present

## 2017-12-25 DIAGNOSIS — I5189 Other ill-defined heart diseases: Secondary | ICD-10-CM | POA: Diagnosis not present

## 2017-12-25 DIAGNOSIS — Z888 Allergy status to other drugs, medicaments and biological substances status: Secondary | ICD-10-CM | POA: Insufficient documentation

## 2017-12-25 DIAGNOSIS — K900 Celiac disease: Secondary | ICD-10-CM | POA: Diagnosis not present

## 2017-12-25 DIAGNOSIS — Z8249 Family history of ischemic heart disease and other diseases of the circulatory system: Secondary | ICD-10-CM | POA: Insufficient documentation

## 2017-12-25 DIAGNOSIS — Z9842 Cataract extraction status, left eye: Secondary | ICD-10-CM | POA: Insufficient documentation

## 2017-12-25 DIAGNOSIS — Z7901 Long term (current) use of anticoagulants: Secondary | ICD-10-CM | POA: Insufficient documentation

## 2017-12-25 DIAGNOSIS — I480 Paroxysmal atrial fibrillation: Secondary | ICD-10-CM | POA: Diagnosis not present

## 2017-12-25 DIAGNOSIS — Z800 Family history of malignant neoplasm of digestive organs: Secondary | ICD-10-CM | POA: Diagnosis not present

## 2017-12-25 DIAGNOSIS — I4891 Unspecified atrial fibrillation: Secondary | ICD-10-CM | POA: Diagnosis present

## 2017-12-25 DIAGNOSIS — Z79899 Other long term (current) drug therapy: Secondary | ICD-10-CM | POA: Insufficient documentation

## 2017-12-25 LAB — BASIC METABOLIC PANEL
Anion gap: 10 (ref 5–15)
BUN: 12 mg/dL (ref 8–23)
CO2: 23 mmol/L (ref 22–32)
Calcium: 8.9 mg/dL (ref 8.9–10.3)
Chloride: 99 mmol/L (ref 98–111)
Creatinine, Ser: 0.72 mg/dL (ref 0.44–1.00)
GFR calc Af Amer: >60 mL/min (ref >60)
GFR calc non Af Amer: >60 mL/min (ref >60)
Glucose, Bld: 102 mg/dL — ABNORMAL HIGH (ref 70–99)
Potassium: 3.9 mmol/L (ref 3.5–5.1)
Sodium: 132 mmol/L — ABNORMAL LOW (ref 135–145)

## 2017-12-25 LAB — MAGNESIUM: Magnesium: 2 mg/dL (ref 1.7–2.4)

## 2017-12-25 NOTE — Progress Notes (Signed)
Primary Care Physician: Shon Baton, MD Referring Physician: [facility] ER f/u Cardiologist- pending establishing with Dr. Percival Spanish 10/1 EP- Dr. Marlana Bell is a 77 y.o. female with a h/o HLD, hypothyroidism  that was successfully cardioverted in the [facility] ER 7/25 for new onset Afib. She returned to  the afib office 8/2 but had gone back into  afib and was unaware. She was started on BB for better rate control.   She denies tobacco, alcohol, excessive caffeine. Has been told she snores and appeared to stop breathing at night. Sleep study done 8/30 showed OSA, and is pending cpap titration trial.  She had an echo that showed EF at 35% with diffuse hypokinesis. Lexi myoview did not show any ischemia but was an intermediate test.  She is already on an ARB/BB.   She remains in afib, rate controlled. She denies any exertional chest pain, is fatigued and has noted shortness of breath more so with steps. She is here now to purse rhythm control with Tikosyn. She has weaned off lexapro and qtc today is acceptable 437 ms. No benadryl and no missed dose of anticoagulation. Is applying for pt assistance for tikosyn, she does qualify.  F/u afib clinic 9/23, pt remains in SR now on Tikosyn. She feels improved with much more energy, less dyspnea. She just enjoyed a beach trip with her daughters.   Today, she denies symptoms of palpitations, chest pain, shortness of breath, orthopnea, PND, lower extremity edema, dizziness, presyncope, syncope, or neurologic sequela. The patient is tolerating medications without difficulties and is otherwise without complaint today.   Past Medical History:  Diagnosis Date  . Allergy    seasonal  . Anemia   . Arthritis   . Cataract    bilateral removed  . Celiac disease   . Eczema   . H/O transfusion of platelets   . Hyperlipidemia   . Hypothyroidism   . Osteopenia   . Shingles   . Vitamin D deficiency    Past Surgical History:  Procedure Laterality Date    . CARDIOVERSION N/A 12/14/2017   Procedure: CARDIOVERSION;  Surgeon: Sanda Klein, MD;  Location: [facility];  Service: Cardiovascular;  Laterality: N/A;  . CATARACT EXTRACTION     bilateral  . COLONOSCOPY    . DILATION AND CURETTAGE OF UTERUS    . UPPER GI ENDOSCOPY      Current Outpatient Medications  Medication Sig Dispense Refill  . alendronate (FOSAMAX) 70 MG tablet Take 70 mg by mouth every Tuesday.     Marland Kitchen apixaban (ELIQUIS) 5 MG TABS tablet Take 1 tablet (5 mg total) by mouth 2 (two) times daily. 180 tablet 1  . Calcium Carbonate-Vitamin D (CALCIUM 600+D PO) Take 1 tablet by mouth daily at 12 noon.    . cholecalciferol (VITAMIN D) 400 units TABS tablet Take 400 Units by mouth daily at 12 noon.    . dofetilide (TIKOSYN) 500 MCG capsule Take 1 capsule (500 mcg total) by mouth 2 (two) times daily. 60 capsule 6  . Ferrous Sulfate (SLOW FE PO) Take 1 tablet by mouth every Monday, Wednesday, and Friday.    . levothyroxine (LEVOTHROID) 88 MCG tablet Take 88 mcg by mouth daily.      Marland Kitchen loratadine (CLARITIN) 10 MG tablet Take 10 mg by mouth every evening.     Marland Kitchen losartan (COZAAR) 25 MG tablet Take 25 mg daily by mouth.    . MELATONIN PO Take 2 mg by mouth daily  as needed (sleep).    . Multiple Vitamins-Minerals (ADULT ONE DAILY GUMMIES) CHEW Chew 2 tablets by mouth daily.    . multivitamin-lutein (OCUVITE-LUTEIN) CAPS capsule Take 1 capsule by mouth daily with supper.     Marland Kitchen olopatadine (PATANOL) 0.1 % ophthalmic solution Place 1 drop into both eyes as needed for allergies.     Marland Kitchen omeprazole (PRILOSEC) 20 MG capsule Take 20 mg by mouth at bedtime.    Marland Kitchen oxybutynin (DITROPAN) 5 MG tablet Take 2.5 mg every evening by mouth.    . rosuvastatin (CRESTOR) 5 MG tablet Take 5 mg by mouth every evening.   3   No current facility-administered medications for this encounter.     Allergies  Allergen Reactions  . Betadine [Povidone Iodine]     itching    Social History   Socioeconomic History   . Marital status: Married    Spouse name: Not on file  . Number of children: 4  . Years of education: Not on file  . Highest education level: Not on file  Occupational History  . Occupation: retired    Fish farm manager: RETIRED  Social Needs  . Financial resource strain: Not on file  . Food insecurity:    Worry: Not on file    Inability: Not on file  . Transportation needs:    Medical: Not on file    Non-medical: Not on file  Tobacco Use  . Smoking status: Never Smoker  . Smokeless tobacco: Never Used  Substance and Sexual Activity  . Alcohol use: No    Alcohol/week: 0.0 standard drinks  . Drug use: No  . Sexual activity: Not on file  Lifestyle  . Physical activity:    Days per week: Not on file    Minutes per session: Not on file  . Stress: Not on file  Relationships  . Social connections:    Talks on phone: Not on file    Gets together: Not on file    Attends religious service: Not on file    Active member of club or organization: Not on file    Attends meetings of clubs or organizations: Not on file    Relationship status: Not on file  . Intimate partner violence:    Fear of current or ex partner: Not on file    Emotionally abused: Not on file    Physically abused: Not on file    Forced sexual activity: Not on file  Other Topics Concern  . Not on file  Social History Narrative   Daily caffeine    Family History  Problem Relation Age of Onset  . Liver cancer Mother   . Diabetes Father   . Heart disease Father   . Colon cancer Neg Hx     ROS- All systems are reviewed and negative except as per the HPI above  Physical Exam: Vitals:   12/25/17 1400  BP: (!) 152/82  Pulse: 71  Weight: 76.7 kg  Height: 5\' 5"  (1.651 m)   Wt Readings from Last 3 Encounters:  12/25/17 76.7 kg  12/15/17 76.1 kg  12/12/17 76.7 kg    Labs: Lab Results  Component Value Date   NA 136 12/15/2017   K 4.2 12/15/2017   CL 105 12/15/2017   CO2 26 12/15/2017   GLUCOSE 97  12/15/2017   BUN 14 12/15/2017   CREATININE 0.88 12/15/2017   CALCIUM 8.7 (L) 12/15/2017   MG 2.1 12/15/2017   No results found for: INR No results found  for: CHOL, HDL, LDLCALC, TRIG   GEN- The patient is well appearing, alert and oriented x 3 today.   Head- normocephalic, atraumatic Eyes-  Sclera clear, conjunctiva pink Ears- hearing intact Oropharynx- clear Neck- supple, no JVP Lymph- no cervical lymphadenopathy Lungs- Clear to ausculation bilaterally, normal work of breathing Heart- regular rate and rhythm, no murmurs, rubs or gallops, PMI not laterally displaced GI- soft, NT, ND, + BS Extremities- no clubbing, cyanosis, or edema MS- no significant deformity or atrophy Skin- no rash or lesion Psych- euthymic mood, full affect Neuro- strength and sensation are intact  EKG-NSR at 71 bpm, pr int 174 ms, qtc 486 ms(stable) Epic records reviewed Echo-Study Conclusions  - Left ventricle: The cavity size was normal. Wall thickness was   increased in a pattern of mild LVH. The estimated ejection   fraction was 35%. Diffuse hypokinesis. The study was not   technically sufficient to allow evaluation of LV diastolic   dysfunction due to atrial fibrillation. - Aortic valve: Trileaflet; moderately calcified leaflets.   Sclerosis without stenosis. - Mitral valve: Mildly calcified annulus. There was trivial   regurgitation. - Left atrium: The atrium was moderately dilated. - Right ventricle: The cavity size was normal. Systolic function   was mildly reduced. - Right atrium: The atrium was moderately dilated. - Tricuspid valve: Peak RV-RA gradient (S): 25 mm Hg. - Pulmonary arteries: PA peak pressure: 28 mm Hg (S). - Inferior vena cava: The vessel was normal in size. The   respirophasic diameter changes were in the normal range (>= 50%),   consistent with normal central venous pressure.  Impressions:  - The patient was in atrial fibrillation. Normal LV size with mild   LV  hypertrophy. EF 35%, diffuse hypokinesis. Normal RV size with   mildly decreased systolic function. Moderate biatrial   enlargement.  Lexi myoview-Study Highlights     Nuclear stress EF: 32%.  The study is normal.  This is an intermediate risk study.  The left ventricular ejection fraction is moderately decreased (30-44%).   No evidence for prior infarct or ischemia but severely decreased LVEF.   CT cardiac scoring 06/22/17-IMPRESSION: 1. LAD and circumflex coronary artery calcification.  2. Total Agatston Score: 772  3. MESA age and sex matched database percentile: 29 th  45.  Bilateral small effusions.  Assessment and Plan: 1. Paroxysmal afib Successful cardioversion in the ER 7/25 but with ERAF Echo showed EF 35%, stress test intermediate risk , but no ischemia by test Now here for f/u  for Tikosyn f/u in SR Tikosyn precautions reviewed BB stopped in the hospital  Sleep study did show OSA and is pending titration trial No benadryl, has weaned off lexapro, qtc acceptable at 486 ms today Getting tikosyn thru pt assistance  bmet/mag  2. LV dysfunction at 35% Discussed with pt that reduced EF may be a consequence of the  afib Hopefully with restoration of SR, EF will improve Will need repeat echo in a few months Continue arb Weight is stable  3. Chadsvasc score of  3 Continue eliquis 5 mg bid, states no missed doses   Bleeding precautions discussed   F/u with Dr. Percival Spanish as scheduled to get established 10/1 Dr. Lovena Le 10/11  Butch Penny C. [place], [facility] [address] [place], [place] [address] [phone]

## 2017-12-31 NOTE — Progress Notes (Signed)
Cardiology Office Note   Date:  01/02/2018   ID:  WM FRUCHTER, DOB 26-Dec-1940, MRN [id number]  PCP:  Shon Baton, MD  Cardiologist:   No primary care provider on file.   Chief Complaint  Patient presents with  . Atrial Fibrillation      History of Present Illness: Susan Bell is a 77 y.o. female who presents for follow up of atrial fib.  She has had persistent atrial fib requiring cardioversion and is now on Tikosyn because of recurrence after cardioversion.  She was seen in the atrial fib clinic.   She saw Dr. Lovena Le and this is her first visit with me.     She is back in atrial fibrillation but she really does not notice this.  She noticed that yesterday her heart rate was okay when she took her blood pressure.  However, today the EKG demonstrates atrial fibrillation and her QT is prolonged from the previous.  She is not noticing any palpitations, presyncope or syncope.  Of note it was thought that she previously had some fatigue and dyspnea related to the fibrillation but she is really not noticing this and actually feels better today than she did yesterday.  She is not having any chest pressure, neck or arm discomfort.  Is had no weight gain or edema.   Past Medical History:  Diagnosis Date  . Allergy    seasonal  . Anemia   . Arthritis   . Cataract    bilateral removed  . Celiac disease   . Eczema   . H/O transfusion of platelets   . Hyperlipidemia   . Hypothyroidism   . Osteopenia   . Shingles   . Vitamin D deficiency     Past Surgical History:  Procedure Laterality Date  . CARDIOVERSION N/A 12/14/2017   Procedure: CARDIOVERSION;  Surgeon: Sanda Klein, MD;  Location: [facility];  Service: Cardiovascular;  Laterality: N/A;  . CATARACT EXTRACTION     bilateral  . COLONOSCOPY    . DILATION AND CURETTAGE OF UTERUS    . UPPER GI ENDOSCOPY       Current Outpatient Medications  Medication Sig Dispense Refill  . alendronate (FOSAMAX) 70 MG  tablet Take 70 mg by mouth every Tuesday.     Marland Kitchen apixaban (ELIQUIS) 5 MG TABS tablet Take 1 tablet (5 mg total) by mouth 2 (two) times daily. 180 tablet 1  . Calcium Carbonate-Vitamin D (CALCIUM 600+D PO) Take 1 tablet by mouth daily at 12 noon.    . cholecalciferol (VITAMIN D) 400 units TABS tablet Take 400 Units by mouth daily at 12 noon.    . Ferrous Sulfate (SLOW FE PO) Take 45 mg by mouth every Monday, Wednesday, and Friday.     . levothyroxine (LEVOTHROID) 88 MCG tablet Take 88 mcg by mouth daily.      Marland Kitchen loratadine (CLARITIN) 10 MG tablet Take 10 mg by mouth every evening.     Marland Kitchen losartan (COZAAR) 25 MG tablet Take 25 mg daily by mouth.    . MELATONIN PO Take 2 mg by mouth daily as needed (sleep).    . Multiple Vitamins-Minerals (ADULT ONE DAILY GUMMIES) CHEW Chew 2 tablets by mouth daily.    . multivitamin-lutein (OCUVITE-LUTEIN) CAPS capsule Take 1 capsule by mouth daily with supper.     Marland Kitchen olopatadine (PATANOL) 0.1 % ophthalmic solution Place 1 drop into both eyes as needed for allergies.     Marland Kitchen omeprazole (PRILOSEC) 20 MG capsule  Take 20 mg by mouth at bedtime.    Marland Kitchen oxybutynin (DITROPAN) 5 MG tablet Take 2.5 mg every evening by mouth.    . rosuvastatin (CRESTOR) 5 MG tablet Take 5 mg by mouth every evening.   3  . metoprolol succinate (TOPROL-XL) 50 MG 24 hr tablet Take 1 tablet (50 mg total) by mouth daily. Take with or immediately following a meal. 30 tablet 6   No current facility-administered medications for this visit.     Allergies:   Betadine [povidone iodine]    ROS:  Please see the history of present illness.   Otherwise, review of systems are positive for none.   All other systems are reviewed and negative.    PHYSICAL EXAM: VS:  BP 136/84 (BP Location: Left Arm, Patient Position: Sitting, Cuff Size: Normal)   Pulse (!) 122   Ht 5' 5"  (1.651 m)   Wt 168 lb 9.6 oz (76.5 kg)   BMI 28.06 kg/m  , BMI Body mass index is 28.06 kg/m. GENERAL:  Well appearing HEENT:  Pupils  equal round and reactive, fundi not visualized, oral mucosa unremarkable NECK:  No jugular venous distention, waveform within normal limits, carotid upstroke brisk and symmetric, no bruits, no thyromegaly LYMPHATICS:  No cervical, inguinal adenopathy LUNGS:  Clear to auscultation bilaterally BACK:  No CVA tenderness CHEST:  Unremarkable HEART:  PMI not displaced or sustained,S1 and S2 within normal limits, no S3, no clicks, no rubs, no murmurs, irregular ABD:  Flat, positive bowel sounds normal in frequency in pitch, no bruits, no rebound, no guarding, no midline pulsatile mass, no hepatomegaly, no splenomegaly EXT:  2 plus pulses throughout, no edema, no cyanosis no clubbing SKIN:  No rashes no nodules NEURO:  Cranial nerves II through XII grossly intact, motor grossly intact throughout PSYCH:  Cognitively intact, oriented to person place and time    EKG:  EKG is ordered today. The ekg ordered today demonstrates atrial fibrillation, rate 122, left axis deviation, borderline interventricular conduction delay, QTC prolonged.   Recent Labs: 10/26/2017: Hemoglobin 10.5; Platelets 374; TSH 0.468 12/25/2017: BUN 12; Creatinine, Ser 0.72; Magnesium 2.0; Potassium 3.9; Sodium 132    Lipid Panel No results found for: CHOL, TRIG, HDL, CHOLHDL, VLDL, LDLCALC, LDLDIRECT    Wt Readings from Last 3 Encounters:  01/02/18 168 lb 9.6 oz (76.5 kg)  12/25/17 169 lb 3.2 oz (76.7 kg)  12/15/17 167 lb 11.2 oz (76.1 kg)      Other studies Reviewed: Additional studies/ records that were reviewed today include: Hospital records, EP clinic records. Review of the above records demonstrates:  Please see elsewhere in the note.     ASSESSMENT AND PLAN:  ATRIAL FIB:   Susan Bell has a CHA2DS2 - VASc score of 3.   She is back in fibrillation.  Although she really does not feel this because of the reduced ejection fraction I think it might be important to try to maintain sinus rhythm in the  future.  Her QT is prolonged compared to previous though difficult to calculate.  Given this and the fact that she is back in fibrillation I like to discontinue this medication.  And get a start her on Toprol-XL 50 mg daily and she will be seen in the A Fib clinic in 2 days.  I did speak with them.  She will likely need up titration of her beta-blocker.  In the future I would consider starting amiodarone with cardioversion probably.  ELEVATED CORONARY CALCIUM:  She is not having any angina.  I will manage this conservatively for now.  SLEEP APNEA:  This was confirmed on sleep study.  She will need follow-up in our sleep clinic and this can be arranged after I see her back in follow-up.  CARDIOMYOPATHY:  This was thought to be possibly related to the arrhythmia.  Uptitrate with the beta-blocker.  She will continue on the ACE inhibitor.  I would follow-up her ejection fraction once we have clarified her rhythm status pending the plan above.   Current medicines are reviewed at length with the patient today.  The patient does not have concerns regarding medicines.  The following changes have been made:  As above  Labs/ tests ordered today include: None  Orders Placed This Encounter  Procedures  . EKG 12-Lead  . Rhythm ECG, report     Disposition:   FU with A Fib clinic    Signed, Minus Breeding, MD  01/02/2018 3:03 PM    [facility]

## 2018-01-02 ENCOUNTER — Encounter: Payer: Self-pay | Admitting: Cardiology

## 2018-01-02 ENCOUNTER — Ambulatory Visit (INDEPENDENT_AMBULATORY_CARE_PROVIDER_SITE_OTHER): Payer: Medicare Other | Admitting: Cardiology

## 2018-01-02 VITALS — BP 136/84 | HR 122 | Ht 65.0 in | Wt 168.6 lb

## 2018-01-02 DIAGNOSIS — G4730 Sleep apnea, unspecified: Secondary | ICD-10-CM | POA: Insufficient documentation

## 2018-01-02 DIAGNOSIS — I4819 Other persistent atrial fibrillation: Secondary | ICD-10-CM

## 2018-01-02 DIAGNOSIS — I429 Cardiomyopathy, unspecified: Secondary | ICD-10-CM | POA: Insufficient documentation

## 2018-01-02 MED ORDER — METOPROLOL SUCCINATE ER 50 MG PO TB24: 50.0000 mg | ORAL_TABLET | Freq: Every day | ORAL | 6 refills | Status: DC

## 2018-01-02 NOTE — Patient Instructions (Signed)
Medication Instructions:  STOP- Tikosyn START- Metoprolol Succinate 50 mg daily  If you need a refill on your cardiac medications before your next appointment, please call your pharmacy.  Labwork: None Ordered   Testing/Procedures: None Ordered  Special Instructions: Parking Garage code:[id number]  Follow-Up: Your physician wants you to follow-up in: Friday October 4th at 10:00 with [employer].       Thank you for choosing [facility] at [facility]!!

## 2018-01-05 ENCOUNTER — Ambulatory Visit (HOSPITAL_COMMUNITY)
Admission: RE | Admit: 2018-01-05 | Discharge: 2018-01-05 | Disposition: A | Discharge status: Home / Self Care | Payer: Medicare Other | Source: Ambulatory Visit | Attending: Nurse Practitioner | Admitting: Nurse Practitioner

## 2018-01-05 ENCOUNTER — Encounter (HOSPITAL_COMMUNITY): Payer: Self-pay | Admitting: Nurse Practitioner

## 2018-01-05 VITALS — BP 162/78 | HR 60 | Ht 65.0 in | Wt 167.0 lb

## 2018-01-05 DIAGNOSIS — Z7901 Long term (current) use of anticoagulants: Secondary | ICD-10-CM | POA: Diagnosis not present

## 2018-01-05 DIAGNOSIS — I4819 Other persistent atrial fibrillation: Secondary | ICD-10-CM | POA: Insufficient documentation

## 2018-01-05 DIAGNOSIS — I517 Cardiomegaly: Secondary | ICD-10-CM | POA: Insufficient documentation

## 2018-01-05 DIAGNOSIS — M8580 Other specified disorders of bone density and structure, unspecified site: Secondary | ICD-10-CM | POA: Insufficient documentation

## 2018-01-05 DIAGNOSIS — G4733 Obstructive sleep apnea (adult) (pediatric): Secondary | ICD-10-CM | POA: Insufficient documentation

## 2018-01-05 DIAGNOSIS — Z8249 Family history of ischemic heart disease and other diseases of the circulatory system: Secondary | ICD-10-CM | POA: Diagnosis not present

## 2018-01-05 DIAGNOSIS — I4891 Unspecified atrial fibrillation: Secondary | ICD-10-CM | POA: Diagnosis present

## 2018-01-05 DIAGNOSIS — G4730 Sleep apnea, unspecified: Secondary | ICD-10-CM | POA: Diagnosis not present

## 2018-01-05 DIAGNOSIS — E559 Vitamin D deficiency, unspecified: Secondary | ICD-10-CM | POA: Diagnosis not present

## 2018-01-05 DIAGNOSIS — R9431 Abnormal electrocardiogram [ECG] [EKG]: Secondary | ICD-10-CM | POA: Insufficient documentation

## 2018-01-05 DIAGNOSIS — Z79890 Hormone replacement therapy (postmenopausal): Secondary | ICD-10-CM | POA: Insufficient documentation

## 2018-01-05 DIAGNOSIS — K900 Celiac disease: Secondary | ICD-10-CM | POA: Insufficient documentation

## 2018-01-05 DIAGNOSIS — I4811 Longstanding persistent atrial fibrillation: Secondary | ICD-10-CM | POA: Diagnosis not present

## 2018-01-05 DIAGNOSIS — Z79899 Other long term (current) drug therapy: Secondary | ICD-10-CM | POA: Diagnosis not present

## 2018-01-05 DIAGNOSIS — E039 Hypothyroidism, unspecified: Secondary | ICD-10-CM | POA: Diagnosis not present

## 2018-01-05 DIAGNOSIS — Z833 Family history of diabetes mellitus: Secondary | ICD-10-CM | POA: Diagnosis not present

## 2018-01-05 DIAGNOSIS — E785 Hyperlipidemia, unspecified: Secondary | ICD-10-CM | POA: Diagnosis not present

## 2018-01-05 HISTORY — DX: Paroxysmal atrial fibrillation: I48.0

## 2018-01-05 HISTORY — DX: Obstructive sleep apnea (adult) (pediatric): G47.33

## 2018-01-08 ENCOUNTER — Encounter (HOSPITAL_COMMUNITY): Payer: Self-pay | Admitting: Nurse Practitioner

## 2018-01-08 NOTE — Progress Notes (Signed)
Primary Care Physician: Shon Baton, MD Primary Cardiologist: Percival Spanish Primary Electrophysiologist: Marlana Salvage is a 78 y.o. female with a history of paroxysmal atrial fibrillation who presents for follow up in the [facility]. She was admitted for [facility] and did well with load.  She saw Dr Percival Spanish in follow up earlier this week and was back in AF with RVR. EKG read out with long QTc and her tikosyn was discontinued.  She was asked to follow up today to discuss further options. She is back in SR today with a QTc of 476msec. She is disappointed that she had to stop taking the Tikosyn.  Today, she denies symptoms of palpitations, chest pain, shortness of breath, orthopnea, PND, lower extremity edema, dizziness, presyncope, syncope, snoring, daytime somnolence, bleeding, or neurologic sequela. The patient is tolerating medications without difficulties and is otherwise without complaint today.    Atrial Fibrillation Risk Factors:  she does have symptoms or diagnosis of sleep apnea. CPAP titration pending  she does not have a history of rheumatic fever.  she does not have a history of alcohol use.  she has a BMI of Body mass index is 27.79 kg/m.Marland Kitchen Filed Weights   01/05/18 1025  Weight: 75.8 kg    LA size: 35   Atrial Fibrillation Management history:  Previous antiarrhythmic drugs: Tikosyn (stopped 2/2 long QT on EKG in recurrent AF)  Previous cardioversions: 12/2017  Previous ablations: none  CHADS2VASC score: 3  Anticoagulation history: Eliquis   Past Medical History:  Diagnosis Date  . Allergy    seasonal  . Anemia   . Arthritis   . Cataract    bilateral removed  . Celiac disease   . Eczema   . H/O transfusion of platelets   . Hyperlipidemia   . Hypothyroidism   . OSA (obstructive sleep apnea)   . Osteopenia   . Paroxysmal atrial fibrillation (HCC)   . Shingles   . Vitamin D deficiency    Past Surgical History:    Procedure Laterality Date  . CARDIOVERSION N/A 12/14/2017   Procedure: CARDIOVERSION;  Surgeon: Sanda Klein, MD;  Location: [facility];  Service: Cardiovascular;  Laterality: N/A;  . CATARACT EXTRACTION     bilateral  . COLONOSCOPY    . DILATION AND CURETTAGE OF UTERUS    . UPPER GI ENDOSCOPY      Current Outpatient Medications  Medication Sig Dispense Refill  . alendronate (FOSAMAX) 70 MG tablet Take 70 mg by mouth every Tuesday.     Marland Kitchen apixaban (ELIQUIS) 5 MG TABS tablet Take 1 tablet (5 mg total) by mouth 2 (two) times daily. 180 tablet 1  . Calcium Carbonate-Vitamin D (CALCIUM 600+D PO) Take 1 tablet by mouth daily at 12 noon.    . cholecalciferol (VITAMIN D) 400 units TABS tablet Take 400 Units by mouth daily at 12 noon.    . Ferrous Sulfate (SLOW FE PO) Take 45 mg by mouth every Monday, Wednesday, and Friday.     . levothyroxine (LEVOTHROID) 88 MCG tablet Take 88 mcg by mouth daily.      Marland Kitchen loratadine (CLARITIN) 10 MG tablet Take 10 mg by mouth every evening.     Marland Kitchen losartan (COZAAR) 25 MG tablet Take 25 mg daily by mouth.    . MELATONIN PO Take 2 mg by mouth daily as needed (sleep).    . metoprolol succinate (TOPROL-XL) 50 MG 24 hr tablet Take 1 tablet (50 mg total) by mouth daily.  Take with or immediately following a meal. 30 tablet 6  . Multiple Vitamins-Minerals (ADULT ONE DAILY GUMMIES) CHEW Chew 2 tablets by mouth daily.    . multivitamin-lutein (OCUVITE-LUTEIN) CAPS capsule Take 1 capsule by mouth daily with supper.     Marland Kitchen olopatadine (PATANOL) 0.1 % ophthalmic solution Place 1 drop into both eyes as needed for allergies.     Marland Kitchen omeprazole (PRILOSEC) 20 MG capsule Take 20 mg by mouth at bedtime.    Marland Kitchen oxybutynin (DITROPAN) 5 MG tablet Take 2.5 mg every evening by mouth.    . rosuvastatin (CRESTOR) 5 MG tablet Take 5 mg by mouth every evening.   3   No current facility-administered medications for this encounter.     Allergies  Allergen Reactions  . Betadine [Povidone  Iodine]     itching    Social History   Socioeconomic History  . Marital status: Married    Spouse name: Not on file  . Number of children: 4  . Years of education: Not on file  . Highest education level: Not on file  Occupational History  . Occupation: retired    Fish farm manager: RETIRED  Social Needs  . Financial resource strain: Not on file  . Food insecurity:    Worry: Not on file    Inability: Not on file  . Transportation needs:    Medical: Not on file    Non-medical: Not on file  Tobacco Use  . Smoking status: Never Smoker  . Smokeless tobacco: Never Used  Substance and Sexual Activity  . Alcohol use: No    Alcohol/week: 0.0 standard drinks  . Drug use: No  . Sexual activity: Not on file  Lifestyle  . Physical activity:    Days per week: Not on file    Minutes per session: Not on file  . Stress: Not on file  Relationships  . Social connections:    Talks on phone: Not on file    Gets together: Not on file    Attends religious service: Not on file    Active member of club or organization: Not on file    Attends meetings of clubs or organizations: Not on file    Relationship status: Not on file  . Intimate partner violence:    Fear of current or ex partner: Not on file    Emotionally abused: Not on file    Physically abused: Not on file    Forced sexual activity: Not on file  Other Topics Concern  . Not on file  Social History Narrative   Daily caffeine    Family History  Problem Relation Age of Onset  . Liver cancer Mother   . Diabetes Father   . Heart disease Father   . Colon cancer Neg Hx     ROS- All systems are reviewed and negative except as per the HPI above.  Physical Exam: Vitals:   01/05/18 1025  BP: (!) 162/78  Pulse: 60  Weight: 75.8 kg  Height: 5\' 5"  (1.651 m)    GEN- The patient is elderly appearing, alert and oriented x 3 today.   Head- normocephalic, atraumatic Eyes-  Sclera clear, conjunctiva pink Ears- hearing  intact Oropharynx- clear Neck- supple  Lungs- Clear to ausculation bilaterally, normal work of breathing Heart- Regular rate and rhythm  GI- soft, NT, ND, + BS Extremities- no clubbing, cyanosis, or edema MS- no significant deformity or atrophy Skin- no rash or lesion Psych- euthymic mood, full affect Neuro- strength and sensation  are intact  Wt Readings from Last 3 Encounters:  01/05/18 75.8 kg  01/02/18 76.5 kg  12/25/17 76.7 kg    EKG today demonstrates sinus rhythm, rate 60, QTc 460msec  Epic records are reviewed at length today  Assessment and Plan:  1. Paroxysmal atrial fibrillation She did well with Tikosyn load. She was seen earlier this week and Tikosyn was discontinued as she was in recurrent AF with long QTc on EKG read. She is back in SR today Continue Eliquis for CHADS2VASC of 3 Will increase BB today Follow up with CPAP titration She would be reasonable to consider ablation for - I did not discuss with her today Follow up with AF clinic in 4 weeks after CPAP titration done. If recurrent AF, would refer to Dr Rayann Heman for ablation (case reviewed with him)   Chanetta Marshall, NP 01/08/2018 11:29 AM

## 2018-01-12 ENCOUNTER — Ambulatory Visit (HOSPITAL_BASED_OUTPATIENT_CLINIC_OR_DEPARTMENT_OTHER): Payer: Medicare Other | Attending: Cardiology | Admitting: Cardiology

## 2018-01-12 ENCOUNTER — Ambulatory Visit: Payer: Medicare Other | Admitting: Internal Medicine

## 2018-01-12 VITALS — Ht 65.0 in | Wt 169.0 lb

## 2018-01-12 DIAGNOSIS — G4733 Obstructive sleep apnea (adult) (pediatric): Secondary | ICD-10-CM | POA: Diagnosis not present

## 2018-01-12 DIAGNOSIS — Z79899 Other long term (current) drug therapy: Secondary | ICD-10-CM | POA: Insufficient documentation

## 2018-01-13 NOTE — Procedures (Signed)
   Patient Name: Susan Bell, Susan Bell Date: 01/12/2018 Gender: Female D.O.B: 12-29-1940 Age (years): 77 Referring Provider: Sherran Needs Height (inches): 66 Interpreting Physician: Fransico Him MD, ABSM Weight (lbs): 168 RPSGT: Earney Hamburg BMI: 28 MRN: [id number] Neck Size: 15.00  CLINICAL INFORMATION The patient is referred for a CPAP titration to treat sleep apnea.  SLEEP STUDY TECHNIQUE As per the AASM Manual for the Scoring of Sleep and Associated Events v2.3 (April 2016) with a hypopnea requiring 4% desaturations.  The channels recorded and monitored were frontal, central and occipital EEG, electrooculogram (EOG), submentalis EMG (chin), nasal and oral airflow, thoracic and abdominal wall motion, anterior tibialis EMG, snore microphone, electrocardiogram, and pulse oximetry. Continuous positive airway pressure (CPAP) was initiated at the beginning of the study and titrated to treat sleep-disordered breathing.  MEDICATIONS Medications self-administered by patient taken the night of the study : LOSARTAN, OXYBUTYNIN CHLORIDE, PRILOSEC  TECHNICIAN COMMENTS Comments added by technician: Patient had difficulty initiating sleep. Patient was restless all through the night. Comments added by scorer: N/A  RESPIRATORY PARAMETERS Optimal PAP Pressure (cm):10  AHI at Optimal Pressure (/hr):0 Overall Minimal O2 (%):93.0  Supine % at Optimal Pressure (%):93.0 Minimal O2 at Optimal Pressure (%): 93.0   SLEEP ARCHITECTURE The study was initiated at 10:31:44 PM and ended at 4:36:51 AM.  Sleep onset time was 25.3 minutes and the sleep efficiency was 41.6%%. The total sleep time was 152 minutes.  The patient spent 10.9%% of the night in stage N1 sleep, 69.1%% in stage N2 sleep, 0.0%% in stage N3 and 20.1% in REM.Stage REM latency was 129.5 minutes  Wake after sleep onset was 187.8. Alpha intrusion was absent. Supine sleep was 42.43%.  CARDIAC DATA The 2 lead EKG demonstrated  sinus rhythm. The mean heart rate was 70.4 beats per minute. Other EKG findings include: None.  LEG MOVEMENT DATA The total Periodic Limb Movements of Sleep (PLMS) were 0. The PLMS index was 0.0. A PLMS index of <15 is considered normal in adults.  IMPRESSIONS - An optimal PAP pressure of 10cm H2O was selected for this patient based on the available study data. - Central sleep apnea was not noted during this titration (CAI = 0.0/h). - Significant oxygen desaturations were not observed during this titration (min O2 = 93.0%). - The patient snored with soft snoring volume during this titration study. - No cardiac abnormalities were observed during this study. - Clinically significant periodic limb movements were not noted during this study. Arousals associated with PLMs were rare.  DIAGNOSIS - Obstructive Sleep Apnea (327.23 [G47.33 ICD-10])  RECOMMENDATIONS - Recommend CPAP at 10cm H2O with heated humidity and mask of choice.  - Avoid alcohol, sedatives and other CNS depressants that may worsen sleep apnea and disrupt normal sleep architecture. - Sleep hygiene should be reviewed to assess factors that may improve sleep quality. - Weight management and regular exercise should be initiated or continued. - Return to [facility] Center for re-evaluation after 10 weeks of therapy  [Electronically signed] 01/13/2018 08:14 PM  Fransico Him MD, ABSM Diplomate, American Board of Sleep Medicine

## 2018-01-16 ENCOUNTER — Telehealth: Payer: Self-pay | Admitting: *Deleted

## 2018-01-16 NOTE — Telephone Encounter (Signed)
Informed patient of titration results and verbalized understanding was indicated. Patient understands her titration study showed they had a successful PAP titration and orders are in EPIC.  Pt is aware and agreeable to thses results. Upon patient request DME selection is [facility]. Patient understands she will be contacted by [facility] to set up her cpap. Patient understands to call if [facility] does not contact her with new setup in a timely manner. Patient understands they will be called once confirmation has been received from [facility] that they have received their new machine to schedule 10 week follow up appointment.  [facility] notified of new cpap order  Please add to airview Patient was grateful for the call and thanked me.

## 2018-01-16 NOTE — Telephone Encounter (Signed)
-----   Message from Sueanne Margarita, MD sent at 01/13/2018  8:18 PM EDT ----- Please let patient know that they had a successful PAP titration and let DME know that orders are in EPIC.  Please set up 10 week OV with me.

## 2018-01-24 NOTE — Telephone Encounter (Signed)
Patient has a 10 week follow up appointment scheduled for 04/09/2018. Patient understands she needs to keep this appointment for insurance compliance. Patient was grateful for the call and thanked me.

## 2018-01-25 DIAGNOSIS — R350 Frequency of micturition: Secondary | ICD-10-CM | POA: Diagnosis not present

## 2018-01-25 DIAGNOSIS — N390 Urinary tract infection, site not specified: Secondary | ICD-10-CM | POA: Diagnosis not present

## 2018-01-25 DIAGNOSIS — M545 Low back pain: Secondary | ICD-10-CM | POA: Diagnosis not present

## 2018-01-25 DIAGNOSIS — Z6828 Body mass index (BMI) 28.0-28.9, adult: Secondary | ICD-10-CM | POA: Diagnosis not present

## 2018-01-28 IMAGING — CT CT MAXILLOFACIAL W/O CM
3 series · 15 of 47 positions shown, 18 images · non-contrast
Comparison: 04/15/2010 head CT

CLINICAL DATA: Fall, hit face on concrete, broken front tooth with
laceration to the bottom lip

EXAM:
CT MAXILLOFACIAL WITHOUT CONTRAST
TECHNIQUE: Multidetector CT imaging of the maxillofacial structures was
performed. Multiplanar CT image reconstructions were also generated.

[Series 3: facial st · axial · 0.29mm/px · z∈[-184,-52]mm · 9 of 78 slices shown, 12 images]
[im 6/78  brain]
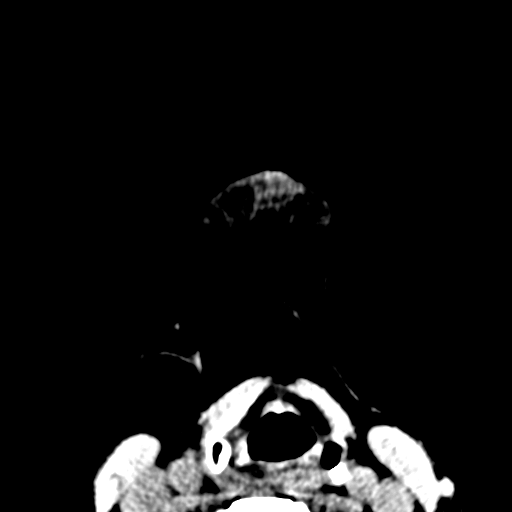
[im 6/78  bone]
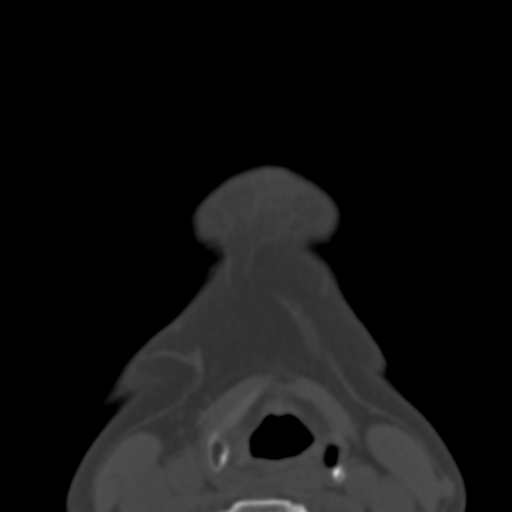
[im 14/78  bone]
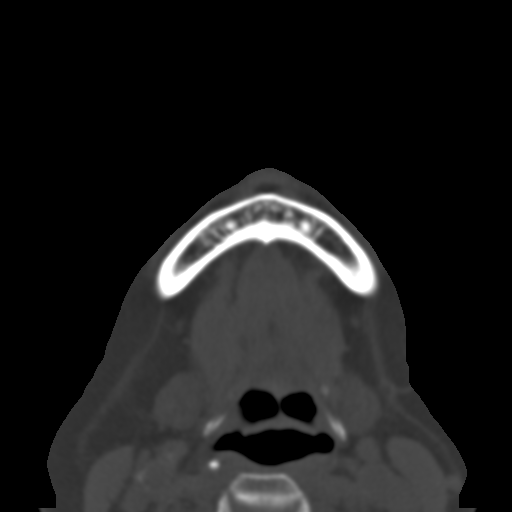
[im 22/78  bone]
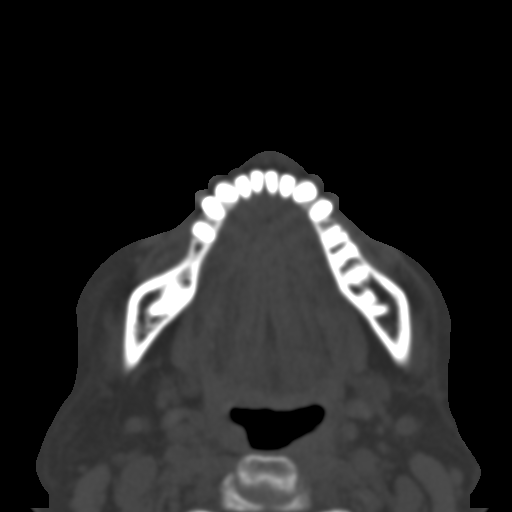
[im 30/78  bone]
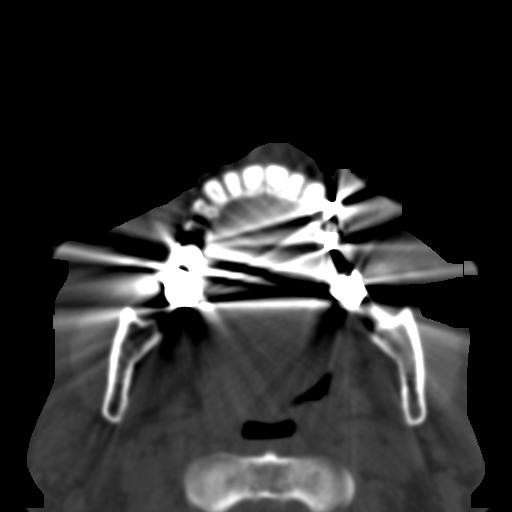
[im 40/78  brain]
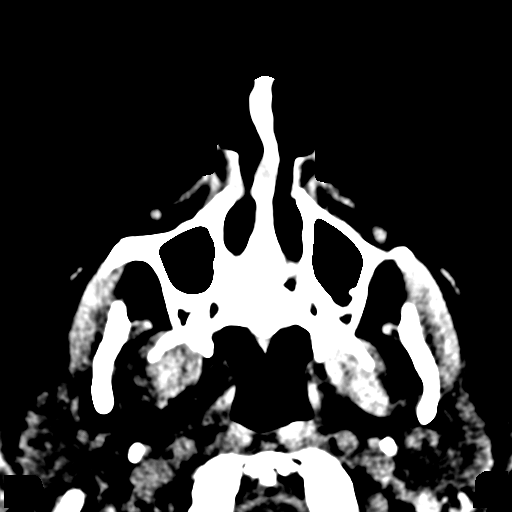
[im 40/78  bone]
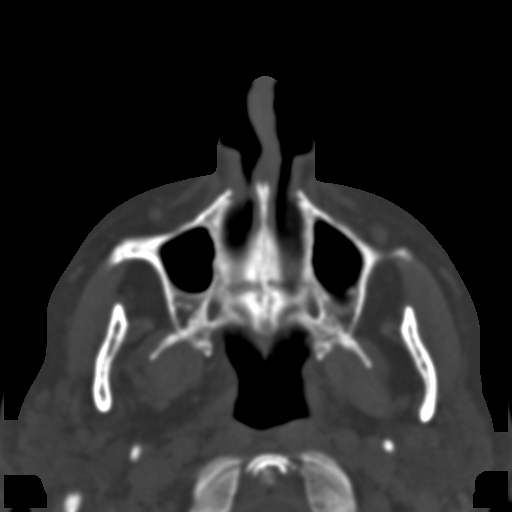
[im 48/78  bone]
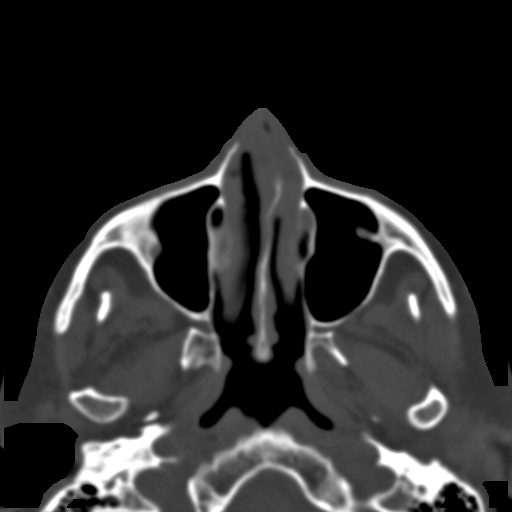
[im 56/78  bone]
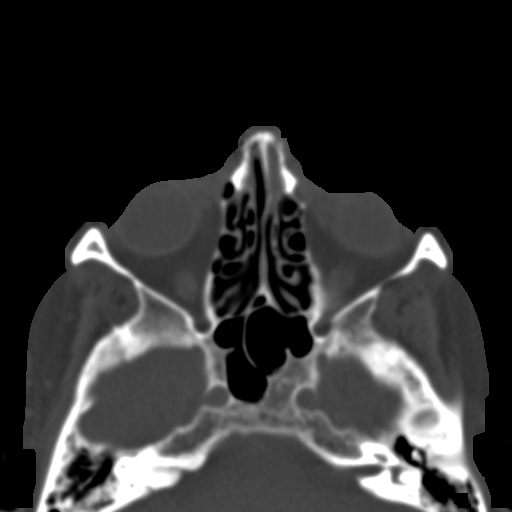
[im 64/78  bone]
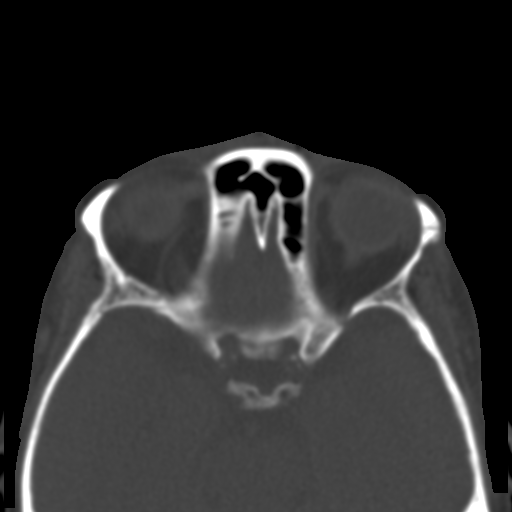
[im 72/78  brain]
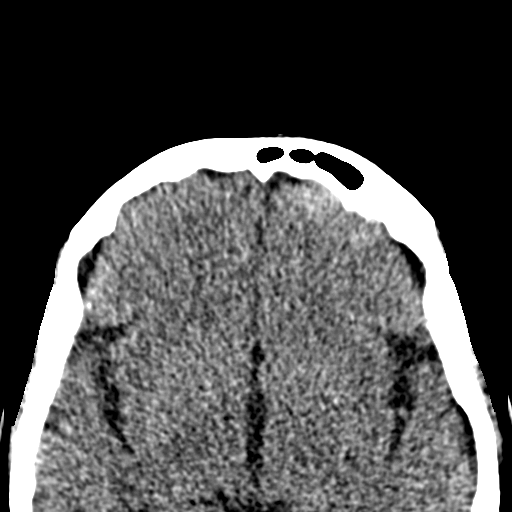
[im 72/78  bone]
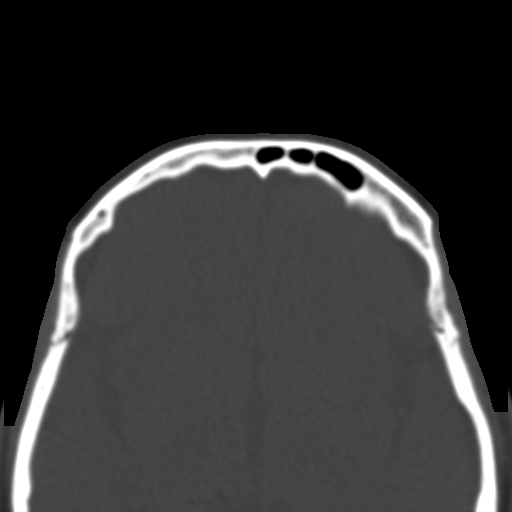

[Series 7: coronal st · coronal · 0.29mm/px · 3 of 76 slices shown]
[im 26/76  bone]
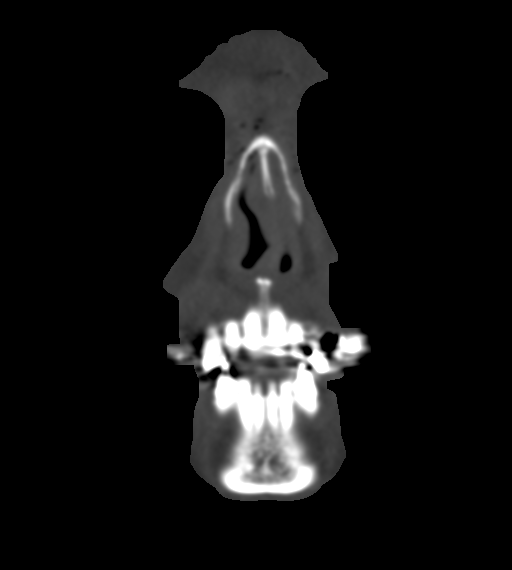
[im 34/76  bone]
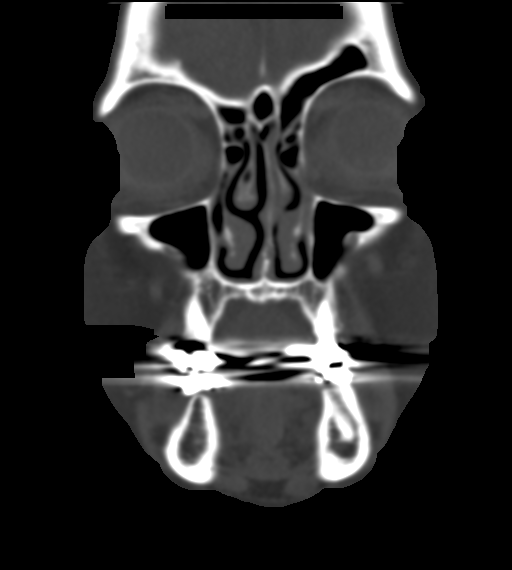
[im 42/76  bone]
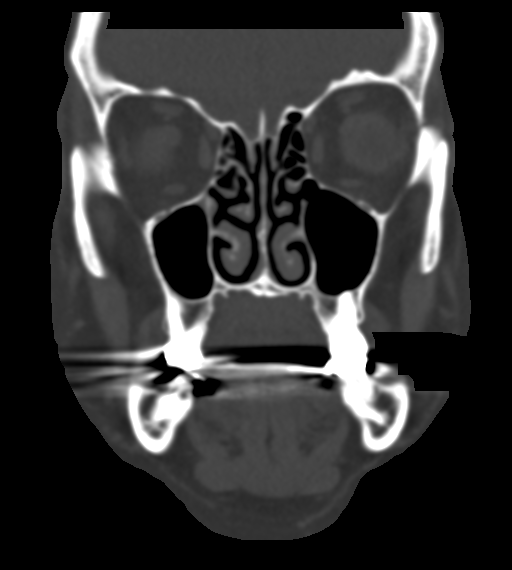

[Series 8: sagittal st · sagittal · 0.29mm/px · 3 of 76 slices shown]
[im 26/76  bone]
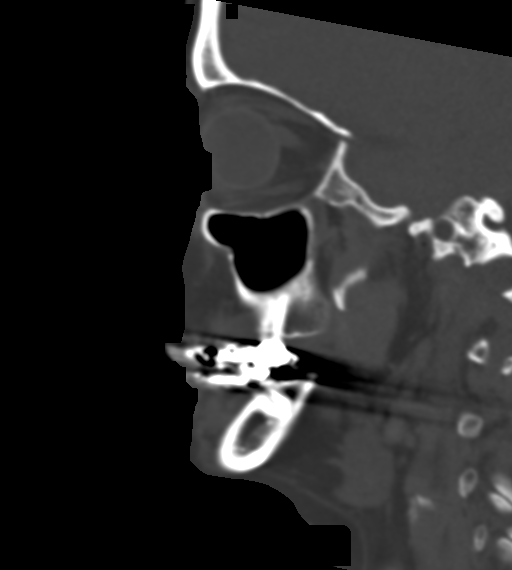
[im 38/76  bone]
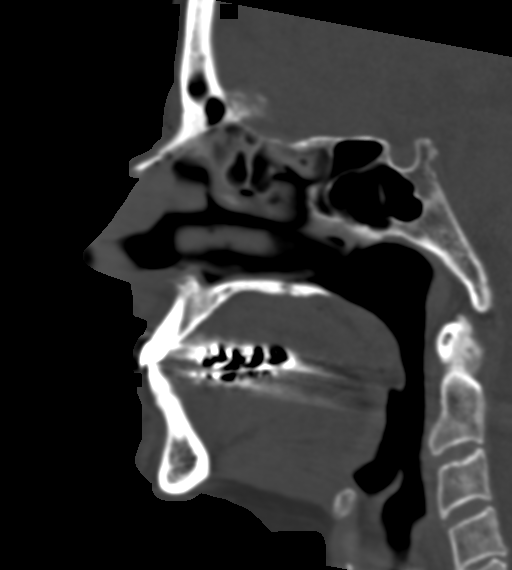
[im 51/76  bone]
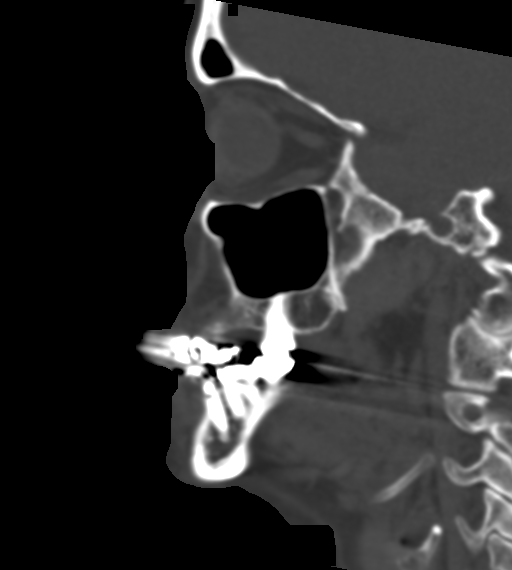

[15 of 47 positions shown; findings below may reference images not displayed]

FINDINGS: Osseous: Mandibular heads are normally positioned. No mandibular
fracture. Pterygoid plates and zygomatic arches are intact. Acute,
comminuted and displaced bilateral nasal bone fracture. Reverse S
shaped deformity of the mid to anterior nasal septum with fracture
through the anterior septum. Chip fracture through the right
maxillary central incisor on coronal views.

Orbits: Negative. No traumatic or inflammatory finding.

Sinuses: No fluid level. Mild mucosal thickening in the ethmoid
sinuses.

Soft tissues: Large amount of soft tissue swelling over the nasal
area with small soft tissue gas present.

Limited intracranial: No significant or unexpected finding.
IMPRESSION: 1. Acute, comminuted and displaced bilateral nasal bone fracture.
Reverse S shaped deformity of the nasal septum with fracture through
the anterior bony nasal septum.
2. Chip fracture through right maxillary central incisor.
3. Moderate to large amount of soft tissue swelling over the upper
lip and nasal area with multiple foci of soft tissue gas around the
nose.

## 2018-01-30 DIAGNOSIS — M25512 Pain in left shoulder: Secondary | ICD-10-CM | POA: Insufficient documentation

## 2018-01-30 DIAGNOSIS — M13862 Other specified arthritis, left knee: Secondary | ICD-10-CM | POA: Diagnosis not present

## 2018-01-30 DIAGNOSIS — M13861 Other specified arthritis, right knee: Secondary | ICD-10-CM | POA: Diagnosis not present

## 2018-02-02 ENCOUNTER — Ambulatory Visit (HOSPITAL_COMMUNITY)
Admission: RE | Admit: 2018-02-02 | Discharge: 2018-02-02 | Disposition: A | Discharge status: Home / Self Care | Payer: Medicare Other | Source: Ambulatory Visit | Attending: Nurse Practitioner | Admitting: Nurse Practitioner

## 2018-02-02 ENCOUNTER — Encounter (HOSPITAL_COMMUNITY): Payer: Self-pay | Admitting: Nurse Practitioner

## 2018-02-02 VITALS — BP 168/82 | HR 57 | Ht 65.0 in | Wt 165.0 lb

## 2018-02-02 DIAGNOSIS — R9431 Abnormal electrocardiogram [ECG] [EKG]: Secondary | ICD-10-CM | POA: Diagnosis not present

## 2018-02-02 DIAGNOSIS — E785 Hyperlipidemia, unspecified: Secondary | ICD-10-CM | POA: Insufficient documentation

## 2018-02-02 DIAGNOSIS — D649 Anemia, unspecified: Secondary | ICD-10-CM | POA: Diagnosis not present

## 2018-02-02 DIAGNOSIS — I501 Left ventricular failure: Secondary | ICD-10-CM | POA: Insufficient documentation

## 2018-02-02 DIAGNOSIS — M1990 Unspecified osteoarthritis, unspecified site: Secondary | ICD-10-CM | POA: Insufficient documentation

## 2018-02-02 DIAGNOSIS — E559 Vitamin D deficiency, unspecified: Secondary | ICD-10-CM | POA: Insufficient documentation

## 2018-02-02 DIAGNOSIS — Z79899 Other long term (current) drug therapy: Secondary | ICD-10-CM | POA: Diagnosis not present

## 2018-02-02 DIAGNOSIS — Z833 Family history of diabetes mellitus: Secondary | ICD-10-CM | POA: Insufficient documentation

## 2018-02-02 DIAGNOSIS — Z9889 Other specified postprocedural states: Secondary | ICD-10-CM | POA: Insufficient documentation

## 2018-02-02 DIAGNOSIS — Z7901 Long term (current) use of anticoagulants: Secondary | ICD-10-CM | POA: Diagnosis not present

## 2018-02-02 DIAGNOSIS — Z888 Allergy status to other drugs, medicaments and biological substances status: Secondary | ICD-10-CM | POA: Diagnosis not present

## 2018-02-02 DIAGNOSIS — G4733 Obstructive sleep apnea (adult) (pediatric): Secondary | ICD-10-CM | POA: Insufficient documentation

## 2018-02-02 DIAGNOSIS — I480 Paroxysmal atrial fibrillation: Secondary | ICD-10-CM

## 2018-02-02 DIAGNOSIS — E039 Hypothyroidism, unspecified: Secondary | ICD-10-CM | POA: Insufficient documentation

## 2018-02-02 DIAGNOSIS — I517 Cardiomegaly: Secondary | ICD-10-CM | POA: Diagnosis not present

## 2018-02-02 DIAGNOSIS — Z8249 Family history of ischemic heart disease and other diseases of the circulatory system: Secondary | ICD-10-CM | POA: Diagnosis not present

## 2018-02-02 DIAGNOSIS — Z79890 Hormone replacement therapy (postmenopausal): Secondary | ICD-10-CM | POA: Diagnosis not present

## 2018-02-02 DIAGNOSIS — I10 Essential (primary) hypertension: Secondary | ICD-10-CM | POA: Insufficient documentation

## 2018-02-02 MED ORDER — LOSARTAN POTASSIUM 50 MG PO TABS: 50.0000 mg | ORAL_TABLET | Freq: Every day | ORAL | 6 refills | Status: DC

## 2018-02-02 NOTE — Patient Instructions (Signed)
Increase losartan to 50 mg a day

## 2018-02-02 NOTE — Progress Notes (Signed)
Primary Care Physician: Shon Baton, MD Referring Physician: [facility] ER f/u Cardiologist- pending establishing with Dr. Percival Spanish 10/1 EP- [name] is a 77 y.o. female with a h/o HLD, hypothyroidism  that was successfully cardioverted in the [facility] ER 7/25 for new onset Afib. She returned to  the afib office 8/2 but had gone back into  afib and was unaware. She was started on BB for better rate control.   She denies tobacco, alcohol, excessive caffeine. Has been told she snores and appeared to stop breathing at night. Sleep study done 8/30 showed OSA, and is pending cpap titration trial.  She had an echo that showed EF at 35% with diffuse hypokinesis. Lexi myoview did not show any ischemia but was an intermediate test.  She is already on an ARB/BB.   She remains in afib, rate controlled. She denies any exertional chest pain, is fatigued and has noted shortness of breath more so with steps. She is here now to purse rhythm control with Tikosyn. She has weaned off lexapro and qtc today is acceptable 437 ms. No benadryl and no missed dose of anticoagulation. Is applying for pt assistance for tikosyn, she does qualify.  F/u afib clinic 9/23, pt remains in SR now on Tikosyn. She feels improved with much more energy, less dyspnea. She just enjoyed a beach trip with her daughters.   F/u in afib clinic, the pt is s/p tiksoyn  admit with return of SR, but when seen by Dr. Percival Spanish, 10/1 she was in afib with RVR and prolonged qtc and Tikosyn was stopped. BB was increased and she has been in SR the last 2 visits in the afib clinic. Her BP has been elevated around 562 systolic at home.  Today, she denies symptoms of palpitations, chest pain, shortness of breath, orthopnea, PND, lower extremity edema, dizziness, presyncope, syncope, or neurologic sequela. The patient is tolerating medications without difficulties and is otherwise without complaint today.   Past Medical History:  Diagnosis  Date  . Allergy    seasonal  . Anemia   . Arthritis   . Cataract    bilateral removed  . Celiac disease   . Eczema   . H/O transfusion of platelets   . Hyperlipidemia   . Hypothyroidism   . OSA (obstructive sleep apnea)   . Osteopenia   . Paroxysmal atrial fibrillation (HCC)   . Shingles   . Vitamin D deficiency    Past Surgical History:  Procedure Laterality Date  . CARDIOVERSION N/A 12/14/2017   Procedure: CARDIOVERSION;  Surgeon: Sanda Klein, MD;  Location: [facility];  Service: Cardiovascular;  Laterality: N/A;  . CATARACT EXTRACTION     bilateral  . COLONOSCOPY    . DILATION AND CURETTAGE OF UTERUS    . UPPER GI ENDOSCOPY      Current Outpatient Medications  Medication Sig Dispense Refill  . alendronate (FOSAMAX) 70 MG tablet Take 70 mg by mouth every Tuesday.     Marland Kitchen apixaban (ELIQUIS) 5 MG TABS tablet Take 1 tablet (5 mg total) by mouth 2 (two) times daily. 180 tablet 1  . Calcium Carbonate-Vitamin D (CALCIUM 600+D PO) Take 1 tablet by mouth daily at 12 noon.    . cholecalciferol (VITAMIN D) 400 units TABS tablet Take 400 Units by mouth daily at 12 noon.    . Ferrous Sulfate (SLOW FE PO) Take 45 mg by mouth every Monday, Wednesday, and Friday.     . levothyroxine (LEVOTHROID)  88 MCG tablet Take 88 mcg by mouth daily.      Marland Kitchen loratadine (CLARITIN) 10 MG tablet Take 10 mg by mouth every evening.     Marland Kitchen losartan (COZAAR) 50 MG tablet Take 1 tablet (50 mg total) by mouth daily. 30 tablet 6  . MELATONIN PO Take 2 mg by mouth daily as needed (sleep).    . metoprolol succinate (TOPROL-XL) 50 MG 24 hr tablet Take 1 tablet (50 mg total) by mouth daily. Take with or immediately following a meal. 30 tablet 6  . Multiple Vitamins-Minerals (ADULT ONE DAILY GUMMIES) CHEW Chew 2 tablets by mouth daily.    . multivitamin-lutein (OCUVITE-LUTEIN) CAPS capsule Take 1 capsule by mouth daily with supper.     Marland Kitchen olopatadine (PATANOL) 0.1 % ophthalmic solution Place 1 drop into both eyes  as needed for allergies.     Marland Kitchen omeprazole (PRILOSEC) 20 MG capsule Take 20 mg by mouth at bedtime.    Marland Kitchen oxybutynin (DITROPAN) 5 MG tablet Take 2.5 mg every evening by mouth.    . rosuvastatin (CRESTOR) 5 MG tablet Take 5 mg by mouth every evening.   3   No current facility-administered medications for this encounter.     Allergies  Allergen Reactions  . Betadine [Povidone Iodine]     itching    Social History   Socioeconomic History  . Marital status: Married    Spouse name: Not on file  . Number of children: 4  . Years of education: Not on file  . Highest education level: Not on file  Occupational History  . Occupation: retired    Fish farm manager: RETIRED  Social Needs  . Financial resource strain: Not on file  . Food insecurity:    Worry: Not on file    Inability: Not on file  . Transportation needs:    Medical: Not on file    Non-medical: Not on file  Tobacco Use  . Smoking status: Never Smoker  . Smokeless tobacco: Never Used  Substance and Sexual Activity  . Alcohol use: No    Alcohol/week: 0.0 standard drinks  . Drug use: No  . Sexual activity: Not on file  Lifestyle  . Physical activity:    Days per week: Not on file    Minutes per session: Not on file  . Stress: Not on file  Relationships  . Social connections:    Talks on phone: Not on file    Gets together: Not on file    Attends religious service: Not on file    Active member of club or organization: Not on file    Attends meetings of clubs or organizations: Not on file    Relationship status: Not on file  . Intimate partner violence:    Fear of current or ex partner: Not on file    Emotionally abused: Not on file    Physically abused: Not on file    Forced sexual activity: Not on file  Other Topics Concern  . Not on file  Social History Narrative   Daily caffeine    Family History  Problem Relation Age of Onset  . Liver cancer Mother   . Diabetes Father   . Heart disease Father   . Colon  cancer Neg Hx     ROS- All systems are reviewed and negative except as per the HPI above  Physical Exam: Vitals:   02/02/18 0838  BP: (!) 168/82  Pulse: (!) 57  Weight: 74.8 kg  Height: 5'  5" (1.651 m)   Wt Readings from Last 3 Encounters:  02/02/18 74.8 kg  01/12/18 76.7 kg  01/05/18 75.8 kg    Labs: Lab Results  Component Value Date   NA 132 (L) 12/25/2017   K 3.9 12/25/2017   CL 99 12/25/2017   CO2 23 12/25/2017   GLUCOSE 102 (H) 12/25/2017   BUN 12 12/25/2017   CREATININE 0.72 12/25/2017   CALCIUM 8.9 12/25/2017   MG 2.0 12/25/2017   No results found for: INR No results found for: CHOL, HDL, LDLCALC, TRIG   GEN- The patient is well appearing, alert and oriented x 3 today.   Head- normocephalic, atraumatic Eyes-  Sclera clear, conjunctiva pink Ears- hearing intact Oropharynx- clear Neck- supple, no JVP Lymph- no cervical lymphadenopathy Lungs- Clear to ausculation bilaterally, normal work of breathing Heart- regular rate and rhythm, no murmurs, rubs or gallops, PMI not laterally displaced GI- soft, NT, ND, + BS Extremities- no clubbing, cyanosis, or edema MS- no significant deformity or atrophy Skin- no rash or lesion Psych- euthymic mood, full affect Neuro- strength and sensation are intact  EKG-NSR at 57 bpm, pr int 180 ms, qtc 395 ms(stable) Epic records reviewed Echo-Study Conclusions  - Left ventricle: The cavity size was normal. Wall thickness was   increased in a pattern of mild LVH. The estimated ejection   fraction was 35%. Diffuse hypokinesis. The study was not   technically sufficient to allow evaluation of LV diastolic   dysfunction due to atrial fibrillation. - Aortic valve: Trileaflet; moderately calcified leaflets.   Sclerosis without stenosis. - Mitral valve: Mildly calcified annulus. There was trivial   regurgitation. - Left atrium: The atrium was moderately dilated. - Right ventricle: The cavity size was normal. Systolic  function   was mildly reduced. - Right atrium: The atrium was moderately dilated. - Tricuspid valve: Peak RV-RA gradient (S): 25 mm Hg. - Pulmonary arteries: PA peak pressure: 28 mm Hg (S). - Inferior vena cava: The vessel was normal in size. The   respirophasic diameter changes were in the normal range (>= 50%),   consistent with normal central venous pressure.  Impressions:  - The patient was in atrial fibrillation. Normal LV size with mild   LV hypertrophy. EF 35%, diffuse hypokinesis. Normal RV size with   mildly decreased systolic function. Moderate biatrial   enlargement.  Lexi myoview-Study Highlights     Nuclear stress EF: 32%.  The study is normal.  This is an intermediate risk study.  The left ventricular ejection fraction is moderately decreased (30-44%).   No evidence for prior infarct or ischemia but severely decreased LVEF.   CT cardiac scoring 06/22/17-IMPRESSION: 1. LAD and circumflex coronary artery calcification.  2. Total Agatston Score: 772  3. MESA age and sex matched database percentile: 89 th  41.  Bilateral small effusions.  Assessment and Plan: 1. Paroxysmal afib Successful cardioversion in the ER 7/25 but with ERAF Echo showed EF 35%, stress test intermediate risk , but no ischemia by test S/p successful Tikosyn admit but stopped for prolonged qtc and afib RVR with Dr. Percival Spanish 10/1 She is maintaining SR for now Sleep study did show OSA and is now using CPAP  2. LV dysfunction at 35% Reduced EF may be a consequence of the  afib Hopefully with restoration of SR, EF will improve Will need repeat echo in 3 motnhs Continue arb/BB Weight is stable  3. Chadsvasc score of  3 Continue eliquis 5 mg bid   4.  HTN Increase losartan 50 mg daily   F/u 2 weeks with nurse visit for BP check and bmet F/u in 3 months in clinic Dr. Percival Spanish in 6 months  Geroge Baseman. Jaymon Dudek, [facility] [address] [place], [place] [address] [phone]

## 2018-02-05 DIAGNOSIS — M5416 Radiculopathy, lumbar region: Secondary | ICD-10-CM | POA: Diagnosis not present

## 2018-02-07 DIAGNOSIS — M5416 Radiculopathy, lumbar region: Secondary | ICD-10-CM | POA: Diagnosis not present

## 2018-02-12 DIAGNOSIS — M5416 Radiculopathy, lumbar region: Secondary | ICD-10-CM | POA: Diagnosis not present

## 2018-02-14 DIAGNOSIS — M5416 Radiculopathy, lumbar region: Secondary | ICD-10-CM | POA: Diagnosis not present

## 2018-02-16 ENCOUNTER — Other Ambulatory Visit (HOSPITAL_COMMUNITY): Payer: Self-pay | Admitting: *Deleted

## 2018-02-16 ENCOUNTER — Ambulatory Visit (HOSPITAL_COMMUNITY)
Admission: RE | Admit: 2018-02-16 | Discharge: 2018-02-16 | Disposition: A | Discharge status: Home / Self Care | Payer: Medicare Other | Source: Ambulatory Visit | Attending: Nurse Practitioner | Admitting: Nurse Practitioner

## 2018-02-16 DIAGNOSIS — I10 Essential (primary) hypertension: Secondary | ICD-10-CM | POA: Insufficient documentation

## 2018-02-16 LAB — BASIC METABOLIC PANEL
Anion gap: 6 (ref 5–15)
BUN: 11 mg/dL (ref 8–23)
CO2: 25 mmol/L (ref 22–32)
Calcium: 8.9 mg/dL (ref 8.9–10.3)
Chloride: 99 mmol/L (ref 98–111)
Creatinine, Ser: 0.88 mg/dL (ref 0.44–1.00)
GFR calc Af Amer: >60 mL/min (ref >60)
GFR calc non Af Amer: >60 mL/min (ref >60)
Glucose, Bld: 86 mg/dL (ref 70–99)
Potassium: 4.3 mmol/L (ref 3.5–5.1)
Sodium: 130 mmol/L — ABNORMAL LOW (ref 135–145)

## 2018-02-16 MED ORDER — AMLODIPINE BESYLATE 5 MG PO TABS: 2.5000 mg | ORAL_TABLET | Freq: Every day | ORAL | 3 refills | Status: DC

## 2018-02-16 MED ORDER — LOSARTAN POTASSIUM 50 MG PO TABS: 25.0000 mg | ORAL_TABLET | Freq: Every day | ORAL | 6 refills | Status: DC

## 2018-02-16 MED ORDER — LOSARTAN POTASSIUM 50 MG PO TABS: 100.0000 mg | ORAL_TABLET | Freq: Every day | ORAL | 6 refills | Status: DC

## 2018-02-16 NOTE — Patient Instructions (Signed)
Increase losartan to 100mg once a day 

## 2018-02-16 NOTE — Progress Notes (Signed)
Pt in for BP recheck after increase in losartan to 50 mg for poorly controlled BP. Still on recheck, 758 systolic. Bmet today for increase in ARB. Will increase to 100 mg daily and see back in 2 weeks for BP recheck and bmet.

## 2018-02-20 DIAGNOSIS — M5416 Radiculopathy, lumbar region: Secondary | ICD-10-CM | POA: Diagnosis not present

## 2018-02-22 DIAGNOSIS — M5416 Radiculopathy, lumbar region: Secondary | ICD-10-CM | POA: Diagnosis not present

## 2018-02-26 DIAGNOSIS — M5416 Radiculopathy, lumbar region: Secondary | ICD-10-CM | POA: Diagnosis not present

## 2018-02-28 DIAGNOSIS — M5416 Radiculopathy, lumbar region: Secondary | ICD-10-CM | POA: Diagnosis not present

## 2018-03-05 DIAGNOSIS — M5416 Radiculopathy, lumbar region: Secondary | ICD-10-CM | POA: Diagnosis not present

## 2018-03-08 ENCOUNTER — Encounter (HOSPITAL_COMMUNITY): Payer: Self-pay | Admitting: Nurse Practitioner

## 2018-03-08 ENCOUNTER — Ambulatory Visit (HOSPITAL_COMMUNITY)
Admission: RE | Admit: 2018-03-08 | Discharge: 2018-03-08 | Disposition: A | Discharge status: Home / Self Care | Payer: Medicare Other | Source: Ambulatory Visit | Attending: Nurse Practitioner | Admitting: Nurse Practitioner

## 2018-03-08 DIAGNOSIS — Z79899 Other long term (current) drug therapy: Secondary | ICD-10-CM | POA: Insufficient documentation

## 2018-03-08 DIAGNOSIS — Z5181 Encounter for therapeutic drug level monitoring: Secondary | ICD-10-CM | POA: Insufficient documentation

## 2018-03-08 LAB — BASIC METABOLIC PANEL
Anion gap: 9 (ref 5–15)
BUN: 12 mg/dL (ref 8–23)
CO2: 25 mmol/L (ref 22–32)
Calcium: 9 mg/dL (ref 8.9–10.3)
Chloride: 98 mmol/L (ref 98–111)
Creatinine, Ser: 0.97 mg/dL (ref 0.44–1.00)
GFR calc Af Amer: >60 mL/min (ref >60)
GFR calc non Af Amer: 56 mL/min — ABNORMAL LOW (ref >60)
Glucose, Bld: 94 mg/dL (ref 70–99)
Potassium: 4 mmol/L (ref 3.5–5.1)
Sodium: 132 mmol/L — ABNORMAL LOW (ref 135–145)

## 2018-03-08 MED ORDER — AMLODIPINE BESYLATE 5 MG PO TABS: 5.0000 mg | ORAL_TABLET | Freq: Every day | ORAL | 3 refills | Status: DC

## 2018-03-08 NOTE — Progress Notes (Signed)
Pt  is here for f/u of BP meds, she started amlodipine 5 mg , 1/2 tab 2 weeks ago and losartan was reduced back to 25 mg. She continues to have some elevated BP at home. Here BP is 150 /70. Will increase amlodipine to 5 mg daily,bmet pending, recheck BP in 2 weeks.

## 2018-03-08 NOTE — Patient Instructions (Signed)
Increase amlodipine to 5 mg a day

## 2018-03-13 ENCOUNTER — Other Ambulatory Visit (HOSPITAL_COMMUNITY): Payer: Self-pay | Admitting: *Deleted

## 2018-03-13 MED ORDER — AMLODIPINE BESYLATE 5 MG PO TABS: 5.0000 mg | ORAL_TABLET | Freq: Every day | ORAL | 6 refills | Status: DC

## 2018-03-21 ENCOUNTER — Ambulatory Visit (HOSPITAL_COMMUNITY)
Admission: RE | Admit: 2018-03-21 | Discharge: 2018-03-21 | Disposition: A | Discharge status: Home / Self Care | Payer: Medicare Other | Source: Ambulatory Visit | Attending: Nurse Practitioner | Admitting: Nurse Practitioner

## 2018-03-21 DIAGNOSIS — I480 Paroxysmal atrial fibrillation: Secondary | ICD-10-CM | POA: Diagnosis not present

## 2018-03-21 DIAGNOSIS — Z79899 Other long term (current) drug therapy: Secondary | ICD-10-CM | POA: Diagnosis not present

## 2018-03-21 DIAGNOSIS — I10 Essential (primary) hypertension: Secondary | ICD-10-CM | POA: Insufficient documentation

## 2018-03-21 NOTE — Progress Notes (Addendum)
Patient in for bp check 138/62 HR 54. BP at home have been well controlled. Roderic Palau NP to review.  Continue current meds without change. F/u as scheduled.

## 2018-03-23 ENCOUNTER — Other Ambulatory Visit (HOSPITAL_COMMUNITY): Payer: Self-pay | Admitting: *Deleted

## 2018-04-08 NOTE — Progress Notes (Signed)
Cardiology Office Note:    Date:  04/09/2018   ID:  Susan Bell, DOB 1941/03/24, MRN [id number]  PCP:  Shon Baton, MD  Cardiologist:  No primary care provider on file.    Referring MD: Shon Baton, MD   Chief Complaint  Patient presents with  . Sleep Apnea    History of Present Illness:    Susan Bell is a 78 y.o. female with a hx of PAF followed in afib clinic who was referred for sleep study.  She was found to have mild OAS with a AHI of 12.7/hr and underwent CPAP titration to 10cm H2O.  She is doing well with her CPAP device and thinks that she has gotten used to it.  She tolerates the mask and feels the pressure is adequate.  Since going on CPAP she feels rested in the am and has no significant daytime sleepiness.  She denies any significant mouth or nasal dryness or nasal congestion.  She does not think that he snores.     Past Medical History:  Diagnosis Date  . Allergy    seasonal  . Anemia   . Arthritis   . Cataract    bilateral removed  . Celiac disease   . Eczema   . H/O transfusion of platelets   . Hyperlipidemia   . Hypothyroidism   . OSA (obstructive sleep apnea)   . Osteopenia   . Paroxysmal atrial fibrillation (HCC)   . Shingles   . Vitamin D deficiency     Past Surgical History:  Procedure Laterality Date  . CARDIOVERSION N/A 12/14/2017   Procedure: CARDIOVERSION;  Surgeon: Sanda Klein, MD;  [facility];  Service: Cardiovascular;  Laterality: N/A;  . CATARACT EXTRACTION     bilateral  . COLONOSCOPY    . DILATION AND CURETTAGE OF UTERUS    . UPPER GI ENDOSCOPY      Current Medications: Current Meds  Medication Sig  . alendronate (FOSAMAX) 70 MG tablet Take 70 mg by mouth every Tuesday.   Marland Kitchen amLODipine (NORVASC) 5 MG tablet Take 1 tablet (5 mg total) by mouth daily.  Marland Kitchen apixaban (ELIQUIS) 5 MG TABS tablet Take 1 tablet (5 mg total) by mouth 2 (two) times daily.  . Calcium Carbonate-Vitamin D (CALCIUM 600+D PO) Take 1  tablet by mouth daily at 12 noon.  . cholecalciferol (VITAMIN D) 400 units TABS tablet Take 400 Units by mouth daily at 12 noon.  . Ferrous Sulfate (SLOW FE PO) Take 45 mg by mouth every Monday, Wednesday, and Friday.   . levothyroxine (LEVOTHROID) 88 MCG tablet Take 88 mcg by mouth daily.    Marland Kitchen loratadine (CLARITIN) 10 MG tablet Take 10 mg by mouth every evening.   Marland Kitchen losartan (COZAAR) 50 MG tablet Take 0.5 tablets (25 mg total) by mouth daily.  Marland Kitchen MELATONIN PO Take 2 mg by mouth daily as needed (sleep).  . metoprolol succinate (TOPROL-XL) 50 MG 24 hr tablet Take 1 tablet (50 mg total) by mouth daily. Take with or immediately following a meal.  . Multiple Vitamins-Minerals (ADULT ONE DAILY GUMMIES) CHEW Chew 2 tablets by mouth daily.  . multivitamin-lutein (OCUVITE-LUTEIN) CAPS capsule Take 1 capsule by mouth daily with supper.   Marland Kitchen olopatadine (PATANOL) 0.1 % ophthalmic solution Place 1 drop into both eyes as needed for allergies.   Marland Kitchen omeprazole (PRILOSEC) 20 MG capsule Take 20 mg by mouth at bedtime.  Marland Kitchen oxybutynin (DITROPAN) 5 MG tablet Take 2.5 mg every evening by mouth.  Marland Kitchen  rosuvastatin (CRESTOR) 5 MG tablet Take 5 mg by mouth every evening.      Allergies:   Betadine [povidone iodine]   Social History   Socioeconomic History  . Marital status: Married    Spouse name: Not on file  . Number of children: 4  . Years of education: Not on file  . Highest education level: Not on file  Occupational History  . Occupation: retired    Fish farm manager: RETIRED  Social Needs  . Financial resource strain: Not on file  . Food insecurity:    Worry: Not on file    Inability: Not on file  . Transportation needs:    Medical: Not on file    Non-medical: Not on file  Tobacco Use  . Smoking status: Never Smoker  . Smokeless tobacco: Never Used  Substance and Sexual Activity  . Alcohol use: No    Alcohol/week: 0.0 standard drinks  . Drug use: No  . Sexual activity: Not on file  Lifestyle  . Physical  activity:    Days per week: Not on file    Minutes per session: Not on file  . Stress: Not on file  Relationships  . Social connections:    Talks on phone: Not on file    Gets together: Not on file    Attends religious service: Not on file    Active member of club or organization: Not on file    Attends meetings of clubs or organizations: Not on file    Relationship status: Not on file  Other Topics Concern  . Not on file  Social History Narrative   Daily caffeine     Family History: The patient's family history includes Diabetes in her father; Heart disease in her father; Liver cancer in her mother. There is no history of Colon cancer.  ROS:   Please see the history of present illness.    ROS  All other systems reviewed and negative.   EKGs/Labs/Other Studies Reviewed:    The following studies were reviewed today: PAP download  EKG:  EKG is not ordered today.   Recent Labs: 10/26/2017: Hemoglobin 10.5; Platelets 374; TSH 0.468 12/25/2017: Magnesium 2.0 03/08/2018: BUN 12; Creatinine, Ser 0.97; Potassium 4.0; Sodium 132   Recent Lipid Panel No results found for: CHOL, TRIG, HDL, CHOLHDL, VLDL, LDLCALC, LDLDIRECT  Physical Exam:    VS:  Ht 5' 5"  (1.651 m)   BMI 27.39 kg/m     Wt Readings from Last 3 Encounters:  03/08/18 164 lb 9.6 oz (74.7 kg)  02/16/18 164 lb (74.4 kg)  02/02/18 165 lb (74.8 kg)     GEN:  Well nourished, well developed in no acute distress HEENT: Normal NECK: No JVD; No carotid bruits LYMPHATICS: No lymphadenopathy CARDIAC: RRR, no murmurs, rubs, gallops RESPIRATORY:  Clear to auscultation without rales, wheezing or rhonchi  ABDOMEN: Soft, non-tender, non-distended MUSCULOSKELETAL:  No edema; No deformity  SKIN: Warm and dry NEUROLOGIC:  Alert and oriented x 3 PSYCHIATRIC:  Normal affect   ASSESSMENT:    1. Obstructive sleep apnea syndrome    PLAN:    In order of problems listed above:  1.  OSA - the patient is tolerating PAP  therapy well without any problems. The PAP download was reviewed today and showed an AHI of 11.9/hr on 10 cm H2O with 80% compliance in using more than 4 hours nightly.  The patient has been using and benefiting from PAP use and will continue to benefit from therapy. Her  AHI is too high so I will get a 2 week autotitration from 4 to 18cm H2O.     Medication Adjustments/Labs and Tests Ordered: Current medicines are reviewed at length with the patient today.  Concerns regarding medicines are outlined above.  No orders of the defined types were placed in this encounter.  No orders of the defined types were placed in this encounter.   Signed, Fransico Him, MD  04/09/2018 9:01 AM    [place]

## 2018-04-09 ENCOUNTER — Encounter: Payer: Self-pay | Admitting: Cardiology

## 2018-04-09 ENCOUNTER — Telehealth: Payer: Self-pay | Admitting: *Deleted

## 2018-04-09 ENCOUNTER — Ambulatory Visit (INDEPENDENT_AMBULATORY_CARE_PROVIDER_SITE_OTHER): Payer: Medicare Other | Admitting: Cardiology

## 2018-04-09 VITALS — BP 120/58 | HR 58 | Ht 65.0 in | Wt 166.0 lb

## 2018-04-09 DIAGNOSIS — G4733 Obstructive sleep apnea (adult) (pediatric): Secondary | ICD-10-CM | POA: Diagnosis not present

## 2018-04-09 NOTE — Telephone Encounter (Signed)
-----   Message from Sarina Ill, RN sent at 04/09/2018  9:26 AM EST ----- Regarding: Sleep Hello,  Dr. Radford Pax ordered CPAP changes and a download in 2 weeks. Orders placed, please send. Thanks,  [employer]

## 2018-04-09 NOTE — Patient Instructions (Signed)

## 2018-04-09 NOTE — Telephone Encounter (Signed)
Order placed to [facility] today 2 wk d/l reminder made

## 2018-04-23 ENCOUNTER — Other Ambulatory Visit (HOSPITAL_COMMUNITY): Payer: Self-pay | Admitting: *Deleted

## 2018-04-23 MED ORDER — AMLODIPINE BESYLATE 5 MG PO TABS: 5.0000 mg | ORAL_TABLET | Freq: Every day | ORAL | 2 refills | Status: DC

## 2018-04-23 MED ORDER — APIXABAN 5 MG PO TABS: 5.0000 mg | ORAL_TABLET | Freq: Two times a day (BID) | ORAL | 2 refills | Status: DC

## 2018-04-23 MED ORDER — METOPROLOL SUCCINATE ER 50 MG PO TB24: 50.0000 mg | ORAL_TABLET | Freq: Every day | ORAL | 2 refills | Status: DC

## 2018-05-08 DIAGNOSIS — H11041 Peripheral pterygium, stationary, right eye: Secondary | ICD-10-CM | POA: Diagnosis not present

## 2018-05-08 DIAGNOSIS — H04123 Dry eye syndrome of bilateral lacrimal glands: Secondary | ICD-10-CM | POA: Diagnosis not present

## 2018-05-08 DIAGNOSIS — H1851 Endothelial corneal dystrophy: Secondary | ICD-10-CM | POA: Diagnosis not present

## 2018-05-08 DIAGNOSIS — H40013 Open angle with borderline findings, low risk, bilateral: Secondary | ICD-10-CM | POA: Diagnosis not present

## 2018-05-08 DIAGNOSIS — Z961 Presence of intraocular lens: Secondary | ICD-10-CM | POA: Diagnosis not present

## 2018-05-08 DIAGNOSIS — H1045 Other chronic allergic conjunctivitis: Secondary | ICD-10-CM | POA: Diagnosis not present

## 2018-05-15 DIAGNOSIS — M13861 Other specified arthritis, right knee: Secondary | ICD-10-CM | POA: Diagnosis not present

## 2018-05-15 DIAGNOSIS — M25512 Pain in left shoulder: Secondary | ICD-10-CM | POA: Diagnosis not present

## 2018-05-15 DIAGNOSIS — M13862 Other specified arthritis, left knee: Secondary | ICD-10-CM | POA: Diagnosis not present

## 2018-05-28 DIAGNOSIS — E7849 Other hyperlipidemia: Secondary | ICD-10-CM | POA: Diagnosis not present

## 2018-05-28 DIAGNOSIS — R82998 Other abnormal findings in urine: Secondary | ICD-10-CM | POA: Diagnosis not present

## 2018-05-28 DIAGNOSIS — I10 Essential (primary) hypertension: Secondary | ICD-10-CM | POA: Diagnosis not present

## 2018-05-28 DIAGNOSIS — M859 Disorder of bone density and structure, unspecified: Secondary | ICD-10-CM | POA: Diagnosis not present

## 2018-05-28 DIAGNOSIS — E038 Other specified hypothyroidism: Secondary | ICD-10-CM | POA: Diagnosis not present

## 2018-06-04 DIAGNOSIS — Z1331 Encounter for screening for depression: Secondary | ICD-10-CM | POA: Diagnosis not present

## 2018-06-04 DIAGNOSIS — I10 Essential (primary) hypertension: Secondary | ICD-10-CM | POA: Diagnosis not present

## 2018-06-04 DIAGNOSIS — Z0000 Encounter for general adult medical examination without abnormal findings: Secondary | ICD-10-CM | POA: Diagnosis not present

## 2018-06-04 DIAGNOSIS — Z6827 Body mass index (BMI) 27.0-27.9, adult: Secondary | ICD-10-CM | POA: Diagnosis not present

## 2018-06-04 DIAGNOSIS — Z1212 Encounter for screening for malignant neoplasm of rectum: Secondary | ICD-10-CM | POA: Diagnosis not present

## 2018-06-04 DIAGNOSIS — R350 Frequency of micturition: Secondary | ICD-10-CM | POA: Diagnosis not present

## 2018-06-04 DIAGNOSIS — R030 Elevated blood-pressure reading, without diagnosis of hypertension: Secondary | ICD-10-CM | POA: Diagnosis not present

## 2018-06-04 DIAGNOSIS — I4891 Unspecified atrial fibrillation: Secondary | ICD-10-CM | POA: Diagnosis not present

## 2018-06-04 DIAGNOSIS — D692 Other nonthrombocytopenic purpura: Secondary | ICD-10-CM | POA: Diagnosis not present

## 2018-06-04 DIAGNOSIS — G4733 Obstructive sleep apnea (adult) (pediatric): Secondary | ICD-10-CM | POA: Diagnosis not present

## 2018-06-04 DIAGNOSIS — M545 Low back pain: Secondary | ICD-10-CM | POA: Diagnosis not present

## 2018-06-04 DIAGNOSIS — R4589 Other symptoms and signs involving emotional state: Secondary | ICD-10-CM | POA: Diagnosis not present

## 2018-06-04 DIAGNOSIS — I5022 Chronic systolic (congestive) heart failure: Secondary | ICD-10-CM | POA: Diagnosis not present

## 2018-06-18 ENCOUNTER — Telehealth: Payer: Self-pay | Admitting: *Deleted

## 2018-06-18 NOTE — Telephone Encounter (Signed)
-----   Message from Sueanne Margarita, MD sent at 06/17/2018 11:42 PM EDT ----- Good AHI and compliance.  Continue current PAP settings.

## 2018-06-18 NOTE — Telephone Encounter (Signed)
Informed patient of compliance results and patient understanding was verbalized. Patient is aware and agreeable to AHI being within range at 2.2. Patient is aware and agreeable to being in compliance with machine usage. Patient is aware and agreeable to no change in current pressures.

## 2018-07-17 ENCOUNTER — Telehealth: Payer: Self-pay

## 2018-07-17 NOTE — Telephone Encounter (Signed)
Virtual Visit Pre-Appointment Phone Call  Steps For Call: Susan Bell PHONE   1. Confirm consent - "In the setting of the current Covid19 crisis, you are scheduled for a (phone or video) visit with your provider on (date) at (time).  Just as we do with many in-office visits, in order for you to participate in this visit, we must obtain consent.  If you'd like, I can send this to your mychart (if signed up) or email for you to review.  Otherwise, I can obtain your verbal consent now.  All virtual visits are billed to your insurance company just like a normal visit would be.  By agreeing to a virtual visit, we'd like you to understand that the technology does not allow for your provider to perform an examination, and thus may limit your provider's ability to fully assess your condition.  Finally, though the technology is pretty good, we cannot assure that it will always work on either your or our end, and in the setting of a video visit, we may have to convert it to a phone-only visit.  In either situation, we cannot ensure that we have a secure connection.  Are you willing to proceed?" STAFF: Did the patient verbally acknowledge consent to telehealth visit? Document YES/NO  2. Confirm the BEST phone number to call the day of the visit:   3. Give patient instructions for WebEx/MyChart download to smartphone as below or Doximity/Doxy.me if video visit (depending on what platform provider is using)  4. Advise patient to be prepared with any vital sign or heart rhythm information, their current medicines, and a piece of paper and pen handy for any instructions they may receive the day of their visit  5. Inform patient they will receive a phone call 15 minutes prior to their appointment time (may be from unknown caller ID) so they should be prepared to answer  6. Confirm that appointment type is correct in Epic appointment notes (video vs telephone)     TELEPHONE CALL NOTE  [name] has been deemed a candidate for a follow-up tele-health visit to limit community exposure during the Covid-19 pandemic. I spoke with the patient via phone to ensure availability of phone/video source, confirm preferred email & phone number, and discuss instructions and expectations.  I reminded Susan Bell to be prepared with any vital sign and/or heart rhythm information that could potentially be obtained via home monitoring, at the time of her visit. I reminded Susan Bell to expect a phone call at the time of her visit if her visit.  Zebedee Iba, CMA 07/17/2018 1:33 PM   DOWNLOADING THE WEBEX APP TO SMARTPHONE  - If Apple, ask patient to go to [employer] and type in WebEx in the search bar. [place] [employer], the blue/[place] circle. If Android, go to [employer] and type in [employer] in the search bar. The app is free but as with any other app downloads, their phone may require them to verify saved payment information or Apple/Android password.  - The patient does NOT have to create an account. - On the day of the visit, the assist will walk the patient through joining the meeting with the meeting number/password.  DOWNLOADING THE MYCHART APP TO SMARTPHONE  - If Apple, go to [employer] and type in MyChart in the search bar and download the app. If Android, ask patient to go to [employer] and type in [employer]  in the search bar and download the app. The app is free but as with any other app downloads, their phone may require them to verify saved payment information or Apple/Android password.  - The patient will need to then log into the app with their MyChart username and password, and select [facility] as their healthcare provider to link the account. When it is time for your visit, go to the MyChart app, find appointments, and click Begin Video Visit. Be sure to Select Allow for your device to access the Microphone and Camera for your visit. You will then  be connected, and your provider will be with you shortly.  **If they have any issues connecting, or need assistance please contact [facility] [phone])  **If using a computer, in order to ensure the best quality for your visit they will need to use either of the following Internet Browsers: [employer] [place], or [place]   I hereby voluntarily request, consent and authorize [facility] and its employed or contracted physicians, physician assistants, nurse practitioners or other licensed health care professionals (the Practitioner), to provide me with telemedicine health care services (the Services") as deemed necessary by the treating Practitioner. I acknowledge and consent to receive the Services by the Practitioner via telemedicine. I understand that the telemedicine visit will involve communicating with the Practitioner through live audiovisual communication technology and the disclosure of certain medical information by electronic transmission. I acknowledge that I have been given the opportunity to request an in-person assessment or other available alternative prior to the telemedicine visit and am voluntarily participating in the telemedicine visit.  I understand that I have the right to withhold or withdraw my consent to the use of telemedicine in the course of my care at any time, without affecting my right to future care or treatment, and that the Practitioner or I may terminate the telemedicine visit at any time. I understand that I have the right to inspect all information obtained and/or recorded in the course of the telemedicine visit and may receive copies of available information for a reasonable fee.  I understand that some of the potential risks of receiving the Services via telemedicine include:   Delay or interruption in medical evaluation due to technological equipment failure or disruption;  Information  transmitted may not be sufficient (e.g. poor resolution of images) to allow for appropriate medical decision making by the Practitioner; and/or   In rare instances, security protocols could fail, causing a breach of personal health information.  Furthermore, I acknowledge that it is my responsibility to provide information about my medical history, conditions and care that is complete and accurate to the best of my ability. I acknowledge that Practitioner's advice, recommendations, and/or decision may be based on factors not within their control, such as incomplete or inaccurate data provided by me or distortions of diagnostic images or specimens that may result from electronic transmissions. I understand that the practice of medicine is not an exact science and that Practitioner makes no warranties or guarantees regarding treatment outcomes. I acknowledge that I will receive a copy of this consent concurrently upon execution via email to the email address I last provided but may also request a printed copy by calling the office of [facility].    I understand that my insurance will be billed for this visit.   I have read or had this consent read to me.  I understand the contents of this  consent, which adequately explains the benefits and risks of the Services being provided via telemedicine.   I have been provided ample opportunity to ask questions regarding this consent and the Services and have had my questions answered to my satisfaction.  I give my informed consent for the services to be provided through the use of telemedicine in my medical care  By participating in this telemedicine visit I agree to the above.

## 2018-07-20 ENCOUNTER — Telehealth: Payer: Self-pay | Admitting: Cardiology

## 2018-07-20 NOTE — Telephone Encounter (Signed)
Mychart link sent via email, smartphone, pre reg complete 07/20/18 AF

## 2018-07-22 NOTE — Progress Notes (Signed)
Virtual Visit via Video Note   This visit type was conducted due to national recommendations for restrictions regarding the COVID-19 Pandemic (e.g. social distancing) in an effort to limit this patient's exposure and mitigate transmission in our community.  Due to her co-morbid illnesses, this patient is at least at moderate risk for complications without adequate follow up.  This format is felt to be most appropriate for this patient at this time.  All issues noted in this document were discussed and addressed.  A limited physical exam was performed with this format.  Please refer to the patient's chart for her consent to telehealth for [facility].   Evaluation Performed:  Follow-up visit  Date:  07/23/2018   ID:  Susan Bell, DOB Dec 19, 1940, MRN [id number]  Patient Location: Home Provider Location: Home  PCP:  Shon Baton, MD  Cardiologist:  Minus Breeding, MD  Electrophysiologist:  None   Chief Complaint:  Atrial fib  History of Present Illness:    Susan Bell is a 78 y.o. female with for follow up of atrial fib.  She has had persistent atrial fib requiring cardioversion and was on Tikosyn because of recurrence after cardioversion. However, she had recurrence of fib on this and long QT and was taken off of this.   She was seen in the atrial fib clinic.     She says that since I last saw her she has continued to have some tachypalpitations.  She believes it to be the atrial fibrillation.  She was taken off of the Tikosyn in August.  I do notice that she was in sinus rhythm on the last 2 EKGs in October and November.  She denies any chest pressure, neck or arm discomfort.  She is had no presyncope or syncope.  She has no PND or orthopnea.  She has no new shortness of breath.  She is had no weight gain or edema.  The patient does not have symptoms concerning for COVID-19 infection (fever, chills, cough, or new shortness of breath).    Past Medical History:  Diagnosis  Date  . Allergy    seasonal  . Anemia   . Arthritis   . Cataract    bilateral removed  . Celiac disease   . Eczema   . H/O transfusion of platelets   . Hyperlipidemia   . Hypothyroidism   . OSA (obstructive sleep apnea)   . Osteopenia   . Paroxysmal atrial fibrillation (HCC)   . Shingles   . Vitamin D deficiency    Past Surgical History:  Procedure Laterality Date  . CARDIOVERSION N/A 12/14/2017   Procedure: CARDIOVERSION;  Surgeon: Sanda Klein, MD;  [facility];  Service: Cardiovascular;  Laterality: N/A;  . CATARACT EXTRACTION     bilateral  . COLONOSCOPY    . DILATION AND CURETTAGE OF UTERUS    . UPPER GI ENDOSCOPY       Current Meds  Medication Sig  . alendronate (FOSAMAX) 70 MG tablet Take 70 mg by mouth every Tuesday.   Marland Kitchen amLODipine (NORVASC) 5 MG tablet Take 1 tablet (5 mg total) by mouth daily.  Marland Kitchen apixaban (ELIQUIS) 5 MG TABS tablet Take 1 tablet (5 mg total) by mouth 2 (two) times daily.  . Calcium Carbonate-Vitamin D (CALCIUM 600+D PO) Take 1 tablet by mouth daily at 12 noon.  . cholecalciferol (VITAMIN D) 400 units TABS tablet Take 400 Units by mouth daily at 12 noon.  . Ferrous Sulfate (SLOW FE PO) Take  45 mg by mouth every Monday, Wednesday, and Friday.   . levothyroxine (LEVOTHROID) 88 MCG tablet Take 88 mcg by mouth daily.    Marland Kitchen loratadine (CLARITIN) 10 MG tablet Take 10 mg by mouth every evening.   Marland Kitchen losartan (COZAAR) 50 MG tablet Take 0.5 tablets (25 mg total) by mouth daily.  Marland Kitchen MELATONIN PO Take 2 mg by mouth daily as needed (sleep).  . metoprolol succinate (TOPROL-XL) 50 MG 24 hr tablet Take 1 tablet (50 mg total) by mouth daily. Take with or immediately following a meal.  . Multiple Vitamins-Minerals (ADULT ONE DAILY GUMMIES) CHEW Chew 2 tablets by mouth daily.  Marland Kitchen olopatadine (PATANOL) 0.1 % ophthalmic solution Place 1 drop into both eyes as needed for allergies.   Marland Kitchen omeprazole (PRILOSEC) 20 MG capsule Take 20 mg by mouth at bedtime.  Marland Kitchen  oxybutynin (DITROPAN) 5 MG tablet Take 2.5 mg every evening by mouth.  . rosuvastatin (CRESTOR) 5 MG tablet Take 5 mg by mouth every evening.      Allergies:   Betadine [povidone iodine]   Social History   Tobacco Use  . Smoking status: Never Smoker  . Smokeless tobacco: Never Used  Substance Use Topics  . Alcohol use: No    Alcohol/week: 0.0 standard drinks  . Drug use: No     Family Hx: The patient's family history includes Diabetes in her father; Heart disease in her father; Liver cancer in her mother. There is no history of Colon cancer.  ROS:   Please see the history of present illness.    As stated in the HPI and negative for all other systems.   Prior CV studies:   The following studies were reviewed today:  EKGs, Atrial Fib Clinic  Labs/Other Tests and Data Reviewed:    EKG:  NSR on EKG 11/1 and 10/4  Recent Labs: 10/26/2017: Hemoglobin 10.5; Platelets 374; TSH 0.468 12/25/2017: Magnesium 2.0 03/08/2018: BUN 12; Creatinine, Ser 0.97; Potassium 4.0; Sodium 132   Recent Lipid Panel No results found for: CHOL, TRIG, HDL, CHOLHDL, LDLCALC, LDLDIRECT  Wt Readings from Last 3 Encounters:  07/23/18 163 lb (73.9 kg)  04/09/18 166 lb (75.3 kg)  03/08/18 164 lb 9.6 oz (74.7 kg)     Objective:    Vital Signs:  BP 138/60   Pulse (!) 57   Ht 5' 5"  (1.651 m)   Wt 163 lb (73.9 kg)   BMI 27.12 kg/m    VITAL SIGNS:  reviewed GEN:  no acute distress EYES:  sclerae anicteric, EOMI - Extraocular Movements Intact NEURO:  alert and oriented x 3, no obvious focal deficit PSYCH:  normal affect  ASSESSMENT & PLAN:    ATRIAL FIB:     [name] has a CHA2DS2 - VASc score of 3.  We had a long discussion about this.  She is going to get an Alive Cor device.  She is been recording symptoms that she has.  If indeed this is symptomatic atrial fibrillation then likely she would need amiodarone or possibly an ablation.  For now she is going to continue the meds as  listed.  DYSLIPIDEMIA: Per Dr. Virgina Jock.  CHRONIC SYSTOLIC HF:    Her EF is 35%.  I thought that this was likely related to her fibrillation which seems to be better controlled.  I am getting get an assessment above as listed.  I will continue to titrate her medications but will follow-up with an echocardiogram when I see her back in  3 months.  Her appointment must be in person and in 3 months.  Symptomatically she seems to be euvolemic.  OSA:  This is being managed by Dr. Radford Pax.  She is now having this treated.  COVID-19 Education: The signs and symptoms of COVID-19 were discussed with the patient and how to seek care for testing (follow up with PCP or arrange E-visit).  The importance of social distancing was discussed today.  Time:   Today, I have spent 20 minutes with the patient with telehealth technology discussing the above problems.     Medication Adjustments/Labs and Tests Ordered: Current medicines are reviewed at length with the patient today.  Concerns regarding medicines are outlined above.   Tests Ordered: No orders of the defined types were placed in this encounter.   Medication Changes: No orders of the defined types were placed in this encounter.   Disposition:  Follow up 3 months in the office  Signed, Minus Breeding, MD  07/23/2018 11:32 AM    [place]

## 2018-07-23 ENCOUNTER — Encounter: Payer: Self-pay | Admitting: Cardiology

## 2018-07-23 ENCOUNTER — Telehealth (INDEPENDENT_AMBULATORY_CARE_PROVIDER_SITE_OTHER): Payer: Medicare Other | Admitting: Cardiology

## 2018-07-23 VITALS — BP 138/60 | HR 57 | Ht 65.0 in | Wt 163.0 lb

## 2018-07-23 DIAGNOSIS — Z7189 Other specified counseling: Secondary | ICD-10-CM | POA: Diagnosis not present

## 2018-07-23 DIAGNOSIS — I319 Disease of pericardium, unspecified: Secondary | ICD-10-CM

## 2018-07-23 DIAGNOSIS — I10 Essential (primary) hypertension: Secondary | ICD-10-CM | POA: Insufficient documentation

## 2018-07-23 NOTE — Patient Instructions (Signed)
Medication Instructions:  Continue current medications  If you need a refill on your cardiac medications before your next appointment, please call your pharmacy.  Labwork: None Ordered   Testing/Procedures: None Ordered   Follow-Up: You will need a follow up appointment in 3 months.  Please call our office 2 months in advance to schedule this appointment.  You may see Minus Breeding, MD or one of the following Advanced Practice Providers on your designated Care Team:   Rosaria Ferries, PA-C . Jory Sims, DNP, ANP     At [facility], you and your health needs are our priority.  As part of our continuing mission to provide you with exceptional heart care, we have created designated Provider Care Teams.  These Care Teams include your primary Cardiologist (physician) and Advanced Practice Providers (APPs -  Physician Assistants and Nurse Practitioners) who all work together to provide you with the care you need, when you need it.  Thank you for choosing [facility] at [facility]!!

## 2018-08-28 DIAGNOSIS — M170 Bilateral primary osteoarthritis of knee: Secondary | ICD-10-CM | POA: Insufficient documentation

## 2018-08-29 DIAGNOSIS — M170 Bilateral primary osteoarthritis of knee: Secondary | ICD-10-CM | POA: Diagnosis not present

## 2018-09-25 DIAGNOSIS — H0102A Squamous blepharitis right eye, upper and lower eyelids: Secondary | ICD-10-CM | POA: Diagnosis not present

## 2018-09-25 DIAGNOSIS — H1851 Endothelial corneal dystrophy: Secondary | ICD-10-CM | POA: Diagnosis not present

## 2018-09-25 DIAGNOSIS — H0102B Squamous blepharitis left eye, upper and lower eyelids: Secondary | ICD-10-CM | POA: Diagnosis not present

## 2018-09-25 DIAGNOSIS — H1045 Other chronic allergic conjunctivitis: Secondary | ICD-10-CM | POA: Diagnosis not present

## 2018-09-25 DIAGNOSIS — Z961 Presence of intraocular lens: Secondary | ICD-10-CM | POA: Diagnosis not present

## 2018-10-18 ENCOUNTER — Telehealth: Payer: Self-pay | Admitting: Cardiology

## 2018-10-18 NOTE — Progress Notes (Signed)
Cardiology Office Note   Date:  10/19/2018   ID:  Susan Bell, DOB Jun 24, 1940, MRN [id number]  PCP:  Shon Baton, MD  Cardiologist:   Minus Breeding, MD   Chief Complaint  Patient presents with  . Atrial Fibrillation      History of Present Illness: Susan Bell is a 78 y.o. female who presents for follow up of atrial fib. She has had persistent atrial fib requiring cardioversion and was on Tikosyn because of recurrence after cardioversion. However, she had recurrence of fib on this and long QT and was taken off of this.  She was seen in the atrial fib clinic. She thinks that she has had atrial fib with symptoms but EKGs have been NSR.  She was going to get an Alive Cor to see if she was having symptomatic atrial fib.    She thinks she is doing much better.  She is not having any tachypalpitations.  She has not had anything recorded on her Alive Core.  She denies any palpitations, presyncope or syncope.  She said no chest pressure, neck or arm discomfort.  She is had no weight gain or edema.   Past Medical History:  Diagnosis Date  . Allergy    seasonal  . Anemia   . Arthritis   . Cataract    bilateral removed  . Celiac disease   . Eczema   . H/O transfusion of platelets   . Hyperlipidemia   . Hypothyroidism   . OSA (obstructive sleep apnea)   . Osteopenia   . Paroxysmal atrial fibrillation (HCC)   . Shingles   . Vitamin D deficiency     Past Surgical History:  Procedure Laterality Date  . CARDIOVERSION N/A 12/14/2017   Procedure: CARDIOVERSION;  Surgeon: Sanda Klein, MD;  Location: [facility];  Service: Cardiovascular;  Laterality: N/A;  . CATARACT EXTRACTION     bilateral  . COLONOSCOPY    . DILATION AND CURETTAGE OF UTERUS    . UPPER GI ENDOSCOPY       Current Outpatient Medications  Medication Sig Dispense Refill  . alendronate (FOSAMAX) 70 MG tablet Take 70 mg by mouth every Tuesday.     Marland Kitchen amLODipine (NORVASC) 5 MG tablet Take 1  tablet (5 mg total) by mouth daily. 90 tablet 2  . apixaban (ELIQUIS) 5 MG TABS tablet Take 1 tablet (5 mg total) by mouth 2 (two) times daily. 180 tablet 2  . Calcium Carbonate-Vitamin D (CALCIUM 600+D PO) Take 1 tablet by mouth daily at 12 noon.    . cholecalciferol (VITAMIN D) 400 units TABS tablet Take 400 Units by mouth daily at 12 noon.    . Ferrous Sulfate (SLOW FE PO) Take 45 mg by mouth every Monday, Wednesday, and Friday.     . levothyroxine (LEVOTHROID) 88 MCG tablet Take 88 mcg by mouth daily.      Marland Kitchen loratadine (CLARITIN) 10 MG tablet Take 10 mg by mouth every evening.     Marland Kitchen losartan (COZAAR) 50 MG tablet Take 1 tablet (50 mg total) by mouth daily. 90 tablet 3  . MELATONIN PO Take 2 mg by mouth daily as needed (sleep).    . metoprolol succinate (TOPROL-XL) 50 MG 24 hr tablet Take 1 tablet (50 mg total) by mouth daily. Take with or immediately following a meal. 90 tablet 2  . Multiple Vitamins-Minerals (ADULT ONE DAILY GUMMIES) CHEW Chew 2 tablets by mouth daily.    Marland Kitchen olopatadine (PATANOL) 0.1 %  ophthalmic solution Place 1 drop into both eyes as needed for allergies.     Marland Kitchen omeprazole (PRILOSEC) 20 MG capsule Take 20 mg by mouth at bedtime.    Marland Kitchen oxybutynin (DITROPAN) 5 MG tablet Take 2.5 mg every evening by mouth.    . rosuvastatin (CRESTOR) 5 MG tablet Take 5 mg by mouth every evening.   3  . traZODone (DESYREL) 50 MG tablet Take 50 mg by mouth at bedtime.     No current facility-administered medications for this visit.     Allergies:   Betadine [povidone iodine]    ROS:  Please see the history of present illness.   Otherwise, review of systems are positive for none.   All other systems are reviewed and negative.    PHYSICAL EXAM: VS:  BP (!) 150/80   Pulse (!) 58   Ht 5' 6.5" (1.689 m)   Wt 173 lb 3.2 oz (78.6 kg)   BMI 27.54 kg/m  , BMI Body mass index is 27.54 kg/m. GENERAL:  Well appearing NECK:  No jugular venous distention, waveform within normal limits, carotid  upstroke brisk and symmetric, no bruits, no thyromegaly LUNGS:  Clear to auscultation bilaterally CHEST:  Unremarkable HEART:  PMI not displaced or sustained,S1 and S2 within normal limits, no S3, no S4, no clicks, no rubs, no murmurs ABD:  Flat, positive bowel sounds normal in frequency in pitch, no bruits, no rebound, no guarding, no midline pulsatile mass, no hepatomegaly, no splenomegaly EXT:  2 plus pulses throughout, no edema, no cyanosis no clubbing   EKG:  EKG is not ordered today.    Recent Labs: 10/26/2017: Hemoglobin 10.5; Platelets 374; TSH 0.468 12/25/2017: Magnesium 2.0 03/08/2018: BUN 12; Creatinine, Ser 0.97; Potassium 4.0; Sodium 132    Lipid Panel No results found for: CHOL, TRIG, HDL, CHOLHDL, VLDL, LDLCALC, LDLDIRECT    Wt Readings from Last 3 Encounters:  10/19/18 173 lb 3.2 oz (78.6 kg)  07/23/18 163 lb (73.9 kg)  04/09/18 166 lb (75.3 kg)      Other studies Reviewed: Additional studies/ records that were reviewed today include: None. Review of the above records demonstrates:  Please see elsewhere in the note.     ASSESSMENT AND PLAN:  ATRIAL FIB:     [name] has a CHA2DS2 - VASc score of 3.   Tolerating anticoagulation has had no symptomatic paroxysms.  No change in therapy.  DYSLIPIDEMIA:      This is followed by  Dr. Virgina Jock.  CHRONIC SYSTOLIC HF:    Her EF is 35%.    Am going to repeat an echocardiogram.  I will titrate her meds as below.   OSA:  This is being managed by Dr. Radford Pax.    HTN: Blood pressure is elevated.  I did increase her Cozaar to 50 mg daily.   Current medicines are reviewed at length with the patient today.  The patient does not have concerns regarding medicines.  The following changes have been made:  As above  Labs/ tests ordered today include: None  Orders Placed This Encounter  Procedures  . ECHOCARDIOGRAM COMPLETE     Disposition:   FU with me in six months virtual is OK.     Signed, Minus Breeding, MD  10/19/2018 2:48 PM    [facility]

## 2018-10-18 NOTE — Telephone Encounter (Signed)

## 2018-10-19 ENCOUNTER — Encounter: Payer: Self-pay | Admitting: Cardiology

## 2018-10-19 ENCOUNTER — Ambulatory Visit (INDEPENDENT_AMBULATORY_CARE_PROVIDER_SITE_OTHER): Payer: Medicare Other | Admitting: Cardiology

## 2018-10-19 ENCOUNTER — Other Ambulatory Visit: Payer: Self-pay

## 2018-10-19 VITALS — BP 150/80 | HR 58 | Ht 66.5 in | Wt 173.2 lb

## 2018-10-19 DIAGNOSIS — I10 Essential (primary) hypertension: Secondary | ICD-10-CM

## 2018-10-19 DIAGNOSIS — I480 Paroxysmal atrial fibrillation: Secondary | ICD-10-CM | POA: Diagnosis not present

## 2018-10-19 DIAGNOSIS — I429 Cardiomyopathy, unspecified: Secondary | ICD-10-CM | POA: Diagnosis not present

## 2018-10-19 MED ORDER — LOSARTAN POTASSIUM 50 MG PO TABS: 50.0000 mg | ORAL_TABLET | Freq: Every day | ORAL | 3 refills | Status: DC

## 2018-10-19 NOTE — Patient Instructions (Addendum)
Medication Instructions:  INCREASE YOUR LOSARTAN TO 50 MG DAILY   If you need a refill on your cardiac medications before your next appointment, please call your pharmacy.   Lab work: NONE   Testing/Procedures: Your physician has requested that you have an echocardiogram. Echocardiography is a painless test that uses sound waves to create images of your heart. It provides your doctor with information about the size and shape of your heart and how well your heart's chambers and valves are working. This procedure takes approximately one hour. There are no restrictions for this procedure. [address]  Follow-Up: At [facility], you and your health needs are our priority.  As part of our continuing mission to provide you with exceptional heart care, we have created designated Provider Care Teams.  These Care Teams include your primary Cardiologist (physician) and Advanced Practice Providers (APPs -  Physician Assistants and Nurse Practitioners) who all work together to provide you with the care you need, when you need it. You will need a follow up appointment in 6 months.  Please call our office 2 months in advance to schedule this appointment.  You may see Minus Breeding, MD or one of the following Advanced Practice Providers on your designated Care Team:   Rosaria Ferries, PA-C . Jory Sims, DNP, ANP  Any Other Special Instructions Will Be Listed Below (If Applicable).   Echocardiogram An echocardiogram is a procedure that uses painless sound waves (ultrasound) to produce an image of the heart. Images from an echocardiogram can provide important information about:  Signs of coronary artery disease (CAD).  Aneurysm detection. An aneurysm is a weak or damaged part of an artery wall that bulges out from the normal force of blood pumping through the body.  Heart size and shape. Changes in the size or shape of the heart can be associated with certain  conditions, including heart failure, aneurysm, and CAD.  Heart muscle function.  Heart valve function.  Signs of a past heart attack.  Fluid buildup around the heart.  Thickening of the heart muscle.  A tumor or infectious growth around the heart valves. Tell a health care provider about:  Any allergies you have.  All medicines you are taking, including vitamins, herbs, eye drops, creams, and over-the-counter medicines.  Any blood disorders you have.  Any surgeries you have had.  Any medical conditions you have.  Whether you are pregnant or may be pregnant. What are the risks? Generally, this is a safe procedure. However, problems may occur, including:  Allergic reaction to dye (contrast) that may be used during the procedure. What happens before the procedure? No specific preparation is needed. You may eat and drink normally. What happens during the procedure?   An IV tube may be inserted into one of your veins.  You may receive contrast through this tube. A contrast is an injection that improves the quality of the pictures from your heart.  A gel will be applied to your chest.  A wand-like tool (transducer) will be moved over your chest. The gel will help to transmit the sound waves from the transducer.  The sound waves will harmlessly bounce off of your heart to allow the heart images to be captured in real-time motion. The images will be recorded on a computer. The procedure may vary among health care providers and hospitals. What happens after the procedure?  You may return to your normal, everyday life, including diet, activities, and medicines,  unless your health care provider tells you not to do that. Summary  An echocardiogram is a procedure that uses painless sound waves (ultrasound) to produce an image of the heart.  Images from an echocardiogram can provide important information about the size and shape of your heart, heart muscle function, heart valve  function, and fluid buildup around your heart.  You do not need to do anything to prepare before this procedure. You may eat and drink normally.  After the echocardiogram is completed, you may return to your normal, everyday life, unless your health care provider tells you not to do that. This information is not intended to replace advice given to you by your health care provider. Make sure you discuss any questions you have with your health care provider. Document Released: 03/18/2000 Document Revised: 07/12/2018 Document Reviewed: 04/23/2016 Elsevier Patient Education  2020 [employer].

## 2018-10-24 ENCOUNTER — Other Ambulatory Visit: Payer: Self-pay

## 2018-10-24 ENCOUNTER — Ambulatory Visit (HOSPITAL_COMMUNITY): Payer: Medicare Other | Attending: Cardiovascular Disease

## 2018-10-24 DIAGNOSIS — I10 Essential (primary) hypertension: Secondary | ICD-10-CM | POA: Diagnosis not present

## 2018-10-24 DIAGNOSIS — I429 Cardiomyopathy, unspecified: Secondary | ICD-10-CM | POA: Diagnosis not present

## 2018-10-26 DIAGNOSIS — Z1231 Encounter for screening mammogram for malignant neoplasm of breast: Secondary | ICD-10-CM | POA: Diagnosis not present

## 2018-11-05 DIAGNOSIS — H1045 Other chronic allergic conjunctivitis: Secondary | ICD-10-CM | POA: Diagnosis not present

## 2018-11-05 DIAGNOSIS — H0102B Squamous blepharitis left eye, upper and lower eyelids: Secondary | ICD-10-CM | POA: Diagnosis not present

## 2018-11-05 DIAGNOSIS — Z961 Presence of intraocular lens: Secondary | ICD-10-CM | POA: Diagnosis not present

## 2018-11-05 DIAGNOSIS — H0102A Squamous blepharitis right eye, upper and lower eyelids: Secondary | ICD-10-CM | POA: Diagnosis not present

## 2018-11-05 DIAGNOSIS — H1851 Endothelial corneal dystrophy: Secondary | ICD-10-CM | POA: Diagnosis not present

## 2018-11-20 DIAGNOSIS — M545 Low back pain: Secondary | ICD-10-CM | POA: Diagnosis not present

## 2018-11-20 DIAGNOSIS — M170 Bilateral primary osteoarthritis of knee: Secondary | ICD-10-CM | POA: Diagnosis not present

## 2018-11-30 DIAGNOSIS — Z23 Encounter for immunization: Secondary | ICD-10-CM | POA: Diagnosis not present

## 2018-12-03 DIAGNOSIS — G4733 Obstructive sleep apnea (adult) (pediatric): Secondary | ICD-10-CM | POA: Diagnosis not present

## 2018-12-03 DIAGNOSIS — I10 Essential (primary) hypertension: Secondary | ICD-10-CM | POA: Diagnosis not present

## 2018-12-03 DIAGNOSIS — R4589 Other symptoms and signs involving emotional state: Secondary | ICD-10-CM | POA: Diagnosis not present

## 2018-12-03 DIAGNOSIS — R600 Localized edema: Secondary | ICD-10-CM | POA: Diagnosis not present

## 2018-12-03 DIAGNOSIS — I4891 Unspecified atrial fibrillation: Secondary | ICD-10-CM | POA: Diagnosis not present

## 2018-12-03 DIAGNOSIS — I5022 Chronic systolic (congestive) heart failure: Secondary | ICD-10-CM | POA: Diagnosis not present

## 2018-12-03 DIAGNOSIS — E039 Hypothyroidism, unspecified: Secondary | ICD-10-CM | POA: Diagnosis not present

## 2018-12-03 DIAGNOSIS — M79676 Pain in unspecified toe(s): Secondary | ICD-10-CM | POA: Diagnosis not present

## 2018-12-03 DIAGNOSIS — F325 Major depressive disorder, single episode, in full remission: Secondary | ICD-10-CM | POA: Diagnosis not present

## 2018-12-25 DIAGNOSIS — D225 Melanocytic nevi of trunk: Secondary | ICD-10-CM | POA: Diagnosis not present

## 2018-12-25 DIAGNOSIS — L820 Inflamed seborrheic keratosis: Secondary | ICD-10-CM | POA: Diagnosis not present

## 2018-12-25 DIAGNOSIS — Z85828 Personal history of other malignant neoplasm of skin: Secondary | ICD-10-CM | POA: Diagnosis not present

## 2018-12-25 DIAGNOSIS — L570 Actinic keratosis: Secondary | ICD-10-CM | POA: Diagnosis not present

## 2018-12-25 DIAGNOSIS — D692 Other nonthrombocytopenic purpura: Secondary | ICD-10-CM | POA: Diagnosis not present

## 2018-12-25 DIAGNOSIS — L821 Other seborrheic keratosis: Secondary | ICD-10-CM | POA: Diagnosis not present

## 2018-12-25 DIAGNOSIS — L814 Other melanin hyperpigmentation: Secondary | ICD-10-CM | POA: Diagnosis not present

## 2018-12-25 DIAGNOSIS — D1801 Hemangioma of skin and subcutaneous tissue: Secondary | ICD-10-CM | POA: Diagnosis not present

## 2019-01-01 ENCOUNTER — Other Ambulatory Visit (HOSPITAL_COMMUNITY): Payer: Self-pay | Admitting: Nurse Practitioner

## 2019-01-02 DIAGNOSIS — H1851 Endothelial corneal dystrophy: Secondary | ICD-10-CM | POA: Diagnosis not present

## 2019-01-14 ENCOUNTER — Other Ambulatory Visit (HOSPITAL_COMMUNITY): Payer: Self-pay | Admitting: Nurse Practitioner

## 2019-01-15 NOTE — Telephone Encounter (Signed)
36f 78.6kg Scr 0.97 Lovw/hochrein 10/19/18

## 2019-01-30 DIAGNOSIS — M170 Bilateral primary osteoarthritis of knee: Secondary | ICD-10-CM | POA: Diagnosis not present

## 2019-02-07 DIAGNOSIS — H18519 Endothelial corneal dystrophy, unspecified eye: Secondary | ICD-10-CM | POA: Diagnosis not present

## 2019-02-08 DIAGNOSIS — H18519 Endothelial corneal dystrophy, unspecified eye: Secondary | ICD-10-CM | POA: Diagnosis not present

## 2019-02-08 DIAGNOSIS — Z20828 Contact with and (suspected) exposure to other viral communicable diseases: Secondary | ICD-10-CM | POA: Diagnosis not present

## 2019-02-11 DIAGNOSIS — H18519 Endothelial corneal dystrophy, unspecified eye: Secondary | ICD-10-CM | POA: Diagnosis not present

## 2019-02-11 DIAGNOSIS — H18512 Endothelial corneal dystrophy, left eye: Secondary | ICD-10-CM | POA: Diagnosis not present

## 2019-02-25 ENCOUNTER — Other Ambulatory Visit: Payer: Self-pay

## 2019-02-25 DIAGNOSIS — Z20822 Contact with and (suspected) exposure to covid-19: Secondary | ICD-10-CM

## 2019-02-25 DIAGNOSIS — Z20828 Contact with and (suspected) exposure to other viral communicable diseases: Secondary | ICD-10-CM | POA: Diagnosis not present

## 2019-02-27 LAB — NOVEL CORONAVIRUS, NAA: SARS-CoV-2, NAA: NOT DETECTED

## 2019-04-16 ENCOUNTER — Ambulatory Visit: Payer: Medicare Other | Attending: Internal Medicine

## 2019-04-16 DIAGNOSIS — Z23 Encounter for immunization: Secondary | ICD-10-CM | POA: Diagnosis not present

## 2019-04-16 NOTE — Progress Notes (Signed)
   Covid-19 Vaccination Clinic  Name:  Susan Bell    MRN: [id number] DOB: 1940/12/11  04/16/2019  [name] was observed post Covid-19 immunization for 30 minutes based on pre-vaccination screening without incidence. She was provided with Vaccine Information Sheet and instruction to access the V-Safe system.   [name] was instructed to call 911 with any severe reactions post vaccine: Marland Kitchen Difficulty breathing  . Swelling of your face and throat  . A fast heartbeat  . A bad rash all over your body  . Dizziness and weakness    Immunizations Administered    Name Date Dose VIS Date Route   Pfizer COVID-19 Vaccine 04/16/2019  9:09 AM 0.3 mL 03/15/2019 Intramuscular   Manufacturer: [place]   Lot: [id number]   [place]: [id number]

## 2019-04-18 ENCOUNTER — Ambulatory Visit: Payer: Medicare Other | Admitting: Cardiology

## 2019-04-28 DIAGNOSIS — I5022 Chronic systolic (congestive) heart failure: Secondary | ICD-10-CM | POA: Insufficient documentation

## 2019-04-28 DIAGNOSIS — E785 Hyperlipidemia, unspecified: Secondary | ICD-10-CM | POA: Insufficient documentation

## 2019-04-28 NOTE — Progress Notes (Signed)
Cardiology Office Note   Date:  04/29/2019   ID:  Susan Bell, DOB 06-01-1940, MRN [id number]  PCP:  Susan Baton, MD  Cardiologist:   Susan Breeding, MD   Chief Complaint  Patient presents with  . Atrial Fibrillation      History of Present Illness: Susan Bell is a 79 y.o. female who presents for follow up of atrial fib. She has had persistent atrial fib requiring cardioversion and was on Tikosyn because of recurrence after cardioversion. However, she had recurrence of fib on this and long QT and was taken off of this.    She returns for follow up.  She is in sinus.  She thought maybe a couple of times she was in fibrillation on her Alive cor.  However, when she checked it sinus rhythm.  She might feel funny sensation.  She otherwise is doing okay.  She denies any cardiovascular symptoms such as chest pressure, neck or arm discomfort.  She has had no new shortness of breath, PND or orthopnea.  She feels weak in her edema.   Past Medical History:  Diagnosis Date  . Allergy    seasonal  . Anemia   . Arthritis   . Cataract    bilateral removed  . Celiac disease   . Eczema   . H/O transfusion of platelets   . Hyperlipidemia   . Hypothyroidism   . OSA (obstructive sleep apnea)   . Osteopenia   . Paroxysmal atrial fibrillation (HCC)   . Shingles   . Vitamin D deficiency     Past Surgical History:  Procedure Laterality Date  . CARDIOVERSION N/A 12/14/2017   Procedure: CARDIOVERSION;  Surgeon: Sanda Klein, MD;  Location: [facility];  Service: Cardiovascular;  Laterality: N/A;  . CATARACT EXTRACTION     bilateral  . COLONOSCOPY    . DILATION AND CURETTAGE OF UTERUS    . UPPER GI ENDOSCOPY       Current Outpatient Medications  Medication Sig Dispense Refill  . alendronate (FOSAMAX) 70 MG tablet Take 70 mg by mouth every Tuesday.     Marland Kitchen amLODipine (NORVASC) 5 MG tablet TAKE 1 TABLET BY MOUTH EVERY DAY 90 tablet 2  . Calcium Carbonate-Vitamin D  (CALCIUM 600+D PO) Take 1 tablet by mouth daily at 12 noon.    . cholecalciferol (VITAMIN D) 400 units TABS tablet Take 400 Units by mouth daily at 12 noon.    Marland Kitchen ELIQUIS 5 MG TABS tablet TAKE 1 TABLET TWICE A DAY 180 tablet 2  . levothyroxine (LEVOTHROID) 88 MCG tablet Take 88 mcg by mouth daily.      Marland Kitchen loratadine (CLARITIN) 10 MG tablet Take 10 mg by mouth every evening.     Marland Kitchen losartan (COZAAR) 50 MG tablet Take 1 tablet (50 mg total) by mouth daily. 90 tablet 3  . MELATONIN PO Take 2 mg by mouth daily as needed (sleep).    . metoprolol succinate (TOPROL-XL) 50 MG 24 hr tablet TAKE 1 TABLET BY MOUTH DAILY. TAKE WITH OR IMMEDIATELY FOLLOWING A MEAL. 90 tablet 2  . Multiple Vitamins-Minerals (ADULT ONE DAILY GUMMIES) CHEW Chew 2 tablets by mouth daily.    Marland Kitchen olopatadine (PATANOL) 0.1 % ophthalmic solution Place 1 drop into both eyes as needed for allergies.     Marland Kitchen omeprazole (PRILOSEC) 20 MG capsule Take 20 mg by mouth at bedtime.    Marland Kitchen oxybutynin (DITROPAN) 5 MG tablet Take 2.5 mg every evening by mouth.    Marland Kitchen  prednisoLONE acetate (PRED FORTE) 1 % ophthalmic suspension SMARTSIG:1 Drop(s) Left Eye 6 Times Daily    . rosuvastatin (CRESTOR) 5 MG tablet Take 5 mg by mouth every evening.   3  . traZODone (DESYREL) 50 MG tablet Take 50 mg by mouth at bedtime.     No current facility-administered medications for this visit.    Allergies:   Betadine [povidone iodine]    ROS:  Please see the history of present illness.   Otherwise, review of systems are positive for none.   All other systems are reviewed and negative.    PHYSICAL EXAM: VS:  BP 122/62   Pulse (!) 56   Temp 97.9 F (36.6 C)   Ht 5' 6.5" (1.689 m)   Wt 173 lb 6.4 oz (78.7 kg)   SpO2 98%   BMI 27.57 kg/m  , BMI Body mass index is 27.57 kg/m. GENERAL:  Well appearing NECK:  No jugular venous distention, waveform within normal limits, carotid upstroke brisk and symmetric, no bruits, no thyromegaly LUNGS:  Clear to auscultation  bilaterally CHEST:  Unremarkable HEART:  PMI not displaced or sustained,S1 and S2 within normal limits, no S3, no S4, no clicks, no rubs, no murmurs ABD:  Flat, positive bowel sounds normal in frequency in pitch, no bruits, no rebound, no guarding, no midline pulsatile mass, no hepatomegaly, no splenomegaly EXT:  2 plus pulses throughout, no edema, no cyanosis no clubbing   EKG:  EKG is ordered today. Normal sinus rhythm, rate 56, left axis deviation, left anterior fascicular block pattern, interventricular conduction delay   Recent Labs: No results found for requested labs within last 8760 hours.    Lipid Panel No results found for: CHOL, TRIG, HDL, CHOLHDL, VLDL, LDLCALC, LDLDIRECT    Wt Readings from Last 3 Encounters:  04/29/19 173 lb 6.4 oz (78.7 kg)  10/19/18 173 lb 3.2 oz (78.6 kg)  07/23/18 163 lb (73.9 kg)      Other studies Reviewed: Additional studies/ records that were reviewed today include: Labs Review of the above records demonstrates:  Please see elsewhere in the note.     ASSESSMENT AND PLAN:  ATRIAL FIB:     [name] has a CHA2DS2 - VASc score of 3.   She has regular rhythm.  She tolerates anticoagulation.  No change in therapy.  DYSLIPIDEMIA:    LDL was 129 with HDL of 72.  I will defer to Susan Baton, MD   CHRONIC SYSTOLIC HF:    Her EF is 35% of her, this improved to 60 to 65% last year on echo.  No change in therapy.  OSA:   She tolerates the CPAP.  This is being managed by Dr. Radford Bell.    HTN: Blood pressure is at target.  No change in therapy.   COVID EDUCATION:  She has had the first dose of vaccine.     Current medicines are reviewed at length with the patient today.  The patient does not have concerns regarding medicines.  The following changes have been made:  As above  Labs/ tests ordered today include: None  Orders Placed This Encounter  Procedures  . EKG 12-Lead     Disposition:   FU with me in six months virtual is  OK.     Signed, Susan Breeding, MD  04/29/2019 12:41 PM    [facility]

## 2019-04-29 ENCOUNTER — Other Ambulatory Visit: Payer: Self-pay

## 2019-04-29 ENCOUNTER — Ambulatory Visit: Payer: Medicare Other | Admitting: Cardiology

## 2019-04-29 ENCOUNTER — Encounter: Payer: Self-pay | Admitting: Cardiology

## 2019-04-29 ENCOUNTER — Ambulatory Visit (INDEPENDENT_AMBULATORY_CARE_PROVIDER_SITE_OTHER): Payer: Medicare Other | Admitting: Cardiology

## 2019-04-29 VITALS — BP 122/62 | HR 56 | Temp 97.9°F | Ht 66.5 in | Wt 173.4 lb

## 2019-04-29 DIAGNOSIS — I10 Essential (primary) hypertension: Secondary | ICD-10-CM

## 2019-04-29 DIAGNOSIS — I5022 Chronic systolic (congestive) heart failure: Secondary | ICD-10-CM

## 2019-04-29 DIAGNOSIS — Z7189 Other specified counseling: Secondary | ICD-10-CM | POA: Diagnosis not present

## 2019-04-29 DIAGNOSIS — E785 Hyperlipidemia, unspecified: Secondary | ICD-10-CM | POA: Diagnosis not present

## 2019-04-29 DIAGNOSIS — I480 Paroxysmal atrial fibrillation: Secondary | ICD-10-CM | POA: Diagnosis not present

## 2019-04-29 NOTE — Patient Instructions (Signed)
Medication Instructions:  No Changes *If you need a refill on your cardiac medications before your next appointment, please call your pharmacy*  Lab Work: None  Testing/Procedures: None  Follow-Up: At [facility], you and your health needs are our priority.  As part of our continuing mission to provide you with exceptional heart care, we have created designated Provider Care Teams.  These Care Teams include your primary Cardiologist (physician) and Advanced Practice Providers (APPs -  Physician Assistants and Nurse Practitioners) who all work together to provide you with the care you need, when you need it.  Your next appointment:   1 year(s)  You will receive a reminder letter in the mail two months in advance. If you don't receive a letter, please call our office to schedule the follow-up appointment.   The format for your next appointment:   In Person  Provider:   Minus Breeding, MD

## 2019-05-02 ENCOUNTER — Telehealth: Payer: Self-pay | Admitting: *Deleted

## 2019-05-02 NOTE — Telephone Encounter (Signed)

## 2019-05-06 ENCOUNTER — Ambulatory Visit: Payer: Medicare Other | Attending: Internal Medicine

## 2019-05-06 DIAGNOSIS — Z23 Encounter for immunization: Secondary | ICD-10-CM | POA: Insufficient documentation

## 2019-05-06 NOTE — Progress Notes (Signed)
   Covid-19 Vaccination Clinic  Name:  Susan Bell    MRN: [id number] DOB: 03-18-1941  05/06/2019  Susan Bell was observed post Covid-19 immunization for 15 minutes without incidence. She was provided with Vaccine Information Sheet and instruction to access the V-Safe system.   Susan Bell was instructed to call 911 with any severe reactions post vaccine: Marland Kitchen Difficulty breathing  . Swelling of your face and throat  . A fast heartbeat  . A bad rash all over your body  . Dizziness and weakness    Immunizations Administered    Name Date Dose VIS Date Route   Pfizer COVID-19 Vaccine 05/06/2019  9:27 AM 0.3 mL 03/15/2019 Intramuscular   Manufacturer: [place]   Lot: [id number]   [place]: [id number]

## 2019-05-07 NOTE — Progress Notes (Signed)
Virtual Visit via Telephone Note   This visit type was conducted due to national recommendations for restrictions regarding the COVID-19 Pandemic (e.g. social distancing) in an effort to limit this patient's exposure and mitigate transmission in our community.  Due to her co-morbid illnesses, this patient is at least at moderate risk for complications without adequate follow up.  This format is felt to be most appropriate for this patient at this time.  All issues noted in this document were discussed and addressed.  A limited physical exam was performed with this format.  Please refer to the patient's chart for her consent to telehealth for [facility].   Evaluation Performed:  Follow-up visit  This visit type was conducted due to national recommendations for restrictions regarding the COVID-19 Pandemic (e.g. social distancing).  This format is felt to be most appropriate for this patient at this time.  All issues noted in this document were discussed and addressed.  No physical exam was performed (except for noted visual exam findings with Video Visits).  Please refer to the patient's chart (MyChart message for video visits and phone note for telephone visits) for the patient's consent to telehealth for [facility].  Date:  05/08/2019   ID:  Susan Bell, DOB 07-21-1940, MRN [id number]  Patient Location:  Home  Provider location:   [place]  PCP:  Shon Baton, MD  Cardiologist:  Minus Breeding, MD  Sleep Medicine:  Fransico Him, MD Electrophysiologist:  None   Chief Complaint:  OSA  History of Present Illness:    Susan Bell is a 79 y.o. female who presents via audio/video conferencing for a telehealth visit today.    Susan Bell is a 79 y.o. female with a hx of PAF followed in afib clinic who was referred for sleep study.  She was found to have mild OSA with a AHI of 12.7/hr and underwent CPAP titration to 10cm H2O.  She is doing well with his CPAP device.   She tolerates the full face mask and feels the pressure is adequate.  Since going on CPAP she feels rested in the am and has no significant daytime sleepiness.  She denies any significant mouth or nasal dryness or nasal congestion.  She does not think that he snores.    The patient does not have symptoms concerning for COVID-19 infection (fever, chills, cough, or new shortness of breath).   Prior CV studies:   The following studies were reviewed today:  PAP compliance download  Past Medical History:  Diagnosis Date  . Allergy    seasonal  . Anemia   . Arthritis   . Cataract    bilateral removed  . Celiac disease   . Eczema   . H/O transfusion of platelets   . Hyperlipidemia   . Hypothyroidism   . OSA (obstructive sleep apnea)   . Osteopenia   . Paroxysmal atrial fibrillation (HCC)   . Shingles   . Vitamin D deficiency    Past Surgical History:  Procedure Laterality Date  . CARDIOVERSION N/A 12/14/2017   Procedure: CARDIOVERSION;  Surgeon: Sanda Klein, MD;  [facility]: [facility];  Service: Cardiovascular;  Laterality: N/A;  . CATARACT EXTRACTION     bilateral  . COLONOSCOPY    . DILATION AND CURETTAGE OF UTERUS    . UPPER GI ENDOSCOPY       Current Meds  Medication Sig  . alendronate (FOSAMAX) 70 MG tablet Take 70 mg by mouth every Tuesday.   Marland Kitchen  amLODipine (NORVASC) 5 MG tablet TAKE 1 TABLET BY MOUTH EVERY DAY  . Calcium Carbonate-Vitamin D (CALCIUM 600+D PO) Take 1 tablet by mouth daily at 12 noon.  Marland Kitchen ELIQUIS 5 MG TABS tablet TAKE 1 TABLET TWICE A DAY  . levothyroxine (LEVOTHROID) 88 MCG tablet Take 88 mcg by mouth daily.    Marland Kitchen loratadine (CLARITIN) 10 MG tablet Take 10 mg by mouth every evening.   Marland Kitchen losartan (COZAAR) 50 MG tablet Take 1 tablet (50 mg total) by mouth daily.  Marland Kitchen MELATONIN PO Take 2 mg by mouth daily as needed (sleep).  . metoprolol succinate (TOPROL-XL) 50 MG 24 hr tablet TAKE 1 TABLET BY MOUTH DAILY. TAKE WITH OR IMMEDIATELY FOLLOWING A MEAL.  .  Multiple Vitamins-Minerals (ADULT ONE DAILY GUMMIES) CHEW Chew 2 tablets by mouth daily.  Marland Kitchen olopatadine (PATANOL) 0.1 % ophthalmic solution Place 1 drop into both eyes as needed for allergies.   Marland Kitchen omeprazole (PRILOSEC) 20 MG capsule Take 20 mg by mouth at bedtime.  Marland Kitchen oxybutynin (DITROPAN) 5 MG tablet Take 2.5 mg every evening by mouth.  . prednisoLONE acetate (PRED FORTE) 1 % ophthalmic suspension SMARTSIG:1 Drop(s) Left Eye 6 Times Daily  . rosuvastatin (CRESTOR) 5 MG tablet Take 5 mg by mouth every evening.   . traZODone (DESYREL) 50 MG tablet Take 50 mg by mouth at bedtime.     Allergies:   Betadine [povidone iodine]   Social History   Tobacco Use  . Smoking status: Never Smoker  . Smokeless tobacco: Never Used  Substance Use Topics  . Alcohol use: No    Alcohol/week: 0.0 standard drinks  . Drug use: No     Family Hx: The patient's family history includes Diabetes in her father; Heart disease in her father; Liver cancer in her mother. There is no history of Colon cancer.  ROS:   Please see the history of present illness.     All other systems reviewed and are negative.   Labs/Other Tests and Data Reviewed:    Recent Labs: No results found for requested labs within last 8760 hours.   Recent Lipid Panel No results found for: CHOL, TRIG, HDL, CHOLHDL, LDLCALC, LDLDIRECT  Wt Readings from Last 3 Encounters:  05/08/19 172 lb (78 kg)  04/29/19 173 lb 6.4 oz (78.7 kg)  10/19/18 173 lb 3.2 oz (78.6 kg)     Objective:    Vital Signs:  BP 122/62   Pulse (!) 56   Ht 5' 6.5" (1.689 m)   Wt 172 lb (78 kg)   BMI 27.35 kg/m     ASSESSMENT & PLAN:    1.  OSA -  The patient is tolerating PAP therapy well without any problems. The PAP download was reviewed today and showed an AHI of 2.1/hr on auto PAP  with 100% compliance in using more than 4 hours nightly.  The patient has been using and benefiting from PAP use and will continue to benefit from therapy.   2.  HTN -BP  controlled -continue Losartan 80m daily and amlodipine 536mdaily   COVID-19 Education: The signs and symptoms of COVID-19 were discussed with the patient and how to seek care for testing (follow up with PCP or arrange E-visit).  The importance of social distancing was discussed today.  Patient Risk:   After full review of this patient's clinical status, I feel that they are at least moderate risk at this time.  Time:   Today, I have spent 15 minutes directly  with the patient on Telemedicine discussing medical problems including OSA, HTN, Obesity.  We also reviewed the symptoms of COVID 19 and the ways to protect against contracting the virus with telehealth technology.  I spent an additional 5 minutes reviewing patient's chart including PAP compliance download.  Medication Adjustments/Labs and Tests Ordered: Current medicines are reviewed at length with the patient today.  Concerns regarding medicines are outlined above.  Tests Ordered: No orders of the defined types were placed in this encounter.  Medication Changes: No orders of the defined types were placed in this encounter.   Disposition:  Follow up in 1 year(s)  Signed, Fransico Him, MD  05/08/2019 1:39 PM    [facility]

## 2019-05-08 ENCOUNTER — Encounter: Payer: Self-pay | Admitting: Cardiology

## 2019-05-08 ENCOUNTER — Other Ambulatory Visit: Payer: Self-pay

## 2019-05-08 ENCOUNTER — Telehealth (INDEPENDENT_AMBULATORY_CARE_PROVIDER_SITE_OTHER): Payer: Medicare Other | Admitting: Cardiology

## 2019-05-08 VITALS — BP 122/62 | HR 56 | Ht 66.5 in | Wt 172.0 lb

## 2019-05-08 DIAGNOSIS — G4733 Obstructive sleep apnea (adult) (pediatric): Secondary | ICD-10-CM | POA: Diagnosis not present

## 2019-05-08 DIAGNOSIS — I10 Essential (primary) hypertension: Secondary | ICD-10-CM | POA: Diagnosis not present

## 2019-05-08 NOTE — Patient Instructions (Signed)

## 2019-05-14 DIAGNOSIS — H0102A Squamous blepharitis right eye, upper and lower eyelids: Secondary | ICD-10-CM | POA: Diagnosis not present

## 2019-05-14 DIAGNOSIS — H0102B Squamous blepharitis left eye, upper and lower eyelids: Secondary | ICD-10-CM | POA: Diagnosis not present

## 2019-05-14 DIAGNOSIS — H02834 Dermatochalasis of left upper eyelid: Secondary | ICD-10-CM | POA: Diagnosis not present

## 2019-05-14 DIAGNOSIS — Z961 Presence of intraocular lens: Secondary | ICD-10-CM | POA: Diagnosis not present

## 2019-05-14 DIAGNOSIS — H1045 Other chronic allergic conjunctivitis: Secondary | ICD-10-CM | POA: Diagnosis not present

## 2019-05-14 DIAGNOSIS — H18513 Endothelial corneal dystrophy, bilateral: Secondary | ICD-10-CM | POA: Diagnosis not present

## 2019-05-14 DIAGNOSIS — H02831 Dermatochalasis of right upper eyelid: Secondary | ICD-10-CM | POA: Diagnosis not present

## 2019-05-14 DIAGNOSIS — Z947 Corneal transplant status: Secondary | ICD-10-CM | POA: Diagnosis not present

## 2019-05-27 DIAGNOSIS — H02834 Dermatochalasis of left upper eyelid: Secondary | ICD-10-CM | POA: Diagnosis not present

## 2019-05-27 DIAGNOSIS — Z947 Corneal transplant status: Secondary | ICD-10-CM | POA: Diagnosis not present

## 2019-05-27 DIAGNOSIS — H02831 Dermatochalasis of right upper eyelid: Secondary | ICD-10-CM | POA: Diagnosis not present

## 2019-05-27 DIAGNOSIS — Z961 Presence of intraocular lens: Secondary | ICD-10-CM | POA: Diagnosis not present

## 2019-06-03 DIAGNOSIS — M859 Disorder of bone density and structure, unspecified: Secondary | ICD-10-CM | POA: Diagnosis not present

## 2019-06-03 DIAGNOSIS — E038 Other specified hypothyroidism: Secondary | ICD-10-CM | POA: Diagnosis not present

## 2019-06-03 DIAGNOSIS — M170 Bilateral primary osteoarthritis of knee: Secondary | ICD-10-CM | POA: Diagnosis not present

## 2019-06-03 DIAGNOSIS — E7849 Other hyperlipidemia: Secondary | ICD-10-CM | POA: Diagnosis not present

## 2019-06-06 DIAGNOSIS — I10 Essential (primary) hypertension: Secondary | ICD-10-CM | POA: Diagnosis not present

## 2019-06-06 DIAGNOSIS — R82998 Other abnormal findings in urine: Secondary | ICD-10-CM | POA: Diagnosis not present

## 2019-06-10 DIAGNOSIS — E559 Vitamin D deficiency, unspecified: Secondary | ICD-10-CM | POA: Diagnosis not present

## 2019-06-10 DIAGNOSIS — E039 Hypothyroidism, unspecified: Secondary | ICD-10-CM | POA: Diagnosis not present

## 2019-06-10 DIAGNOSIS — M1990 Unspecified osteoarthritis, unspecified site: Secondary | ICD-10-CM | POA: Diagnosis not present

## 2019-06-10 DIAGNOSIS — I480 Paroxysmal atrial fibrillation: Secondary | ICD-10-CM | POA: Diagnosis not present

## 2019-06-10 DIAGNOSIS — Z0000 Encounter for general adult medical examination without abnormal findings: Secondary | ICD-10-CM | POA: Diagnosis not present

## 2019-06-10 DIAGNOSIS — Z947 Corneal transplant status: Secondary | ICD-10-CM | POA: Diagnosis not present

## 2019-06-10 DIAGNOSIS — E785 Hyperlipidemia, unspecified: Secondary | ICD-10-CM | POA: Diagnosis not present

## 2019-06-10 DIAGNOSIS — I7300 Raynaud's syndrome without gangrene: Secondary | ICD-10-CM | POA: Diagnosis not present

## 2019-06-10 DIAGNOSIS — R600 Localized edema: Secondary | ICD-10-CM | POA: Diagnosis not present

## 2019-06-10 DIAGNOSIS — G4733 Obstructive sleep apnea (adult) (pediatric): Secondary | ICD-10-CM | POA: Diagnosis not present

## 2019-06-10 DIAGNOSIS — D649 Anemia, unspecified: Secondary | ICD-10-CM | POA: Diagnosis not present

## 2019-06-10 DIAGNOSIS — H9190 Unspecified hearing loss, unspecified ear: Secondary | ICD-10-CM | POA: Diagnosis not present

## 2019-06-20 ENCOUNTER — Ambulatory Visit (INDEPENDENT_AMBULATORY_CARE_PROVIDER_SITE_OTHER): Payer: Medicare Other | Admitting: Podiatry

## 2019-06-20 ENCOUNTER — Ambulatory Visit (INDEPENDENT_AMBULATORY_CARE_PROVIDER_SITE_OTHER): Payer: Medicare Other

## 2019-06-20 ENCOUNTER — Other Ambulatory Visit: Payer: Self-pay

## 2019-06-20 DIAGNOSIS — G629 Polyneuropathy, unspecified: Secondary | ICD-10-CM | POA: Diagnosis not present

## 2019-06-20 DIAGNOSIS — L601 Onycholysis: Secondary | ICD-10-CM

## 2019-06-20 DIAGNOSIS — M2041 Other hammer toe(s) (acquired), right foot: Secondary | ICD-10-CM | POA: Diagnosis not present

## 2019-06-20 DIAGNOSIS — M79674 Pain in right toe(s): Secondary | ICD-10-CM

## 2019-06-20 DIAGNOSIS — M779 Enthesopathy, unspecified: Secondary | ICD-10-CM

## 2019-06-20 DIAGNOSIS — M2042 Other hammer toe(s) (acquired), left foot: Secondary | ICD-10-CM

## 2019-06-20 DIAGNOSIS — L600 Ingrowing nail: Secondary | ICD-10-CM

## 2019-06-20 DIAGNOSIS — M79675 Pain in left toe(s): Secondary | ICD-10-CM | POA: Diagnosis not present

## 2019-06-24 ENCOUNTER — Other Ambulatory Visit: Payer: Self-pay | Admitting: Podiatry

## 2019-06-24 DIAGNOSIS — M2041 Other hammer toe(s) (acquired), right foot: Secondary | ICD-10-CM

## 2019-06-24 NOTE — Progress Notes (Signed)
Subjective: 79 year old female presents the office today for concerns of pain to the right second, third, fourth toes as well as her left big toenail.  She states on the left big toe she did hit this nail about 4 days ago and has had some discomfort but today is feeling better.  Denies any drainage or pus from the toenail site.  The nail is loose.  On the right side she is having burning pain to the tips of the toes and she is not sure if this is because the nails growing into the skin.  She is already on gabapentin for nerve issues that she developed due to her left second toe.Denies any systemic complaints such as fevers, chills, nausea, vomiting. No acute changes since last appointment, and no other complaints at this time.   Objective: AAO x3, NAD DP/PT pulses palpable bilaterally, CRT less than 3 seconds Hammertoe contractures are present on the right foot.  There is incurvation present along both medial lateral nail borders of the lesser digits on the right side.  The left hallux toenail is hypertrophic, dystrophic and loose and underlying nail bed only attached on the proximal aspect.  There is no open lesions.  There is no other areas of discomfort.  No open lesions or pre-ulcerative lesions.  No pain with calf compression, swelling, warmth, erythema  Assessment: Ingrown toenails right side with hammertoe deformity, left hallux onycholysis; neuropathy  Plan: -All treatment options discussed with the patient including all alternatives, risks, complications.  -X-rays obtained reviewed and there is no evidence of acute fracture identified.  Digital contractures are present. -It does sound like some of her symptoms are due to neuropathy.  She is already on gabapentin.  Today should be debrided the nails with any complications or bleeding.  And also dispensed a toe crest for the right side to see if taking pressure off the tip of the toes will be helpful.  I debrided the left hallux toenail any  complications or bleeding. -Patient encouraged to call the office with any questions, concerns, change in symptoms.   Trula Slade DPM

## 2019-06-25 DIAGNOSIS — Z1212 Encounter for screening for malignant neoplasm of rectum: Secondary | ICD-10-CM | POA: Diagnosis not present

## 2019-07-04 DIAGNOSIS — M545 Low back pain: Secondary | ICD-10-CM | POA: Diagnosis not present

## 2019-07-04 DIAGNOSIS — M170 Bilateral primary osteoarthritis of knee: Secondary | ICD-10-CM | POA: Diagnosis not present

## 2019-07-11 DIAGNOSIS — M170 Bilateral primary osteoarthritis of knee: Secondary | ICD-10-CM | POA: Diagnosis not present

## 2019-07-17 DIAGNOSIS — M170 Bilateral primary osteoarthritis of knee: Secondary | ICD-10-CM | POA: Diagnosis not present

## 2019-07-29 DIAGNOSIS — H02423 Myogenic ptosis of bilateral eyelids: Secondary | ICD-10-CM | POA: Diagnosis not present

## 2019-07-29 DIAGNOSIS — H18519 Endothelial corneal dystrophy, unspecified eye: Secondary | ICD-10-CM | POA: Insufficient documentation

## 2019-07-29 DIAGNOSIS — K219 Gastro-esophageal reflux disease without esophagitis: Secondary | ICD-10-CM | POA: Insufficient documentation

## 2019-07-29 DIAGNOSIS — E7800 Pure hypercholesterolemia, unspecified: Secondary | ICD-10-CM | POA: Insufficient documentation

## 2019-07-29 DIAGNOSIS — H40009 Preglaucoma, unspecified, unspecified eye: Secondary | ICD-10-CM | POA: Insufficient documentation

## 2019-07-29 DIAGNOSIS — H02831 Dermatochalasis of right upper eyelid: Secondary | ICD-10-CM | POA: Diagnosis not present

## 2019-07-29 DIAGNOSIS — H02834 Dermatochalasis of left upper eyelid: Secondary | ICD-10-CM | POA: Diagnosis not present

## 2019-07-29 DIAGNOSIS — M1990 Unspecified osteoarthritis, unspecified site: Secondary | ICD-10-CM | POA: Insufficient documentation

## 2019-07-29 DIAGNOSIS — J302 Other seasonal allergic rhinitis: Secondary | ICD-10-CM | POA: Insufficient documentation

## 2019-08-01 ENCOUNTER — Ambulatory Visit: Payer: Medicare Other | Admitting: Podiatry

## 2019-08-28 DIAGNOSIS — H02834 Dermatochalasis of left upper eyelid: Secondary | ICD-10-CM | POA: Diagnosis not present

## 2019-08-28 DIAGNOSIS — H18513 Endothelial corneal dystrophy, bilateral: Secondary | ICD-10-CM | POA: Diagnosis not present

## 2019-08-28 DIAGNOSIS — H02831 Dermatochalasis of right upper eyelid: Secondary | ICD-10-CM | POA: Diagnosis not present

## 2019-08-28 DIAGNOSIS — H0102A Squamous blepharitis right eye, upper and lower eyelids: Secondary | ICD-10-CM | POA: Diagnosis not present

## 2019-08-28 DIAGNOSIS — Z947 Corneal transplant status: Secondary | ICD-10-CM | POA: Diagnosis not present

## 2019-08-28 DIAGNOSIS — H0102B Squamous blepharitis left eye, upper and lower eyelids: Secondary | ICD-10-CM | POA: Diagnosis not present

## 2019-08-28 DIAGNOSIS — H1045 Other chronic allergic conjunctivitis: Secondary | ICD-10-CM | POA: Diagnosis not present

## 2019-09-03 ENCOUNTER — Telehealth: Payer: Self-pay

## 2019-09-03 NOTE — Telephone Encounter (Signed)
   [facility] Pre-operative Risk Assessment    HEARTCARE STAFF: - Please ensure there is not already an duplicate clearance open for this procedure. - Under Visit Info/Reason for Call, type in Other and utilize the format Clearance MM/DD/YY or Clearance TBD. Do not use dashes or single digits. - If request is for dental extraction, please clarify the # of teeth to be extracted.  Request for surgical clearance:  1. What type of surgery is being performed? Bilateral upper lid ptosis repair and biepharolasty  2. When is this surgery scheduled? 09/18/19  3. What type of clearance is required (medical clearance vs. Pharmacy clearance to hold med vs. Both)? both  4. Are there any medications that need to be held prior to surgery and how long? eliquis  5. Practice name and name of physician performing surgery? [facility]-- Ramonita Lab MD  6. What is the office phone number? [phone]   7.   What is the office fax number? [phone]  8.   Anesthesia type (None, local, MAC, general) ? choice   Susan Bell June 09/03/2019, 2:57 PM  _________________________________________________________________   (provider comments below)

## 2019-09-04 NOTE — Telephone Encounter (Signed)
Clinical pharmacist to review eliquis

## 2019-09-04 NOTE — Telephone Encounter (Signed)
Patient with diagnosis of afib on Eliquis for anticoagulation.    Procedure: Bilateral upper lid ptosis repair and biepharolasty Date of procedure: 09/18/19  CHADS2-VASc score of  5 (CHF, HTN, AGE,AGE, female)  CrCl 67 ml/min  Per office protocol, patient can hold Eliquis for 2 days prior to procedure.

## 2019-09-05 NOTE — Telephone Encounter (Signed)
   Primary Cardiologist: Minus Breeding, MD  Chart reviewed as part of pre-operative protocol coverage. Patient was contacted 09/05/2019 in reference to pre-operative risk assessment for pending surgery as outlined below.  Susan Bell was last seen on 04/29/2019 by Dr. Percival Spanish.  Since that day, Susan Bell has done well without chest pain or shortness of breath.  Therefore, based on ACC/AHA guidelines, the patient would be at acceptable risk for the planned procedure without further cardiovascular testing.   I will route this recommendation to the requesting party via Epic fax function and remove from pre-op pool. Please call with questions.  Per our clinical pharmacist, she may hold Eliquis for 2 days prior to the procedure and restart as soon as possible after the surgery at the surgeon's discretion.   [place], [place] 09/05/2019, 1:22 PM

## 2019-09-09 DIAGNOSIS — N390 Urinary tract infection, site not specified: Secondary | ICD-10-CM | POA: Diagnosis not present

## 2019-09-09 DIAGNOSIS — R350 Frequency of micturition: Secondary | ICD-10-CM | POA: Diagnosis not present

## 2019-09-10 DIAGNOSIS — H6121 Impacted cerumen, right ear: Secondary | ICD-10-CM | POA: Diagnosis not present

## 2019-09-10 DIAGNOSIS — H9190 Unspecified hearing loss, unspecified ear: Secondary | ICD-10-CM | POA: Diagnosis not present

## 2019-09-18 DIAGNOSIS — H02834 Dermatochalasis of left upper eyelid: Secondary | ICD-10-CM | POA: Diagnosis not present

## 2019-09-18 DIAGNOSIS — H02423 Myogenic ptosis of bilateral eyelids: Secondary | ICD-10-CM | POA: Diagnosis not present

## 2019-09-18 DIAGNOSIS — H02831 Dermatochalasis of right upper eyelid: Secondary | ICD-10-CM | POA: Diagnosis not present

## 2019-09-20 ENCOUNTER — Other Ambulatory Visit: Payer: Self-pay | Admitting: Cardiology

## 2019-09-20 ENCOUNTER — Other Ambulatory Visit (HOSPITAL_COMMUNITY): Payer: Self-pay | Admitting: Nurse Practitioner

## 2019-10-02 ENCOUNTER — Other Ambulatory Visit (HOSPITAL_COMMUNITY): Payer: Self-pay | Admitting: Cardiology

## 2019-10-02 NOTE — Telephone Encounter (Signed)
Prescription refill request for Eliquis received. Indication: Atrial Fibrillation Last office visit: 05/08/2019 Dr Radford Pax Scr: 0.7 06/03/2019 Age: 79 Weight:78 kg    Prescription refilled

## 2019-10-17 DIAGNOSIS — Z9889 Other specified postprocedural states: Secondary | ICD-10-CM | POA: Diagnosis not present

## 2019-10-17 DIAGNOSIS — Z947 Corneal transplant status: Secondary | ICD-10-CM | POA: Diagnosis not present

## 2019-10-17 DIAGNOSIS — H40013 Open angle with borderline findings, low risk, bilateral: Secondary | ICD-10-CM | POA: Diagnosis not present

## 2019-10-24 DIAGNOSIS — M170 Bilateral primary osteoarthritis of knee: Secondary | ICD-10-CM | POA: Diagnosis not present

## 2019-10-24 DIAGNOSIS — M25562 Pain in left knee: Secondary | ICD-10-CM | POA: Diagnosis not present

## 2019-10-24 DIAGNOSIS — M1712 Unilateral primary osteoarthritis, left knee: Secondary | ICD-10-CM | POA: Diagnosis not present

## 2019-10-24 DIAGNOSIS — M25561 Pain in right knee: Secondary | ICD-10-CM | POA: Diagnosis not present

## 2019-10-24 DIAGNOSIS — M1711 Unilateral primary osteoarthritis, right knee: Secondary | ICD-10-CM | POA: Diagnosis not present

## 2019-10-28 DIAGNOSIS — Z1231 Encounter for screening mammogram for malignant neoplasm of breast: Secondary | ICD-10-CM | POA: Diagnosis not present

## 2019-11-01 ENCOUNTER — Encounter (HOSPITAL_COMMUNITY): Payer: Self-pay | Admitting: Nurse Practitioner

## 2019-11-01 ENCOUNTER — Ambulatory Visit (HOSPITAL_COMMUNITY)
Admission: RE | Admit: 2019-11-01 | Discharge: 2019-11-01 | Disposition: A | Discharge status: Home / Self Care | Payer: Medicare Other | Source: Ambulatory Visit | Attending: Nurse Practitioner | Admitting: Nurse Practitioner

## 2019-11-01 ENCOUNTER — Other Ambulatory Visit: Payer: Self-pay

## 2019-11-01 VITALS — BP 156/84 | HR 95 | Ht 66.5 in | Wt 167.6 lb

## 2019-11-01 DIAGNOSIS — Z79899 Other long term (current) drug therapy: Secondary | ICD-10-CM | POA: Insufficient documentation

## 2019-11-01 DIAGNOSIS — L309 Dermatitis, unspecified: Secondary | ICD-10-CM | POA: Insufficient documentation

## 2019-11-01 DIAGNOSIS — E559 Vitamin D deficiency, unspecified: Secondary | ICD-10-CM | POA: Insufficient documentation

## 2019-11-01 DIAGNOSIS — Z7901 Long term (current) use of anticoagulants: Secondary | ICD-10-CM | POA: Insufficient documentation

## 2019-11-01 DIAGNOSIS — E039 Hypothyroidism, unspecified: Secondary | ICD-10-CM | POA: Diagnosis not present

## 2019-11-01 DIAGNOSIS — G4733 Obstructive sleep apnea (adult) (pediatric): Secondary | ICD-10-CM | POA: Diagnosis not present

## 2019-11-01 DIAGNOSIS — I4819 Other persistent atrial fibrillation: Secondary | ICD-10-CM | POA: Diagnosis not present

## 2019-11-01 DIAGNOSIS — M1990 Unspecified osteoarthritis, unspecified site: Secondary | ICD-10-CM | POA: Insufficient documentation

## 2019-11-01 DIAGNOSIS — Z9841 Cataract extraction status, right eye: Secondary | ICD-10-CM | POA: Insufficient documentation

## 2019-11-01 DIAGNOSIS — M8580 Other specified disorders of bone density and structure, unspecified site: Secondary | ICD-10-CM | POA: Diagnosis not present

## 2019-11-01 DIAGNOSIS — D6869 Other thrombophilia: Secondary | ICD-10-CM

## 2019-11-01 DIAGNOSIS — K900 Celiac disease: Secondary | ICD-10-CM | POA: Insufficient documentation

## 2019-11-01 DIAGNOSIS — I10 Essential (primary) hypertension: Secondary | ICD-10-CM | POA: Diagnosis not present

## 2019-11-01 DIAGNOSIS — Z9842 Cataract extraction status, left eye: Secondary | ICD-10-CM | POA: Insufficient documentation

## 2019-11-01 DIAGNOSIS — D649 Anemia, unspecified: Secondary | ICD-10-CM | POA: Insufficient documentation

## 2019-11-01 DIAGNOSIS — E785 Hyperlipidemia, unspecified: Secondary | ICD-10-CM | POA: Diagnosis not present

## 2019-11-01 DIAGNOSIS — J302 Other seasonal allergic rhinitis: Secondary | ICD-10-CM | POA: Diagnosis not present

## 2019-11-01 DIAGNOSIS — Z79890 Hormone replacement therapy (postmenopausal): Secondary | ICD-10-CM | POA: Diagnosis not present

## 2019-11-01 DIAGNOSIS — I480 Paroxysmal atrial fibrillation: Secondary | ICD-10-CM | POA: Diagnosis not present

## 2019-11-01 DIAGNOSIS — Z888 Allergy status to other drugs, medicaments and biological substances status: Secondary | ICD-10-CM | POA: Diagnosis not present

## 2019-11-01 DIAGNOSIS — Z9989 Dependence on other enabling machines and devices: Secondary | ICD-10-CM | POA: Insufficient documentation

## 2019-11-01 NOTE — H&P (View-Only) (Signed)
Primary Care Physician: Shon Baton, MD Referring Physician: [facility] ER f/u Cardiologist-  [name]  EP- [name] is a 79 y.o. female with a h/o HLD, hypothyroidism  that was successfully cardioverted in the [facility] ER 10/26/17 for new onset Afib. She returned to  the afib office 8/2 but had gone back into  afib and was unaware. She was started on BB for better rate control.   She denied tobacco, alcohol, excessive caffeine. Had been told she snored and appeared to stop breathing at night. Sleep study done 8/30 showed OSA. CPAP started. She had an echo that showed EF at 35% with diffuse hypokinesis. Lexi myoview did not show any ischemia but was an intermediate test.  She was already on an ARB/BB.   She remained in afib, rate controlled. She denied any exertional chest pain, is fatigued and has noted shortness of breath more so with steps. She was admitted for Tikosyn.  However after discharge qt prolonged and Tikosyn was stopped.   Since then she has had a long period of time without any afib until this past Monday. No  specific trigger but afib has been persistent since then. She feels some nausea and fatigue while in afib. Afib in the 63 's today on EKG. Echo in 2020 showed normalization of EF.   Today, she denies symptoms of palpitations, chest pain, shortness of breath, orthopnea, PND, lower extremity edema, dizziness, presyncope, syncope, or neurologic sequela. The patient is tolerating medications without difficulties and is otherwise without complaint today.   Past Medical History:  Diagnosis Date  . Allergy    seasonal  . Anemia   . Arthritis   . Cataract    bilateral removed  . Celiac disease   . Eczema   . H/O transfusion of platelets   . Hyperlipidemia   . Hypothyroidism   . OSA (obstructive sleep apnea)   . Osteopenia   . Paroxysmal atrial fibrillation (HCC)   . Shingles   . Vitamin D deficiency    Past Surgical History:  Procedure Laterality Date    . CARDIOVERSION N/A 12/14/2017   Procedure: CARDIOVERSION;  Surgeon: Sanda Klein, MD;  Location: [facility];  Service: Cardiovascular;  Laterality: N/A;  . CATARACT EXTRACTION     bilateral  . COLONOSCOPY    . DILATION AND CURETTAGE OF UTERUS    . UPPER GI ENDOSCOPY      Current Outpatient Medications  Medication Sig Dispense Refill  . alendronate (FOSAMAX) 70 MG tablet Take 70 mg by mouth every Tuesday.     Marland Kitchen amLODipine (NORVASC) 5 MG tablet TAKE 1 TABLET BY MOUTH EVERY DAY 90 tablet 2  . Calcium Carbonate-Vitamin D (CALCIUM 600+D PO) Take 1 tablet by mouth daily at 12 noon.    . cholecalciferol (VITAMIN D) 400 units TABS tablet Take 400 Units by mouth daily at 12 noon.    . dorzolamide-timolol (COSOPT) 22.3-6.8 MG/ML ophthalmic solution Place 1 drop into the left eye 2 (two) times daily.    Marland Kitchen ELIQUIS 5 MG TABS tablet TAKE 1 TABLET TWICE A DAY 180 tablet 1  . gabapentin (NEURONTIN) 100 MG capsule Take 100 mg by mouth at bedtime.    Marland Kitchen levothyroxine (LEVOTHROID) 88 MCG tablet Take 88 mcg by mouth daily.      Marland Kitchen loratadine (CLARITIN) 10 MG tablet Take 10 mg by mouth every evening.     Marland Kitchen losartan (COZAAR) 50 MG tablet TAKE 1 TABLET BY MOUTH EVERY DAY  90 tablet 3  . metoprolol succinate (TOPROL-XL) 50 MG 24 hr tablet TAKE 1 TABLET BY MOUTH DAILY. TAKE WITH OR IMMEDIATELY FOLLOWING A MEAL. 90 tablet 2  . Multiple Vitamins-Minerals (ADULT ONE DAILY GUMMIES) CHEW Chew 2 tablets by mouth daily.    Marland Kitchen olopatadine (PATANOL) 0.1 % ophthalmic solution Place 1 drop into both eyes as needed for allergies.     Marland Kitchen omeprazole (PRILOSEC) 20 MG capsule Take 20 mg by mouth at bedtime.    Marland Kitchen oxybutynin (DITROPAN) 5 MG tablet Take 2.5 mg every evening by mouth.    Marland Kitchen PATADAY 0.2 % SOLN Apply 1 drop to eye daily.    . prednisoLONE acetate (PRED FORTE) 1 % ophthalmic suspension Place 1 drop into the left eye daily.     . rosuvastatin (CRESTOR) 5 MG tablet Take 5 mg by mouth every evening.   3  . SYSTANE ULTRA  0.4-0.3 % SOLN Apply 1 drop to eye 2 (two) times daily.     No current facility-administered medications for this encounter.    Allergies  Allergen Reactions  . Betadine [Povidone Iodine]     itching    Social History   Socioeconomic History  . Marital status: Married    Spouse name: Not on file  . Number of children: 4  . Years of education: Not on file  . Highest education level: Not on file  Occupational History  . Occupation: retired    Fish farm manager: RETIRED  Tobacco Use  . Smoking status: Never Smoker  . Smokeless tobacco: Never Used  Vaping Use  . Vaping Use: Never used  Substance and Sexual Activity  . Alcohol use: No    Alcohol/week: 0.0 standard drinks  . Drug use: No  . Sexual activity: Not on file  Other Topics Concern  . Not on file  Social History Narrative   Daily caffeine   Social Determinants of Health   Financial Resource Strain:   . Difficulty of Paying Living Expenses:   Food Insecurity:   . Worried About Charity fundraiser in the Last Year:   . Arboriculturist in the Last Year:   Transportation Needs:   . Film/video editor (Medical):   Marland Kitchen Lack of Transportation (Non-Medical):   Physical Activity:   . Days of Exercise per Week:   . Minutes of Exercise per Session:   Stress:   . Feeling of Stress :   Social Connections:   . Frequency of Communication with Friends and Family:   . Frequency of Social Gatherings with Friends and Family:   . Attends Religious Services:   . Active Member of Clubs or Organizations:   . Attends Archivist Meetings:   Marland Kitchen Marital Status:   Intimate Partner Violence:   . Fear of Current or Ex-Partner:   . Emotionally Abused:   Marland Kitchen Physically Abused:   . Sexually Abused:     Family History  Problem Relation Age of Onset  . Liver cancer Mother   . Diabetes Father   . Heart disease Father   . Colon cancer Neg Hx     ROS- All systems are reviewed and negative except as per the HPI above  Physical  Exam: Vitals:   11/01/19 1024  BP: (!) 156/84  Pulse: 95  Weight: 76 kg  Height: 5' 6.5" (1.689 m)   Wt Readings from Last 3 Encounters:  11/01/19 76 kg  05/08/19 78 kg  04/29/19 78.7 kg    Labs:  Lab Results  Component Value Date   NA 132 (L) 03/08/2018   K 4.0 03/08/2018   CL 98 03/08/2018   CO2 25 03/08/2018   GLUCOSE 94 03/08/2018   BUN 12 03/08/2018   CREATININE 0.97 03/08/2018   CALCIUM 9.0 03/08/2018   MG 2.0 12/25/2017   No results found for: INR No results found for: CHOL, HDL, LDLCALC, TRIG   GEN- The patient is well appearing, alert and oriented x 3 today.   Head- normocephalic, atraumatic Eyes-  Sclera clear, conjunctiva pink Ears- hearing intact Oropharynx- clear Neck- supple, no JVP Lymph- no cervical lymphadenopathy Lungs- Clear to ausculation bilaterally, normal work of breathing Heart- irregular rate and rhythm, no murmurs, rubs or gallops, PMI not laterally displaced GI- soft, NT, ND, + BS Extremities- no clubbing, cyanosis, or edema MS- no significant deformity or atrophy Skin- no rash or lesion Psych- euthymic mood, full affect Neuro- strength and sensation are intact  EKG-afib at 95 bpm, qrs itn 128 ms, qtc 452 ms  Epic records reviewed Echo- 1. The left ventricle has normal systolic function with an ejection  fraction of 60-65%. The cavity size was normal. There is mild concentric  left ventricular hypertrophy. Left ventricular diastolic Doppler  parameters are consistent with  pseudonormalization. Elevated mean left atrial pressure.  2. The right ventricle has normal systolic function. The cavity was  normal. There is no increase in right ventricular wall thickness. Right  ventricular systolic pressure is normal with an estimated pressure of 36.9  mmHg.  3. Left atrial size was mildly dilated.  4. There is mild to moderate mitral annular calcification present.  5. The aorta is normal in size and structure.    Assessment and  Plan: 1. Paroxysmal afib She has been  maintaining SR for several years, in afib since Monday No trigger identified Will try to increase metoprolol succinate 50 mg daily and  add 25 mg at HS  Continue CPAP She has had both covid shots   2. Chadsvasc score of  3 Continue eliquis 5 mg bid  States no missed doses  3. HTN Elevated today No changes, extra BB may settle    She  will return on Monday for EKG on extra BB If afib persists, she will be set up for cardioversion Bmet/cbc/covid test Reduce BB back to 50 mg daily prior to cardioversion as she has a HR in the 50's in SR.    Geroge Baseman Mattia Osterman, [facility] [address] [place], [place] [address] [phone]

## 2019-11-01 NOTE — Patient Instructions (Signed)
Add additional metoprolol 25 mg at hs over the weekend  F/u on Monday for ekg and if still in afib will be set up for DCCV

## 2019-11-01 NOTE — Progress Notes (Addendum)
Primary Care Physician: Shon Baton, MD Referring Physician: [facility] ER f/u Cardiologist-  [name]  EP- [name] is a 79 y.o. female with a h/o HLD, hypothyroidism  that was successfully cardioverted in the [facility] ER 10/26/17 for new onset Afib. She returned to  the afib office 8/2 but had gone back into  afib and was unaware. She was started on BB for better rate control.   She denied tobacco, alcohol, excessive caffeine. Had been told she snored and appeared to stop breathing at night. Sleep study done 8/30 showed OSA. CPAP started. She had an echo that showed EF at 35% with diffuse hypokinesis. Lexi myoview did not show any ischemia but was an intermediate test.  She was already on an ARB/BB.   She remained in afib, rate controlled. She denied any exertional chest pain, is fatigued and has noted shortness of breath more so with steps. She was admitted for Tikosyn.  However after discharge qt prolonged and Tikosyn was stopped.   Since then she has had a long period of time without any afib until this past Monday. No  specific trigger but afib has been persistent since then. She feels some nausea and fatigue while in afib. Afib in the 34 's today on EKG. Echo in 2020 showed normalization of EF.   Today, she denies symptoms of palpitations, chest pain, shortness of breath, orthopnea, PND, lower extremity edema, dizziness, presyncope, syncope, or neurologic sequela. The patient is tolerating medications without difficulties and is otherwise without complaint today.   Past Medical History:  Diagnosis Date  . Allergy    seasonal  . Anemia   . Arthritis   . Cataract    bilateral removed  . Celiac disease   . Eczema   . H/O transfusion of platelets   . Hyperlipidemia   . Hypothyroidism   . OSA (obstructive sleep apnea)   . Osteopenia   . Paroxysmal atrial fibrillation (HCC)   . Shingles   . Vitamin D deficiency    Past Surgical History:  Procedure Laterality Date    . CARDIOVERSION N/A 12/14/2017   Procedure: CARDIOVERSION;  Surgeon: Sanda Klein, MD;  Location: [facility] ENDOSCOPY;  Service: Cardiovascular;  Laterality: N/A;  . CATARACT EXTRACTION     bilateral  . COLONOSCOPY    . DILATION AND CURETTAGE OF UTERUS    . UPPER GI ENDOSCOPY      Current Outpatient Medications  Medication Sig Dispense Refill  . alendronate (FOSAMAX) 70 MG tablet Take 70 mg by mouth every Tuesday.     Marland Kitchen amLODipine (NORVASC) 5 MG tablet TAKE 1 TABLET BY MOUTH EVERY DAY 90 tablet 2  . Calcium Carbonate-Vitamin D (CALCIUM 600+D PO) Take 1 tablet by mouth daily at 12 noon.    . cholecalciferol (VITAMIN D) 400 units TABS tablet Take 400 Units by mouth daily at 12 noon.    . dorzolamide-timolol (COSOPT) 22.3-6.8 MG/ML ophthalmic solution Place 1 drop into the left eye 2 (two) times daily.    Marland Kitchen ELIQUIS 5 MG TABS tablet TAKE 1 TABLET TWICE A DAY 180 tablet 1  . gabapentin (NEURONTIN) 100 MG capsule Take 100 mg by mouth at bedtime.    Marland Kitchen levothyroxine (LEVOTHROID) 88 MCG tablet Take 88 mcg by mouth daily.      Marland Kitchen loratadine (CLARITIN) 10 MG tablet Take 10 mg by mouth every evening.     Marland Kitchen losartan (COZAAR) 50 MG tablet TAKE 1 TABLET BY MOUTH EVERY DAY  90 tablet 3  . metoprolol succinate (TOPROL-XL) 50 MG 24 hr tablet TAKE 1 TABLET BY MOUTH DAILY. TAKE WITH OR IMMEDIATELY FOLLOWING A MEAL. 90 tablet 2  . Multiple Vitamins-Minerals (ADULT ONE DAILY GUMMIES) CHEW Chew 2 tablets by mouth daily.    Marland Kitchen olopatadine (PATANOL) 0.1 % ophthalmic solution Place 1 drop into both eyes as needed for allergies.     Marland Kitchen omeprazole (PRILOSEC) 20 MG capsule Take 20 mg by mouth at bedtime.    Marland Kitchen oxybutynin (DITROPAN) 5 MG tablet Take 2.5 mg every evening by mouth.    Marland Kitchen PATADAY 0.2 % SOLN Apply 1 drop to eye daily.    . prednisoLONE acetate (PRED FORTE) 1 % ophthalmic suspension Place 1 drop into the left eye daily.     . rosuvastatin (CRESTOR) 5 MG tablet Take 5 mg by mouth every evening.   3  . SYSTANE ULTRA  0.4-0.3 % SOLN Apply 1 drop to eye 2 (two) times daily.     No current facility-administered medications for this encounter.    Allergies  Allergen Reactions  . Betadine [Povidone Iodine]     itching    Social History   Socioeconomic History  . Marital status: Married    Spouse name: Not on file  . Number of children: 4  . Years of education: Not on file  . Highest education level: Not on file  Occupational History  . Occupation: retired    Fish farm manager: RETIRED  Tobacco Use  . Smoking status: Never Smoker  . Smokeless tobacco: Never Used  Vaping Use  . Vaping Use: Never used  Substance and Sexual Activity  . Alcohol use: No    Alcohol/week: 0.0 standard drinks  . Drug use: No  . Sexual activity: Not on file  Other Topics Concern  . Not on file  Social History Narrative   Daily caffeine   Social Determinants of Health   Financial Resource Strain:   . Difficulty of Paying Living Expenses:   Food Insecurity:   . Worried About Charity fundraiser in the Last Year:   . Arboriculturist in the Last Year:   Transportation Needs:   . Film/video editor (Medical):   Marland Kitchen Lack of Transportation (Non-Medical):   Physical Activity:   . Days of Exercise per Week:   . Minutes of Exercise per Session:   Stress:   . Feeling of Stress :   Social Connections:   . Frequency of Communication with Friends and Family:   . Frequency of Social Gatherings with Friends and Family:   . Attends Religious Services:   . Active Member of Clubs or Organizations:   . Attends Archivist Meetings:   Marland Kitchen Marital Status:   Intimate Partner Violence:   . Fear of Current or Ex-Partner:   . Emotionally Abused:   Marland Kitchen Physically Abused:   . Sexually Abused:     Family History  Problem Relation Age of Onset  . Liver cancer Mother   . Diabetes Father   . Heart disease Father   . Colon cancer Neg Hx     ROS- All systems are reviewed and negative except as per the HPI above  Physical  Exam: Vitals:   11/01/19 1024  BP: (!) 156/84  Pulse: 95  Weight: 76 kg  Height: 5' 6.5" (1.689 m)   Wt Readings from Last 3 Encounters:  11/01/19 76 kg  05/08/19 78 kg  04/29/19 78.7 kg    Labs:  Lab Results  Component Value Date   NA 132 (L) 03/08/2018   K 4.0 03/08/2018   CL 98 03/08/2018   CO2 25 03/08/2018   GLUCOSE 94 03/08/2018   BUN 12 03/08/2018   CREATININE 0.97 03/08/2018   CALCIUM 9.0 03/08/2018   MG 2.0 12/25/2017   No results found for: INR No results found for: CHOL, HDL, LDLCALC, TRIG   GEN- The patient is well appearing, alert and oriented x 3 today.   Head- normocephalic, atraumatic Eyes-  Sclera clear, conjunctiva pink Ears- hearing intact Oropharynx- clear Neck- supple, no JVP Lymph- no cervical lymphadenopathy Lungs- Clear to ausculation bilaterally, normal work of breathing Heart- irregular rate and rhythm, no murmurs, rubs or gallops, PMI not laterally displaced GI- soft, NT, ND, + BS Extremities- no clubbing, cyanosis, or edema MS- no significant deformity or atrophy Skin- no rash or lesion Psych- euthymic mood, full affect Neuro- strength and sensation are intact  EKG-afib at 95 bpm, qrs itn 128 ms, qtc 452 ms  Epic records reviewed Echo- 1. The left ventricle has normal systolic function with an ejection  fraction of 60-65%. The cavity size was normal. There is mild concentric  left ventricular hypertrophy. Left ventricular diastolic Doppler  parameters are consistent with  pseudonormalization. Elevated mean left atrial pressure.  2. The right ventricle has normal systolic function. The cavity was  normal. There is no increase in right ventricular wall thickness. Right  ventricular systolic pressure is normal with an estimated pressure of 36.9  mmHg.  3. Left atrial size was mildly dilated.  4. There is mild to moderate mitral annular calcification present.  5. The aorta is normal in size and structure.    Assessment and  Plan: 1. Paroxysmal afib She has been  maintaining SR for several years, in afib since Monday No trigger identified Will try to increase metoprolol succinate 50 mg daily and  add 25 mg at HS  Continue CPAP She has had both covid shots   2. Chadsvasc score of  3 Continue eliquis 5 mg bid  States no missed doses  3. HTN Elevated today No changes, extra BB may settle    She  will return on Monday for EKG on extra BB If afib persists, she will be set up for cardioversion Bmet/cbc/covid test Reduce BB back to 50 mg daily prior to cardioversion as she has a HR in the 50's in SR.    Geroge Baseman Cornelia Walraven, [facility] [address] [place],  [address] [phone]

## 2019-11-04 ENCOUNTER — Other Ambulatory Visit: Payer: Self-pay

## 2019-11-04 ENCOUNTER — Ambulatory Visit (HOSPITAL_COMMUNITY)
Admission: RE | Admit: 2019-11-04 | Discharge: 2019-11-04 | Disposition: A | Discharge status: Home / Self Care | Payer: Medicare Other | Source: Ambulatory Visit | Attending: Physician Assistant | Admitting: Physician Assistant

## 2019-11-04 VITALS — BP 138/70 | HR 96 | Wt 167.0 lb

## 2019-11-04 DIAGNOSIS — I4819 Other persistent atrial fibrillation: Secondary | ICD-10-CM

## 2019-11-04 DIAGNOSIS — Z79899 Other long term (current) drug therapy: Secondary | ICD-10-CM | POA: Insufficient documentation

## 2019-11-04 DIAGNOSIS — I4891 Unspecified atrial fibrillation: Secondary | ICD-10-CM | POA: Insufficient documentation

## 2019-11-04 LAB — BASIC METABOLIC PANEL
Anion gap: 7 (ref 5–15)
BUN: 15 mg/dL (ref 8–23)
CO2: 25 mmol/L (ref 22–32)
Calcium: 9 mg/dL (ref 8.9–10.3)
Chloride: 101 mmol/L (ref 98–111)
Creatinine, Ser: 0.86 mg/dL (ref 0.44–1.00)
GFR calc Af Amer: >60 mL/min (ref >60)
GFR calc non Af Amer: >60 mL/min (ref >60)
Glucose, Bld: 96 mg/dL (ref 70–99)
Potassium: 4.7 mmol/L (ref 3.5–5.1)
Sodium: 133 mmol/L — ABNORMAL LOW (ref 135–145)

## 2019-11-04 LAB — CBC
HCT: 36.3 % (ref 36.0–46.0)
Hemoglobin: 11.6 g/dL — ABNORMAL LOW (ref 12.0–15.0)
MCH: 30.7 pg (ref 26.0–34.0)
MCHC: 32 g/dL (ref 30.0–36.0)
MCV: 96 fL (ref 80.0–100.0)
Platelets: 401 10*3/uL — ABNORMAL HIGH (ref 150–400)
RBC: 3.78 MIL/uL — ABNORMAL LOW (ref 3.87–5.11)
RDW: 14.6 % (ref 11.5–15.5)
WBC: 11.9 10*3/uL — ABNORMAL HIGH (ref 4.0–10.5)
nRBC: 0 % (ref 0.0–0.2)

## 2019-11-04 NOTE — Progress Notes (Signed)
Patient returns for ECG today. ECG shows afib HR 96, nonspecific IVCD, QRS 128, QTc 454. Will arrange for DCCV. Check bmet/CBC. Patient to continue extra dose of Toprol 25 mg until day prior to DCCV. Follow up with AF clinic one week post DCCV.

## 2019-11-04 NOTE — H&P (View-Only) (Signed)
Patient returns for ECG today. ECG shows afib HR 96, nonspecific IVCD, QRS 128, QTc 454. Will arrange for DCCV. Check bmet/CBC. Patient to continue extra dose of Toprol 25 mg until day prior to DCCV. Follow up with AF clinic one week post DCCV.

## 2019-11-04 NOTE — Patient Instructions (Signed)
Cardioversion scheduled for Thursday, August 5th  - Arrive at the [facility] and go to admitting at [facility] not eat or drink anything after midnight the night prior to your procedure.  - Take all your morning medication (except diabetic medications) with a sip of water prior to arrival.  - You will not be able to drive home after your procedure.  - Do NOT miss any doses of your blood thinner - if you should miss a dose please notify our office immediately.  Wednesday go back to normal dosing of metoprolol

## 2019-11-05 ENCOUNTER — Other Ambulatory Visit (HOSPITAL_COMMUNITY)
Admission: RE | Admit: 2019-11-05 | Discharge: 2019-11-05 | Disposition: A | Discharge status: Home / Self Care | Payer: Medicare Other | Source: Ambulatory Visit | Attending: Cardiology | Admitting: Cardiology

## 2019-11-05 DIAGNOSIS — Z01812 Encounter for preprocedural laboratory examination: Secondary | ICD-10-CM | POA: Insufficient documentation

## 2019-11-05 DIAGNOSIS — Z20822 Contact with and (suspected) exposure to covid-19: Secondary | ICD-10-CM | POA: Diagnosis not present

## 2019-11-05 LAB — SARS CORONAVIRUS 2 (TAT 6-24 HRS): SARS Coronavirus 2: NEGATIVE

## 2019-11-07 ENCOUNTER — Ambulatory Visit (HOSPITAL_COMMUNITY)
Admission: RE | Admit: 2019-11-07 | Discharge: 2019-11-07 | Disposition: A | Discharge status: Home / Self Care | Payer: Medicare Other | Attending: Cardiology | Admitting: Cardiology

## 2019-11-07 ENCOUNTER — Encounter (HOSPITAL_COMMUNITY)
Admission: RE | Disposition: A | Discharge status: Home / Self Care | Payer: Self-pay | Source: Home / Self Care | Attending: Cardiology

## 2019-11-07 ENCOUNTER — Other Ambulatory Visit: Payer: Self-pay

## 2019-11-07 ENCOUNTER — Ambulatory Visit (HOSPITAL_COMMUNITY): Payer: Medicare Other | Admitting: Certified Registered"

## 2019-11-07 ENCOUNTER — Encounter (HOSPITAL_COMMUNITY): Payer: Self-pay | Admitting: Cardiology

## 2019-11-07 DIAGNOSIS — E785 Hyperlipidemia, unspecified: Secondary | ICD-10-CM | POA: Diagnosis not present

## 2019-11-07 DIAGNOSIS — I5022 Chronic systolic (congestive) heart failure: Secondary | ICD-10-CM | POA: Diagnosis not present

## 2019-11-07 DIAGNOSIS — Z79899 Other long term (current) drug therapy: Secondary | ICD-10-CM | POA: Insufficient documentation

## 2019-11-07 DIAGNOSIS — I480 Paroxysmal atrial fibrillation: Secondary | ICD-10-CM | POA: Diagnosis not present

## 2019-11-07 DIAGNOSIS — I110 Hypertensive heart disease with heart failure: Secondary | ICD-10-CM | POA: Diagnosis not present

## 2019-11-07 DIAGNOSIS — I4891 Unspecified atrial fibrillation: Secondary | ICD-10-CM | POA: Insufficient documentation

## 2019-11-07 HISTORY — PX: CARDIOVERSION: SHX1299

## 2019-11-07 SURGERY — CARDIOVERSION
Anesthesia: General

## 2019-11-07 MED ORDER — SODIUM CHLORIDE 0.9 % IV SOLN
INTRAVENOUS | Status: AC | PRN
  Administered 2019-11-07: 500 mL via INTRAVENOUS

## 2019-11-07 MED ORDER — SODIUM CHLORIDE 0.9 % IV SOLN: INTRAVENOUS | Status: DC | PRN

## 2019-11-07 MED ORDER — PROPOFOL 10 MG/ML IV BOLUS
INTRAVENOUS | Status: DC | PRN
  Administered 2019-11-07: 50 mg via INTRAVENOUS

## 2019-11-07 MED ORDER — LIDOCAINE 2% (20 MG/ML) 5 ML SYRINGE
INTRAMUSCULAR | Status: DC | PRN
  Administered 2019-11-07: 80 mg via INTRAVENOUS

## 2019-11-07 NOTE — Anesthesia Postprocedure Evaluation (Signed)
Anesthesia Post Note  Patient: Susan Bell  Procedure(s) Performed: CARDIOVERSION (N/A )     Patient location during evaluation: PACU Anesthesia Type: General Level of consciousness: sedated and patient cooperative Pain management: pain level controlled Vital Signs Assessment: post-procedure vital signs reviewed and stable Respiratory status: spontaneous breathing Cardiovascular status: stable Anesthetic complications: no   No complications documented.  Last Vitals:  Vitals:   11/07/19 1058 11/07/19 1108  BP: (!) 131/45 (!) 144/49  Pulse:  63  Resp: 17 20  Temp: 36.8 C   SpO2: 98% 99%    Last Pain:  Vitals:   11/07/19 1108  TempSrc:   PainSc: 0-No pain                 Nolon Nations

## 2019-11-07 NOTE — Anesthesia Procedure Notes (Signed)
Procedure Name: General with mask airway Date/Time: 11/07/2019 10:50 AM Performed by: Orlie Dakin, CRNA Pre-anesthesia Checklist: Emergency Drugs available, Patient identified, Suction available and Patient being monitored Patient Re-evaluated:Patient Re-evaluated prior to induction Oxygen Delivery Method: Ambu bag Preoxygenation: Pre-oxygenation with 100% oxygen Induction Type: IV induction Placement Confirmation: positive ETCO2

## 2019-11-07 NOTE — Anesthesia Preprocedure Evaluation (Signed)
Anesthesia Evaluation  Patient identified by MRN, date of birth, ID band Patient awake    Reviewed: Allergy & Precautions, NPO status , Patient's Chart, lab work & pertinent test results, reviewed documented beta blocker date and time   History of Anesthesia Complications Negative for: history of anesthetic complications  Airway Mallampati: II  TM Distance: >3 FB Neck ROM: Full    Dental  (+) Dental Advisory Given   Pulmonary sleep apnea ,    breath sounds clear to auscultation       Cardiovascular hypertension, Pt. on medications +CHF  + dysrhythmias Atrial Fibrillation  Rhythm:Irregular Rate:Normal  Echo 10/22/19 1. The left ventricle has normal systolic function with an ejection fraction of 60-65%. The cavity size was normal. There is mild concentric left ventricular hypertrophy. Left ventricular diastolic Doppler parameters are consistent with pseudonormalization. Elevated mean left atrial pressure.  2. The right ventricle has normal systolic function. The cavity was normal. There is no increase in right ventricular wall thickness. Right ventricular systolic pressure is normal with an estimated pressure of 36.9 mmHg.  3. Left atrial size was mildly dilated.  4. There is mild to moderate mitral annular calcification present.  5. The aorta is normal in size and structure. '19 Myoperfusion - Nuclear stress EF: 32%. The study is normal. This is an intermediate risk study. The left ventricular ejection fraction is moderately decreased (30-44%).    '19 TTE - Mild LVH. EF 35%. Diffuse hypokinesis. AV sclerosis without stenosis. Trivial MR. Moderately dilated LA. RV systolic function was mildly reduced. RA was moderately dilated. PA peak pressure: 28 mm Hg   Neuro/Psych negative neurological ROS  negative psych ROS   GI/Hepatic Neg liver ROS,  Celiac disease    Endo/Other  Hypothyroidism   Renal/GU negative Renal ROS   negative genitourinary   Musculoskeletal  (+) Arthritis ,   Abdominal   Peds  Hematology  (+) anemia ,   Anesthesia Other Findings   Reproductive/Obstetrics                             Anesthesia Physical  Anesthesia Plan  ASA: III  Anesthesia Plan: General   Post-op Pain Management:    Induction: Intravenous  PONV Risk Score and Plan: 3 and Treatment may vary due to age or medical condition, Propofol infusion and TIVA  Airway Management Planned: Mask and Natural Airway  Additional Equipment: None  Intra-op Plan:   Post-operative Plan:   Informed Consent: I have reviewed the patients History and Physical, chart, labs and discussed the procedure including the risks, benefits and alternatives for the proposed anesthesia with the patient or authorized representative who has indicated his/her understanding and acceptance.       Plan Discussed with: CRNA and Anesthesiologist  Anesthesia Plan Comments:         Anesthesia Quick Evaluation

## 2019-11-07 NOTE — Interval H&P Note (Signed)
History and Physical Interval Note:  11/07/2019 1:35 PM  Susan Bell  has presented today for surgery, with the diagnosis of AFIB.  The various methods of treatment have been discussed with the patient and family. After consideration of risks, benefits and other options for treatment, the patient has consented to  Procedure(s): CARDIOVERSION (N/A) as a surgical intervention.  The patient's history has been reviewed, patient examined, no change in status, stable for surgery.  I have reviewed the patient's chart and labs.  Questions were answered to the patient's satisfaction.     [employer]

## 2019-11-07 NOTE — Transfer of Care (Signed)
Immediate Anesthesia Transfer of Care Note  Patient: Susan Bell  Procedure(s) Performed: CARDIOVERSION (N/A )  Patient Location: Endoscopy Unit  Anesthesia Type:General  Level of Consciousness: drowsy  Airway & Oxygen Therapy: Patient Spontanous Breathing  Post-op Assessment: Report given to RN and Post -op Vital signs reviewed and stable  Post vital signs: Reviewed and stable  Last Vitals:  Vitals Value Taken Time  BP    Temp    Pulse    Resp    SpO2      Last Pain:  Vitals:   11/07/19 1016  TempSrc: Oral  PainSc: 0-No pain         Complications: No complications documented.

## 2019-11-07 NOTE — CV Procedure (Signed)
    Electrical Cardioversion Procedure Note Susan Bell [id number] 1941-02-16  Procedure: Electrical Cardioversion Indications:  Atrial Fibrillation  Time Out: Verified patient identification, verified procedure,medications/allergies/relevent history reviewed, required imaging and test results available.  Performed  Procedure Details  The patient was NPO after midnight. Anesthesia was administered at the beside  by Dr. Annye Asa with propofol.  Cardioversion was performed with synchronized biphasic defibrillation via AP pads with 200 joules.  1 attempt(s) were performed.  The patient converted to normal sinus rhythm. The patient tolerated the procedure well   IMPRESSION:  Successful cardioversion of atrial fibrillation    Susan Bell 11/07/2019, 10:57 AM

## 2019-11-07 NOTE — Discharge Instructions (Signed)
·   You may have some redness on the skin where the shocks were given. Follow these instructions at home:  Do not drive for 24 hours if you were given a sedative during your procedure.  Take over-the-counter and prescription medicines only as told by your health care provider.  Ask your health care provider how to check your pulse. Check it often.  Rest for 48 hours after the procedure or as told by your health care provider.  Avoid or limit your caffeine use as told by your health care provider.  Keep all follow-up visits as told by your health care provider. This is important. Contact a health care provider if:  You feel like your heart is beating too quickly or your pulse is not regular.  You have a serious muscle cramp that does not go away. Get help right away if:  You have discomfort in your chest.  You are dizzy or you feel faint.  You have trouble breathing or you are short of breath.  Your speech is slurred.  You have trouble moving an arm or leg on one side of your body.  Your fingers or toes turn cold or blue. Summary  Electrical cardioversion is the delivery of a jolt of electricity to restore a normal rhythm to the heart.  This procedure may be done right away in an emergency or may be a scheduled procedure if the condition is not an emergency.  Generally, this is a safe procedure.  After the procedure, check your pulse often as told by your health care provider. This information is not intended to replace advice given to you by your health care provider. Make sure you discuss any questions you have with your health care provider. Document Revised: 10/22/2018 Document Reviewed: 10/22/2018 Elsevier Patient Education  [place].

## 2019-11-14 ENCOUNTER — Other Ambulatory Visit: Payer: Self-pay

## 2019-11-14 ENCOUNTER — Encounter (HOSPITAL_COMMUNITY): Payer: Self-pay | Admitting: Nurse Practitioner

## 2019-11-14 ENCOUNTER — Ambulatory Visit (HOSPITAL_COMMUNITY)
Admission: RE | Admit: 2019-11-14 | Discharge: 2019-11-14 | Disposition: A | Discharge status: Home / Self Care | Payer: Medicare Other | Source: Ambulatory Visit | Attending: Nurse Practitioner | Admitting: Nurse Practitioner

## 2019-11-14 VITALS — BP 110/60 | HR 47 | Ht 66.0 in | Wt 166.8 lb

## 2019-11-14 DIAGNOSIS — I440 Atrioventricular block, first degree: Secondary | ICD-10-CM | POA: Insufficient documentation

## 2019-11-14 DIAGNOSIS — Z79890 Hormone replacement therapy (postmenopausal): Secondary | ICD-10-CM | POA: Insufficient documentation

## 2019-11-14 DIAGNOSIS — I4819 Other persistent atrial fibrillation: Secondary | ICD-10-CM

## 2019-11-14 DIAGNOSIS — Z79899 Other long term (current) drug therapy: Secondary | ICD-10-CM | POA: Insufficient documentation

## 2019-11-14 DIAGNOSIS — G4733 Obstructive sleep apnea (adult) (pediatric): Secondary | ICD-10-CM | POA: Insufficient documentation

## 2019-11-14 DIAGNOSIS — E039 Hypothyroidism, unspecified: Secondary | ICD-10-CM | POA: Insufficient documentation

## 2019-11-14 DIAGNOSIS — I10 Essential (primary) hypertension: Secondary | ICD-10-CM | POA: Insufficient documentation

## 2019-11-14 DIAGNOSIS — Z7901 Long term (current) use of anticoagulants: Secondary | ICD-10-CM | POA: Diagnosis not present

## 2019-11-14 DIAGNOSIS — D6869 Other thrombophilia: Secondary | ICD-10-CM | POA: Diagnosis not present

## 2019-11-14 DIAGNOSIS — I480 Paroxysmal atrial fibrillation: Secondary | ICD-10-CM | POA: Insufficient documentation

## 2019-11-14 DIAGNOSIS — E785 Hyperlipidemia, unspecified: Secondary | ICD-10-CM | POA: Insufficient documentation

## 2019-11-14 NOTE — Interval H&P Note (Signed)
History and Physical Interval Note:  11/14/2019 6:46 AM  Susan Bell  has presented today for surgery, with the diagnosis of AFIB.  The various methods of treatment have been discussed with the patient and family. After consideration of risks, benefits and other options for treatment, the patient has consented to  Procedure(s): CARDIOVERSION (N/A) as a surgical intervention.  The patient's history has been reviewed, patient examined, no change in status, stable for surgery.  I have reviewed the patient's chart and labs.  Questions were answered to the patient's satisfaction.     [employer]

## 2019-11-14 NOTE — Progress Notes (Signed)
Primary Care Physician: Shon Baton, MD Referring Physician: [facility] f/u Cardiologist-  [name]  EP- [name] is a 79 y.o. female with a h/o HLD, hypothyroidism  that was successfully cardioverted in the [facility] 10/26/17 for new onset Afib. She returned to  the afib office 8/2 but had gone back into  afib and was unaware. She was started on BB for better rate control.   She denied tobacco, alcohol, excessive caffeine. Had been told she snored and appeared to stop breathing at night. Sleep study done 8/30 showed OSA. CPAP started. She had an echo that showed EF at 35% with diffuse hypokinesis. Lexi myoview did not show any ischemia but was an intermediate test.  She was already on an ARB/BB.   She remained in afib, rate controlled. She denied any exertional chest pain, is fatigued and has noted shortness of breath more so with steps. She was admitted for Tikosyn.  However after discharge qt prolonged and Tikosyn was stopped.   Since then she has had a long period of time without any afib until this past Monday. No  specific trigger but afib has been persistent since then. She feels some nausea and fatigue while in afib. Afib in the 23 's today on EKG. Echo in 2020 showed normalization of EF.   F/u afib clinic 11/14/19, following successful cardioversion. ekg today shows  Sinus brady . She feels well, improved in SR. She has had her covid shots a while back.   Today, she denies symptoms of palpitations, chest pain, shortness of breath, orthopnea, PND, lower extremity edema, dizziness, presyncope, syncope, or neurologic sequela. The patient is tolerating medications without difficulties and is otherwise without complaint today.   Past Medical History:  Diagnosis Date  . Allergy    seasonal  . Anemia   . Arthritis   . Cataract    bilateral removed  . Celiac disease   . Eczema   . H/O transfusion of platelets   . Hyperlipidemia   . Hypothyroidism   . OSA  (obstructive sleep apnea)   . Osteopenia   . Paroxysmal atrial fibrillation (HCC)   . Shingles   . Vitamin D deficiency    Past Surgical History:  Procedure Laterality Date  . CARDIOVERSION N/A 12/14/2017   Procedure: CARDIOVERSION;  Surgeon: Sanda Klein, MD;  Location: [facility] ENDOSCOPY;  Service: Cardiovascular;  Laterality: N/A;  . CARDIOVERSION N/A 11/07/2019   Procedure: CARDIOVERSION;  Surgeon: Jerline Pain, MD;  Location: [facility] ENDOSCOPY;  Service: Cardiovascular;  Laterality: N/A;  . CATARACT EXTRACTION     bilateral  . COLONOSCOPY    . DILATION AND CURETTAGE OF UTERUS    . UPPER GI ENDOSCOPY      Current Outpatient Medications  Medication Sig Dispense Refill  . acetaminophen (TYLENOL) 500 MG tablet Take 1,000-1,500 mg by mouth 2 (two) times daily as needed for moderate pain or headache.    . alendronate (FOSAMAX) 70 MG tablet Take 70 mg by mouth every Tuesday.     Marland Kitchen amLODipine (NORVASC) 5 MG tablet TAKE 1 TABLET BY MOUTH EVERY DAY (Patient taking differently: Take 5 mg by mouth daily. ) 90 tablet 2  . Calcium Carbonate-Vitamin D (CALCIUM 600+D PO) Take 1 tablet by mouth daily.     . cholecalciferol (VITAMIN D) 400 units TABS tablet Take 400 Units by mouth daily at 12 noon.    . dorzolamide-timolol (COSOPT) 22.3-6.8 MG/ML ophthalmic solution Place 1 drop into the  left eye 2 (two) times daily.    Marland Kitchen ELIQUIS 5 MG TABS tablet TAKE 1 TABLET TWICE A DAY (Patient taking differently: Take 5 mg by mouth 2 (two) times daily. ) 180 tablet 1  . gabapentin (NEURONTIN) 100 MG capsule Take 100 mg by mouth at bedtime.    Marland Kitchen levothyroxine (LEVOTHROID) 88 MCG tablet Take 88 mcg by mouth daily.      Marland Kitchen loratadine (CLARITIN) 10 MG tablet Take 10 mg by mouth every evening.     Marland Kitchen losartan (COZAAR) 50 MG tablet TAKE 1 TABLET BY MOUTH EVERY DAY (Patient taking differently: Take 50 mg by mouth daily. ) 90 tablet 3  . metoprolol succinate (TOPROL-XL) 50 MG 24 hr tablet TAKE 1 TABLET BY MOUTH DAILY. TAKE WITH  OR IMMEDIATELY FOLLOWING A MEAL. (Patient taking differently: Take 25-50 mg by mouth See admin instructions. Take 50 mg every morning. Take an additional 25 mg dose at bedtime until 5/4.) 90 tablet 2  . Multiple Vitamin (MULTIVITAMIN WITH MINERALS) TABS tablet Take 1 tablet by mouth daily.    Marland Kitchen omeprazole (PRILOSEC) 20 MG capsule Take 20 mg by mouth at bedtime.    Marland Kitchen oxybutynin (DITROPAN) 5 MG tablet Take 2.5 mg every evening by mouth.    Marland Kitchen PATADAY 0.2 % SOLN Place 1 drop into both eyes daily as needed (allergies).     . prednisoLONE acetate (PRED FORTE) 1 % ophthalmic suspension Place 1 drop into the left eye daily.     . rosuvastatin (CRESTOR) 5 MG tablet Take 5 mg by mouth every evening.   3  . SYSTANE ULTRA 0.4-0.3 % SOLN Place 1 drop into both eyes daily as needed (dry eyes).     . vitamin C (ASCORBIC ACID) 250 MG tablet Take 500 mg by mouth daily.     No current facility-administered medications for this encounter.    Allergies  Allergen Reactions  . Betadine [Povidone Iodine] Itching    With prolonged usage   . Tommas Olp Antonieta Pert Polysaccharide]     Severe constipation     Social History   Socioeconomic History  . Marital status: Married    Spouse name: Not on file  . Number of children: 4  . Years of education: Not on file  . Highest education level: Not on file  Occupational History  . Occupation: retired    Fish farm manager: RETIRED  Tobacco Use  . Smoking status: Never Smoker  . Smokeless tobacco: Never Used  Vaping Use  . Vaping Use: Never used  Substance and Sexual Activity  . Alcohol use: No    Alcohol/week: 0.0 standard drinks  . Drug use: No  . Sexual activity: Not on file  Other Topics Concern  . Not on file  Social History Narrative   Daily caffeine   Social Determinants of Health   Financial Resource Strain:   . Difficulty of Paying Living Expenses:   Food Insecurity:   . Worried About Charity fundraiser in the Last Year:   . Arboriculturist in the Last Year:    Transportation Needs:   . Film/video editor (Medical):   Marland Kitchen Lack of Transportation (Non-Medical):   Physical Activity:   . Days of Exercise per Week:   . Minutes of Exercise per Session:   Stress:   . Feeling of Stress :   Social Connections:   . Frequency of Communication with Friends and Family:   . Frequency of Social Gatherings with Friends and Family:   .  Attends Religious Services:   . Active Member of Clubs or Organizations:   . Attends Archivist Meetings:   Marland Kitchen Marital Status:   Intimate Partner Violence:   . Fear of Current or Ex-Partner:   . Emotionally Abused:   Marland Kitchen Physically Abused:   . Sexually Abused:     Family History  Problem Relation Age of Onset  . Liver cancer Mother   . Diabetes Father   . Heart disease Father   . Colon cancer Neg Hx     ROS- All systems are reviewed and negative except as per the HPI above  Physical Exam: There were no vitals filed for this visit. Wt Readings from Last 3 Encounters:  11/07/19 75.8 kg  11/04/19 75.8 kg  11/01/19 76 kg    Labs: Lab Results  Component Value Date   NA 133 (L) 11/04/2019   K 4.7 11/04/2019   CL 101 11/04/2019   CO2 25 11/04/2019   GLUCOSE 96 11/04/2019   BUN 15 11/04/2019   CREATININE 0.86 11/04/2019   CALCIUM 9.0 11/04/2019   MG 2.0 12/25/2017   No results found for: INR No results found for: CHOL, HDL, LDLCALC, TRIG   GEN- The patient is well appearing, alert and oriented x 3 today.   Head- normocephalic, atraumatic Eyes-  Sclera clear, conjunctiva pink Ears- hearing intact Oropharynx- clear Neck- supple, no JVP Lymph- no cervical lymphadenopathy Lungs- Clear to ausculation bilaterally, normal work of breathing Heart- irregular rate and rhythm, no murmurs, rubs or gallops, PMI not laterally displaced GI- soft, NT, ND, + BS Extremities- no clubbing, cyanosis, or edema MS- no significant deformity or atrophy Skin- no rash or lesion Psych- euthymic mood, full  affect Neuro- strength and sensation are intact  EKG- sinus rhythm with first degree block, at 47 bpm, pt states heart rates at home in the  50  to 60 range  Epic records reviewed Echo- 1. The left ventricle has normal systolic function with an ejection  fraction of 60-65%. The cavity size was normal. There is mild concentric  left ventricular hypertrophy. Left ventricular diastolic Doppler  parameters are consistent with  pseudonormalization. Elevated mean left atrial pressure.  2. The right ventricle has normal systolic function. The cavity was  normal. There is no increase in right ventricular wall thickness. Right  ventricular systolic pressure is normal with an estimated pressure of 36.9  mmHg.  3. Left atrial size was mildly dilated.  4. There is mild to moderate mitral annular calcification present.  5. The aorta is normal in size and structure.    Assessment and Plan: 1. Paroxysmal afib She has been  maintaining SR for several years  No trigger identified Recent breakthrough afib but successful cardioversion and remains in SR today Continue  metoprolol succinate 50 mg daily, HR in the 40's here,  not symptomatic with this, at home 50 to 60 range  Continue CPAP She has had both covid shots   2. Chadsvasc score of  3 Continue eliquis 5 mg bid   3. HTN Stable    F/u with Dr. Percival Spanish as per recall in January  afib clinic as needed     Geroge Baseman. Edelyn Heidel, [facility] [address] [place], [place] [address] [phone]

## 2019-12-17 DIAGNOSIS — I110 Hypertensive heart disease with heart failure: Secondary | ICD-10-CM | POA: Diagnosis not present

## 2019-12-17 DIAGNOSIS — D692 Other nonthrombocytopenic purpura: Secondary | ICD-10-CM | POA: Diagnosis not present

## 2019-12-17 DIAGNOSIS — I480 Paroxysmal atrial fibrillation: Secondary | ICD-10-CM | POA: Diagnosis not present

## 2019-12-17 DIAGNOSIS — Z23 Encounter for immunization: Secondary | ICD-10-CM | POA: Diagnosis not present

## 2019-12-17 DIAGNOSIS — E7849 Other hyperlipidemia: Secondary | ICD-10-CM | POA: Diagnosis not present

## 2019-12-17 DIAGNOSIS — I5022 Chronic systolic (congestive) heart failure: Secondary | ICD-10-CM | POA: Diagnosis not present

## 2019-12-17 DIAGNOSIS — I2510 Atherosclerotic heart disease of native coronary artery without angina pectoris: Secondary | ICD-10-CM | POA: Diagnosis not present

## 2019-12-17 DIAGNOSIS — M545 Low back pain: Secondary | ICD-10-CM | POA: Diagnosis not present

## 2019-12-17 DIAGNOSIS — M1990 Unspecified osteoarthritis, unspecified site: Secondary | ICD-10-CM | POA: Diagnosis not present

## 2019-12-17 DIAGNOSIS — D649 Anemia, unspecified: Secondary | ICD-10-CM | POA: Diagnosis not present

## 2019-12-17 DIAGNOSIS — Z7901 Long term (current) use of anticoagulants: Secondary | ICD-10-CM | POA: Diagnosis not present

## 2019-12-17 DIAGNOSIS — E039 Hypothyroidism, unspecified: Secondary | ICD-10-CM | POA: Diagnosis not present

## 2019-12-17 DIAGNOSIS — I2584 Coronary atherosclerosis due to calcified coronary lesion: Secondary | ICD-10-CM | POA: Diagnosis not present

## 2019-12-23 ENCOUNTER — Telehealth (HOSPITAL_COMMUNITY): Payer: Self-pay | Admitting: *Deleted

## 2019-12-23 DIAGNOSIS — R609 Edema, unspecified: Secondary | ICD-10-CM

## 2019-12-23 MED ORDER — AMLODIPINE BESYLATE 5 MG PO TABS: 2.5000 mg | ORAL_TABLET | Freq: Every day | ORAL | 2 refills | Status: DC

## 2019-12-23 NOTE — Telephone Encounter (Signed)
Pt having issues with lower extremity swelling it improves overnight but comes back midday each day. She tried compression stocking but this made her toes hurt. Discussed with Roderic Palau NP will try decrease amlodipine to 2.35m once a day to see if improvement will call later in week with update. Pt in agreement.

## 2019-12-31 DIAGNOSIS — Z85828 Personal history of other malignant neoplasm of skin: Secondary | ICD-10-CM | POA: Diagnosis not present

## 2019-12-31 DIAGNOSIS — L820 Inflamed seborrheic keratosis: Secondary | ICD-10-CM | POA: Diagnosis not present

## 2019-12-31 DIAGNOSIS — L720 Epidermal cyst: Secondary | ICD-10-CM | POA: Diagnosis not present

## 2019-12-31 DIAGNOSIS — D692 Other nonthrombocytopenic purpura: Secondary | ICD-10-CM | POA: Diagnosis not present

## 2019-12-31 DIAGNOSIS — L821 Other seborrheic keratosis: Secondary | ICD-10-CM | POA: Diagnosis not present

## 2020-01-01 NOTE — Telephone Encounter (Signed)
Pt continues with swelling mainly of left leg. Not much difference with decreasing amlodipine. Per Roderic Palau NP will get [place] of lower extremity and bring in for assessment. Pt in agreement.

## 2020-01-02 ENCOUNTER — Other Ambulatory Visit: Payer: Self-pay

## 2020-01-02 ENCOUNTER — Ambulatory Visit (HOSPITAL_BASED_OUTPATIENT_CLINIC_OR_DEPARTMENT_OTHER)
Admission: RE | Admit: 2020-01-02 | Discharge: 2020-01-02 | Disposition: A | Discharge status: Home / Self Care | Payer: Medicare Other | Source: Ambulatory Visit | Attending: Nurse Practitioner | Admitting: Nurse Practitioner

## 2020-01-02 ENCOUNTER — Ambulatory Visit (HOSPITAL_COMMUNITY)
Admission: RE | Admit: 2020-01-02 | Discharge: 2020-01-02 | Disposition: A | Discharge status: Home / Self Care | Payer: Medicare Other | Source: Ambulatory Visit | Attending: Nurse Practitioner | Admitting: Nurse Practitioner

## 2020-01-02 ENCOUNTER — Encounter (HOSPITAL_COMMUNITY): Payer: Self-pay | Admitting: Nurse Practitioner

## 2020-01-02 VITALS — BP 180/72 | HR 48 | Ht 66.0 in | Wt 167.8 lb

## 2020-01-02 DIAGNOSIS — M7989 Other specified soft tissue disorders: Secondary | ICD-10-CM | POA: Insufficient documentation

## 2020-01-02 DIAGNOSIS — I480 Paroxysmal atrial fibrillation: Secondary | ICD-10-CM | POA: Diagnosis not present

## 2020-01-02 DIAGNOSIS — D6869 Other thrombophilia: Secondary | ICD-10-CM | POA: Insufficient documentation

## 2020-01-02 DIAGNOSIS — R609 Edema, unspecified: Secondary | ICD-10-CM | POA: Diagnosis not present

## 2020-01-02 NOTE — Progress Notes (Signed)
VASCULAR LAB    Left lower extremity venous duplex has been performed.  See CV proc for preliminary results.  Messaged Roderic Palau, NP, with results   Mauro Kaufmann, Hanan Mcwilliams, RVT 01/02/2020, 9:29 AM

## 2020-01-02 NOTE — Progress Notes (Signed)
Primary Care Physician: Shon Baton, MD Referring Physician: [facility] f/u Cardiologist-  [name]  EP- [name] is a 79 y.o. female with a h/o HLD, hypothyroidism  that was successfully cardioverted in the [facility] 10/26/17 for new onset Afib. She returned to  the afib office 8/2 but had gone back into  afib and was unaware. She was started on BB for better rate control.   She denied tobacco, alcohol, excessive caffeine. Had been told she snored and appeared to stop breathing at night. Sleep study done 8/30 showed OSA. CPAP started. She had an echo that showed EF at 35% with diffuse hypokinesis. Lexi myoview did not show any ischemia but was an intermediate test.  She was already on an ARB/BB.   She remained in afib, rate controlled. She denied any exertional chest pain, is fatigued and has noted shortness of breath more so with steps. She was admitted for Tikosyn.  However after discharge qt prolonged and Tikosyn was stopped.   Since then she has had a long period of time without any afib until this past Monday. No  specific trigger but afib has been persistent since then. She feels some nausea and fatigue while in afib. Afib in the 51 's today on EKG. Echo in 2020 showed normalization of EF.   F/u afib clinic 11/14/19, following successful cardioversion. ekg today shows  Sinus brady . She feels well, improved in SR. She has had her covid shots a while back.   F/u in afib clinic, 01/02/20. She called to the office last week c/o left leg swelling since August. Rt leg no swelling. Reduced her amlodipine for a few days with mild improvement but still swelling. I scheduled her for an Venous U/S this am which was negative for DVT. Minimal swelling this am. Usually  gets more swelling  during the day and down by the next am. She does wear intermittent support socks. Tries to minimize salt. No pain  associated with the swelling. 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 The U/S   technician mentioned to her that she could see evidence of  incompetent veins.   BP elevated on presentation this am, usually well controlled. On recheck improved to 150/80. She will check again when she gets home.  Today, she denies symptoms of palpitations, chest pain, shortness of breath, orthopnea, PND, lower extremity edema, dizziness, presyncope, syncope, or neurologic sequela. The patient is tolerating medications without difficulties and is otherwise without complaint today.   Past Medical History:  Diagnosis Date  . Allergy    seasonal  . Anemia   . Arthritis   . Cataract    bilateral removed  . Celiac disease   . Eczema   . H/O transfusion of platelets   . Hyperlipidemia   . Hypothyroidism   . OSA (obstructive sleep apnea)   . Osteopenia   . Paroxysmal atrial fibrillation (HCC)   . Shingles   . Vitamin D deficiency    Past Surgical History:  Procedure Laterality Date  . CARDIOVERSION N/A 12/14/2017   Procedure: CARDIOVERSION;  Surgeon: Sanda Klein, MD;  Location: [facility] ENDOSCOPY;  Service: Cardiovascular;  Laterality: N/A;  . CARDIOVERSION N/A 11/07/2019   Procedure: CARDIOVERSION;  Surgeon: Jerline Pain, MD;  Location: [facility] ENDOSCOPY;  Service: Cardiovascular;  Laterality: N/A;  . CATARACT EXTRACTION     bilateral  . COLONOSCOPY    . DILATION AND CURETTAGE OF UTERUS    .  UPPER GI ENDOSCOPY      Current Outpatient Medications  Medication Sig Dispense Refill  . acetaminophen (TYLENOL) 500 MG tablet Take 1,000-1,500 mg by mouth 2 (two) times daily as needed for moderate pain or headache.    . alendronate (FOSAMAX) 70 MG tablet Take 70 mg by mouth every Tuesday.     Marland Kitchen amLODipine (NORVASC) 5 MG tablet Take 0.5 tablets (2.5 mg total) by mouth daily. 90 tablet 2  . Calcium Carbonate-Vitamin D (CALCIUM 600+D PO) Take 1 tablet by mouth daily.     . cholecalciferol (VITAMIN D) 400 units TABS tablet Take 400 Units by mouth daily at 12 noon.    . dorzolamide-timolol (COSOPT)  22.3-6.8 MG/ML ophthalmic solution Place 1 drop into the left eye 2 (two) times daily.    Marland Kitchen ELIQUIS 5 MG TABS tablet TAKE 1 TABLET TWICE A DAY (Patient taking differently: Take 5 mg by mouth 2 (two) times daily. ) 180 tablet 1  . gabapentin (NEURONTIN) 100 MG capsule Take 100 mg by mouth at bedtime.    Marland Kitchen levothyroxine (LEVOTHROID) 88 MCG tablet Take 88 mcg by mouth daily.      Marland Kitchen loratadine (CLARITIN) 10 MG tablet Take 10 mg by mouth every evening.     Marland Kitchen losartan (COZAAR) 50 MG tablet TAKE 1 TABLET BY MOUTH EVERY DAY (Patient taking differently: Take 50 mg by mouth daily. ) 90 tablet 3  . metoprolol succinate (TOPROL-XL) 50 MG 24 hr tablet Take 50 mg by mouth daily. Take with or immediately following a meal.    . Multiple Vitamin (MULTIVITAMIN WITH MINERALS) TABS tablet Take 1 tablet by mouth daily.    Marland Kitchen omeprazole (PRILOSEC) 20 MG capsule Take 20 mg by mouth at bedtime.    Marland Kitchen oxybutynin (DITROPAN) 5 MG tablet Take 2.5 mg every evening by mouth.    Marland Kitchen PATADAY 0.2 % SOLN Place 1 drop into both eyes daily as needed (allergies).     . prednisoLONE acetate (PRED FORTE) 1 % ophthalmic suspension Place 1 drop into the left eye daily.     . rosuvastatin (CRESTOR) 5 MG tablet Take 5 mg by mouth every evening.   3  . SYSTANE ULTRA 0.4-0.3 % SOLN Place 1 drop into both eyes daily as needed (dry eyes).     . vitamin C (ASCORBIC ACID) 250 MG tablet Take 500 mg by mouth daily.     No current facility-administered medications for this encounter.    Allergies  Allergen Reactions  . Betadine [Povidone Iodine] Itching    With prolonged usage   . Tommas Olp Antonieta Pert Polysaccharide]     Severe constipation     Social History   Socioeconomic History  . Marital status: Married    Spouse name: Not on file  . Number of children: 4  . Years of education: Not on file  . Highest education level: Not on file  Occupational History  . Occupation: retired    Fish farm manager: RETIRED  Tobacco Use  . Smoking status: Never  Smoker  . Smokeless tobacco: Never Used  Vaping Use  . Vaping Use: Never used  Substance and Sexual Activity  . Alcohol use: No    Alcohol/week: 0.0 standard drinks  . Drug use: No  . Sexual activity: Not on file  Other Topics Concern  . Not on file  Social History Narrative   Daily caffeine   Social Determinants of Health   Financial Resource Strain:   . Difficulty of Paying Living Expenses: Not on  file  Food Insecurity:   . Worried About Charity fundraiser in the Last Year: Not on file  . Ran Out of Food in the Last Year: Not on file  Transportation Needs:   . Lack of Transportation (Medical): Not on file  . Lack of Transportation (Non-Medical): Not on file  Physical Activity:   . Days of Exercise per Week: Not on file  . Minutes of Exercise per Session: Not on file  Stress:   . Feeling of Stress : Not on file  Social Connections:   . Frequency of Communication with Friends and Family: Not on file  . Frequency of Social Gatherings with Friends and Family: Not on file  . Attends Religious Services: Not on file  . Active Member of Clubs or Organizations: Not on file  . Attends Archivist Meetings: Not on file  . Marital Status: Not on file  Intimate Partner Violence:   . Fear of Current or Ex-Partner: Not on file  . Emotionally Abused: Not on file  . Physically Abused: Not on file  . Sexually Abused: Not on file    Family History  Problem Relation Age of Onset  . Liver cancer Mother   . Diabetes Father   . Heart disease Father   . Colon cancer Neg Hx     ROS- All systems are reviewed and negative except as per the HPI above  Physical Exam: Vitals:   01/02/20 0927  BP: (!) 180/72  Pulse: (!) 48  Weight: 76.1 kg  Height: 5' 6"  (1.676 m)   Wt Readings from Last 3 Encounters:  01/02/20 76.1 kg  11/14/19 75.7 kg  11/07/19 75.8 kg    Labs: Lab Results  Component Value Date   NA 133 (L) 11/04/2019   K 4.7 11/04/2019   CL 101 11/04/2019    CO2 25 11/04/2019   GLUCOSE 96 11/04/2019   BUN 15 11/04/2019   CREATININE 0.86 11/04/2019   CALCIUM 9.0 11/04/2019   MG 2.0 12/25/2017   No results found for: INR No results found for: CHOL, HDL, LDLCALC, TRIG   GEN- The patient is well appearing, alert and oriented x 3 today.   Head- normocephalic, atraumatic Eyes-  Sclera clear, conjunctiva pink Ears- hearing intact Oropharynx- clear Neck- supple, no JVP Lymph- no cervical lymphadenopathy Lungs- Clear to ausculation bilaterally, normal work of breathing Heart- regular rate and rhythm, no murmurs, rubs or gallops, PMI not laterally displaced GI- soft, NT, ND, + BS Extremities- no clubbing, cyanosis, or edema, + for varicose veins  MS- no significant deformity or atrophy Skin- no rash or lesion Psych- euthymic mood, full affect Neuro- strength and sensation are intact  EKG- sinus rhythm with first degree block, at 48 bpm, pt states heart rates at home in the  50  to 60 range  Epic records reviewed Echo- 1. The left ventricle has normal systolic function with an ejection  fraction of 60-65%. The cavity size was normal. There is mild concentric  left ventricular hypertrophy. Left ventricular diastolic Doppler  parameters are consistent with  pseudonormalization. Elevated mean left atrial pressure.  2. The right ventricle has normal systolic function. The cavity was  normal. There is no increase in right ventricular wall thickness. Right  ventricular systolic pressure is normal with an estimated pressure of 36.9  mmHg.  3. Left atrial size was mildly dilated.  4. There is mild to moderate mitral annular calcification present.  5. The aorta is normal in size  and structure.  DVT scan this am - Negative for DVT  Assessment and Plan: 1. Paroxysmal afib Maintaining SR for  Continue  metoprolol succinate 50 mg daily, HR in the 40's here,  not symptomatic with this, at home 50 to 60 range  Continue CPAP She has had  covid  shots   2. Chadsvasc score of  3 Continue eliquis 5 mg bid   3. HTN Elevated  today Usually  stable at home  She will recheck on arrival home    4. Swelling of left leg for 1-2 months  Decreasing amlodipine did not resolve swelling, back on usual dose  No DVT seen on U/S today I can see ropey  VV of both extremities and technician remarked to pt she could see some incompetent valves  I will refer to a vein specialist   F/u with Dr. Percival Spanish as per recall in January  afib clinic as needed     [place]. Susan Bell, [facility] [address] [place], [place] [address] [phone]

## 2020-01-08 DIAGNOSIS — Z23 Encounter for immunization: Secondary | ICD-10-CM | POA: Diagnosis not present

## 2020-01-21 DIAGNOSIS — M170 Bilateral primary osteoarthritis of knee: Secondary | ICD-10-CM | POA: Diagnosis not present

## 2020-01-27 ENCOUNTER — Other Ambulatory Visit: Payer: Self-pay

## 2020-01-27 DIAGNOSIS — M7989 Other specified soft tissue disorders: Secondary | ICD-10-CM

## 2020-01-28 DIAGNOSIS — M25561 Pain in right knee: Secondary | ICD-10-CM | POA: Diagnosis not present

## 2020-01-28 DIAGNOSIS — M25562 Pain in left knee: Secondary | ICD-10-CM | POA: Diagnosis not present

## 2020-01-28 DIAGNOSIS — M170 Bilateral primary osteoarthritis of knee: Secondary | ICD-10-CM | POA: Diagnosis not present

## 2020-01-28 DIAGNOSIS — M25512 Pain in left shoulder: Secondary | ICD-10-CM | POA: Diagnosis not present

## 2020-01-30 ENCOUNTER — Ambulatory Visit (INDEPENDENT_AMBULATORY_CARE_PROVIDER_SITE_OTHER): Payer: Medicare Other | Admitting: Physician Assistant

## 2020-01-30 ENCOUNTER — Ambulatory Visit (HOSPITAL_COMMUNITY)
Admission: RE | Admit: 2020-01-30 | Discharge: 2020-01-30 | Disposition: A | Discharge status: Home / Self Care | Payer: Medicare Other | Source: Ambulatory Visit | Attending: Physician Assistant | Admitting: Physician Assistant

## 2020-01-30 ENCOUNTER — Other Ambulatory Visit: Payer: Self-pay

## 2020-01-30 VITALS — BP 152/62 | HR 50 | Temp 98.4°F | Resp 20 | Ht 66.0 in | Wt 165.3 lb

## 2020-01-30 DIAGNOSIS — M7989 Other specified soft tissue disorders: Secondary | ICD-10-CM

## 2020-01-30 DIAGNOSIS — I8393 Asymptomatic varicose veins of bilateral lower extremities: Secondary | ICD-10-CM | POA: Diagnosis not present

## 2020-01-30 DIAGNOSIS — I872 Venous insufficiency (chronic) (peripheral): Secondary | ICD-10-CM | POA: Diagnosis not present

## 2020-01-30 NOTE — Progress Notes (Signed)
Requested by:  Shon Baton, MD [address] [place],  [place] [phone]  Reason for consultation: LLE swelling    History of Present Illness   Susan Bell is a 79 y.o. (1940/07/18) female who presents for evaluation of left lower extremity swelling particularly of her left ankle for approximately 3 months.  She states the swelling is worse in the evening and causes stiffness with flexion and extension.  She was recently placed on a steroid dose pack for back pain with improvement in the swelling. She states she did notice more frequent urination with the steroid.  She denies claudication or rest pain.  She has a history of a. Fib on Eliquis. She in not diabetic.  Venous symptoms include: positive if (X) [  ] aching [  ] heavy [  ] tired  [  ] throbbing [  ] burning  [  ] itching [ x ]swelling [  ] bleeding [  ] ulcer Onset/duration:  2-3 months  Occupation:  Retired Aggravating factors: standing Alleviating factors: elevation Compression:  yes Helps:  no Pain medications:  None (gabapentin for neuropathy of feet) Previous vein procedures:  None History of DVT:  No  Past Medical History:  Diagnosis Date  . Allergy    seasonal  . Anemia   . Arthritis   . Cataract    bilateral removed  . Celiac disease   . Eczema   . H/O transfusion of platelets   . Hyperlipidemia   . Hypothyroidism   . OSA (obstructive sleep apnea)   . Osteopenia   . Paroxysmal atrial fibrillation (HCC)   . Shingles   . Vitamin D deficiency     Past Surgical History:  Procedure Laterality Date  . CARDIOVERSION N/A 12/14/2017   Procedure: CARDIOVERSION;  Surgeon: Sanda Klein, MD;  Location: [facility] ENDOSCOPY;  Service: Cardiovascular;  Laterality: N/A;  . CARDIOVERSION N/A 11/07/2019   Procedure: CARDIOVERSION;  Surgeon: Jerline Pain, MD;  Location: [facility] ENDOSCOPY;  Service: Cardiovascular;  Laterality: N/A;  . CATARACT EXTRACTION     bilateral  . COLONOSCOPY    . DILATION AND  CURETTAGE OF UTERUS    . UPPER GI ENDOSCOPY      Social History   Socioeconomic History  . Marital status: Married    Spouse name: Not on file  . Number of children: 4  . Years of education: Not on file  . Highest education level: Not on file  Occupational History  . Occupation: retired    Fish farm manager: RETIRED  Tobacco Use  . Smoking status: Never Smoker  . Smokeless tobacco: Never Used  Vaping Use  . Vaping Use: Never used  Substance and Sexual Activity  . Alcohol use: No    Alcohol/week: 0.0 standard drinks  . Drug use: No  . Sexual activity: Not on file  Other Topics Concern  . Not on file  Social History Narrative   Daily caffeine   Social Determinants of Health   Financial Resource Strain:   . Difficulty of Paying Living Expenses: Not on file  Food Insecurity:   . Worried About Charity fundraiser in the Last Year: Not on file  . Ran Out of Food in the Last Year: Not on file  Transportation Needs:   . Lack of Transportation (Medical): Not on file  . Lack of Transportation (Non-Medical): Not on file  Physical Activity:   . Days of Exercise per Week: Not on file  . Minutes of Exercise  per Session: Not on file  Stress:   . Feeling of Stress : Not on file  Social Connections:   . Frequency of Communication with Friends and Family: Not on file  . Frequency of Social Gatherings with Friends and Family: Not on file  . Attends Religious Services: Not on file  . Active Member of Clubs or Organizations: Not on file  . Attends Archivist Meetings: Not on file  . Marital Status: Not on file  Intimate Partner Violence:   . Fear of Current or Ex-Partner: Not on file  . Emotionally Abused: Not on file  . Physically Abused: Not on file  . Sexually Abused: Not on file    Family History  Problem Relation Age of Onset  . Liver cancer Mother   . Diabetes Father   . Heart disease Father   . Colon cancer Neg Hx     Current Outpatient Medications  Medication  Sig Dispense Refill  . acetaminophen (TYLENOL) 500 MG tablet Take 1,000-1,500 mg by mouth 2 (two) times daily as needed for moderate pain or headache.    . alendronate (FOSAMAX) 70 MG tablet Take 70 mg by mouth every Tuesday.     Marland Kitchen amLODipine (NORVASC) 5 MG tablet Take 0.5 tablets (2.5 mg total) by mouth daily. 90 tablet 2  . Calcium Carbonate-Vitamin D (CALCIUM 600+D PO) Take 1 tablet by mouth daily.     . cholecalciferol (VITAMIN D) 400 units TABS tablet Take 400 Units by mouth daily at 12 noon.    . dorzolamide-timolol (COSOPT) 22.3-6.8 MG/ML ophthalmic solution Place 1 drop into the left eye 2 (two) times daily.    Marland Kitchen ELIQUIS 5 MG TABS tablet TAKE 1 TABLET TWICE A DAY (Patient taking differently: Take 5 mg by mouth 2 (two) times daily. ) 180 tablet 1  . gabapentin (NEURONTIN) 100 MG capsule Take 100 mg by mouth at bedtime.    Marland Kitchen levothyroxine (LEVOTHROID) 88 MCG tablet Take 88 mcg by mouth daily.      Marland Kitchen loratadine (CLARITIN) 10 MG tablet Take 10 mg by mouth every evening.     Marland Kitchen losartan (COZAAR) 50 MG tablet TAKE 1 TABLET BY MOUTH EVERY DAY (Patient taking differently: Take 50 mg by mouth daily. ) 90 tablet 3  . metoprolol succinate (TOPROL-XL) 50 MG 24 hr tablet Take 50 mg by mouth daily. Take with or immediately following a meal.    . Multiple Vitamin (MULTIVITAMIN WITH MINERALS) TABS tablet Take 1 tablet by mouth daily.    Marland Kitchen omeprazole (PRILOSEC) 20 MG capsule Take 20 mg by mouth at bedtime.    Marland Kitchen oxybutynin (DITROPAN) 5 MG tablet Take 2.5 mg every evening by mouth.    Marland Kitchen PATADAY 0.2 % SOLN Place 1 drop into both eyes daily as needed (allergies).     . prednisoLONE acetate (PRED FORTE) 1 % ophthalmic suspension Place 1 drop into the left eye daily.     . rosuvastatin (CRESTOR) 5 MG tablet Take 5 mg by mouth every evening.   3  . SYSTANE ULTRA 0.4-0.3 % SOLN Place 1 drop into both eyes daily as needed (dry eyes).     . vitamin C (ASCORBIC ACID) 250 MG tablet Take 500 mg by mouth daily.     No  current facility-administered medications for this visit.    Allergies  Allergen Reactions  . Betadine [Povidone Iodine] Itching    With prolonged usage   . Tommas Olp Antonieta Pert Polysaccharide]     Severe constipation  REVIEW OF SYSTEMS (negative unless checked):   Cardiac:  []  Chest pain or chest pressure? []  Shortness of breath upon activity? []  Shortness of breath when lying flat? [x]  Irregular heart rhythm?  Vascular:  []  Pain in calf, thigh, or hip brought on by walking? []  Pain in feet at night that wakes you up from your sleep? []  Blood clot in your veins? [x]  Leg swelling?  Pulmonary:  []  Oxygen at home? []  Productive cough? []  Wheezing?  Neurologic:  []  Sudden weakness in arms or legs? []  Sudden numbness in arms or legs? []  Sudden onset of difficult speaking or slurred speech? []  Temporary loss of vision in one eye? []  Problems with dizziness?  Gastrointestinal:  []  Blood in stool? []  Vomited blood?  Genitourinary:  []  Burning when urinating? []  Blood in urine?  Psychiatric:  []  Major depression  Hematologic:  []  Bleeding problems? []  Problems with blood clotting?  Dermatologic:  []  Rashes or ulcers?  Constitutional:  []  Fever or chills?  Ear/Nose/Throat:  []  Change in hearing? []  Nose bleeds? []  Sore throat?  Musculoskeletal:  []  Back pain? []  Joint pain? []  Muscle pain?   Physical Examination     Vitals:   01/30/20 1246  BP: (!) 152/62  Pulse: (!) 50  Resp: 20  Temp: 98.4 F (36.9 C)  SpO2: 98%   General:  WDWN in NAD; vital signs documented above Gait: unaided, mild ataxia HENT: WNL, normocephalic Pulmonary: normal non-labored breathing , without Rales, rhonchi,  wheezing Cardiac: regular HR, without  Murmurs without carotid bruits Abdomen: soft, NT, no masses Skin: without rashes Vascular Exam/Pulses:  Right Left  Radial 2+ (normal) 2+ (normal)  Ulnar Not eval Not eval  Femoral Not eval Not eval  Popliteal Not palp  Not palp  DP 2+ (normal) 2+ (normal)  PT Not palp Not palp   Extremities: with varicose veins, with reticular veins, without edema, without stasis pigmentation, without lipodermatosclerosis, without ulcers Musculoskeletal: no muscle wasting or atrophy  Neurologic: A&O X 3;  No focal weakness or paresthesias are detected Psychiatric:  The pt has Normal affect.  Non-invasive Vascular Imaging   LLE Venous Insufficiency Duplex (01/30/2020):   LLE:  negative DVT and SVT,   positive GSV reflux at proximal thigh and SFJ,   GSV diameter 0.22-0.37 cm  negative SSV reflux ,  positive deep venous reflux  Positive AASV reflux; diameter 0.22 cm  Left:  - No evidence of deep vein thrombosis seen in the left lower extremity,  from the common femoral through the popliteal veins.  - No evidence of superficial venous reflux seen in the left short  saphenous vein.  - Venous reflux is noted in the left common femoral vein.  - Venous reflux is noted in the left greater saphenous vein in the  proximal thigh.  - Venous reflux is noted in the left popliteal vein.  - Venous reflux is noted in the AASV proximally    Lower extremity venous duplex 01/02/2020 RIGHT: - No evidence of common femoral vein obstruction. LEFT: - There is no evidence of deep vein thrombosis in the lower extremity.  Medical Decision Making   CHAUNTAE HULTS is a 79 y.o. female who presents with: LLE edema, worse with dependency. Sonographic evidence of deep venous and anterior accessory saphenous vein reflux.  The AASV is not dilated. Bilateral varicose veins of lower extremities.  No evidence of DVT or arterial insufficiency.    Based on the patient's history and examination, I recommend: continue  elevating above the heart for several times a day of approximately 15 minutes.  Written information provided.  I discussed with the patient the use of her knee high compression stockings and need to don these before  getting out of bed each morning.  Advised to monitor her edema and skin of the LLE and to call us should her symptoms worsen.  Thank you for allowing [place] to participate in this patient's care.   Barbie Banner, PA-C [facility] Office: [phone]  01/30/2020, 12:21 PM  Clinic MD: Oneida Alar

## 2020-02-04 DIAGNOSIS — M170 Bilateral primary osteoarthritis of knee: Secondary | ICD-10-CM | POA: Diagnosis not present

## 2020-03-10 DIAGNOSIS — M545 Low back pain, unspecified: Secondary | ICD-10-CM | POA: Diagnosis not present

## 2020-03-10 DIAGNOSIS — M25562 Pain in left knee: Secondary | ICD-10-CM | POA: Diagnosis not present

## 2020-03-18 ENCOUNTER — Ambulatory Visit (HOSPITAL_COMMUNITY)
Admission: RE | Admit: 2020-03-18 | Discharge: 2020-03-18 | Disposition: A | Discharge status: Home / Self Care | Payer: Medicare Other | Source: Ambulatory Visit | Attending: Nurse Practitioner | Admitting: Nurse Practitioner

## 2020-03-18 ENCOUNTER — Other Ambulatory Visit: Payer: Self-pay

## 2020-03-18 ENCOUNTER — Encounter (HOSPITAL_COMMUNITY): Payer: Self-pay | Admitting: Nurse Practitioner

## 2020-03-18 VITALS — BP 140/72 | HR 96 | Ht 66.0 in | Wt 162.2 lb

## 2020-03-18 DIAGNOSIS — G4733 Obstructive sleep apnea (adult) (pediatric): Secondary | ICD-10-CM | POA: Diagnosis not present

## 2020-03-18 DIAGNOSIS — Z79899 Other long term (current) drug therapy: Secondary | ICD-10-CM | POA: Insufficient documentation

## 2020-03-18 DIAGNOSIS — E039 Hypothyroidism, unspecified: Secondary | ICD-10-CM | POA: Diagnosis not present

## 2020-03-18 DIAGNOSIS — Z8249 Family history of ischemic heart disease and other diseases of the circulatory system: Secondary | ICD-10-CM | POA: Diagnosis not present

## 2020-03-18 DIAGNOSIS — Z7901 Long term (current) use of anticoagulants: Secondary | ICD-10-CM | POA: Insufficient documentation

## 2020-03-18 DIAGNOSIS — I480 Paroxysmal atrial fibrillation: Secondary | ICD-10-CM | POA: Diagnosis not present

## 2020-03-18 DIAGNOSIS — Z79890 Hormone replacement therapy (postmenopausal): Secondary | ICD-10-CM | POA: Diagnosis not present

## 2020-03-18 DIAGNOSIS — E785 Hyperlipidemia, unspecified: Secondary | ICD-10-CM | POA: Insufficient documentation

## 2020-03-18 DIAGNOSIS — Z888 Allergy status to other drugs, medicaments and biological substances status: Secondary | ICD-10-CM | POA: Diagnosis not present

## 2020-03-18 DIAGNOSIS — D6869 Other thrombophilia: Secondary | ICD-10-CM | POA: Diagnosis not present

## 2020-03-18 DIAGNOSIS — I10 Essential (primary) hypertension: Secondary | ICD-10-CM | POA: Diagnosis not present

## 2020-03-18 LAB — CBC
HCT: 37.2 % (ref 36.0–46.0)
Hemoglobin: 11.8 g/dL — ABNORMAL LOW (ref 12.0–15.0)
MCH: 30.4 pg (ref 26.0–34.0)
MCHC: 31.7 g/dL (ref 30.0–36.0)
MCV: 95.9 fL (ref 80.0–100.0)
Platelets: 416 10*3/uL — ABNORMAL HIGH (ref 150–400)
RBC: 3.88 MIL/uL (ref 3.87–5.11)
RDW: 14.2 % (ref 11.5–15.5)
WBC: 10.7 10*3/uL — ABNORMAL HIGH (ref 4.0–10.5)
nRBC: 0 % (ref 0.0–0.2)

## 2020-03-18 LAB — BASIC METABOLIC PANEL
Anion gap: 8 (ref 5–15)
BUN: 17 mg/dL (ref 8–23)
CO2: 27 mmol/L (ref 22–32)
Calcium: 9.3 mg/dL (ref 8.9–10.3)
Chloride: 98 mmol/L (ref 98–111)
Creatinine, Ser: 0.82 mg/dL (ref 0.44–1.00)
GFR, Estimated: >60 mL/min (ref >60)
Glucose, Bld: 106 mg/dL — ABNORMAL HIGH (ref 70–99)
Potassium: 4.8 mmol/L (ref 3.5–5.1)
Sodium: 133 mmol/L — ABNORMAL LOW (ref 135–145)

## 2020-03-18 LAB — TSH: TSH: 3.16 u[IU]/mL (ref 0.350–4.500)

## 2020-03-18 NOTE — H&P (View-Only) (Signed)
Primary Care Physician: Shon Baton, MD Referring Physician: [facility] ER f/u Cardiologist-  [name]  EP- [name] is a 79 y.o. female with a h/o HLD, hypothyroidism  that was successfully cardioverted in the [facility] ER 10/26/17 for new onset Afib. She returned to  the afib office 8/2 but had gone back into  afib and was unaware. She was started on BB for better rate control.   She denied tobacco, alcohol, excessive caffeine. Had been told she snored and appeared to stop breathing at night. Sleep study done 8/30 showed OSA. CPAP started. She had an echo that showed EF at 35% with diffuse hypokinesis. Lexi myoview did not show any ischemia but was an intermediate test.  She was already on an ARB/BB.   She remained in afib, rate controlled. She denied any exertional chest pain, is fatigued and has noted shortness of breath more so with steps. She was admitted for Tikosyn.  However after discharge qt prolonged and Tikosyn was stopped.   Since then she has had a long period of time without any afib until this past Monday. No  specific trigger but afib has been persistent since then. She feels some nausea and fatigue while in afib. Afib in the 9 's today on EKG. Echo in 2020 showed normalization of EF.   F/u afib clinic 11/14/19, following successful cardioversion. ekg today shows  Sinus brady . She feels well, improved in SR. She has had her covid shots a while back.   F/u in afib clinic, 01/02/20. She called to the office last week c/o left leg swelling since August. Rt leg no swelling. Reduced her amlodipine for a few days with mild improvement but still swelling. I scheduled her for an Venous U/S this am which was negative for DVT. Minimal swelling this am. Usually  gets more swelling  during the day and down by the next am. She does wear intermittent support socks. Tries to minimize salt. No pain  associated with the swelling. 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 The U/S   technician mentioned to her that she could see evidence of  incompetent veins.   BP elevated on presentation this am, usually well controlled. On recheck improved to 150/80. She will check again when she gets home.  Today, she denies symptoms of palpitations, chest pain, shortness of breath, orthopnea, PND, lower extremity edema, dizziness, presyncope, syncope, or neurologic sequela. The patient is tolerating medications without difficulties and is otherwise without complaint today.   Past Medical History:  Diagnosis Date  . Allergy    seasonal  . Anemia   . Arthritis   . Cataract    bilateral removed  . Celiac disease   . Eczema   . H/O transfusion of platelets   . Hyperlipidemia   . Hypothyroidism   . OSA (obstructive sleep apnea)   . Osteopenia   . Paroxysmal atrial fibrillation (HCC)   . Shingles   . Vitamin D deficiency    Past Surgical History:  Procedure Laterality Date  . CARDIOVERSION N/A 12/14/2017   Procedure: CARDIOVERSION;  Surgeon: Sanda Klein, MD;  Location: [facility] ENDOSCOPY;  Service: Cardiovascular;  Laterality: N/A;  . CARDIOVERSION N/A 11/07/2019   Procedure: CARDIOVERSION;  Surgeon: Jerline Pain, MD;  Location: [facility] ENDOSCOPY;  Service: Cardiovascular;  Laterality: N/A;  . CATARACT EXTRACTION     bilateral  . COLONOSCOPY    . DILATION AND CURETTAGE OF UTERUS    .  UPPER GI ENDOSCOPY      Current Outpatient Medications  Medication Sig Dispense Refill  . alendronate (FOSAMAX) 70 MG tablet Take 70 mg by mouth every Tuesday.     Marland Kitchen amLODipine (NORVASC) 5 MG tablet Take 0.5 tablets (2.5 mg total) by mouth daily. 90 tablet 2  . Calcium Carbonate-Vitamin D (CALCIUM 600+D PO) Take 1 tablet by mouth daily.     . cholecalciferol (VITAMIN D) 400 units TABS tablet Take 400 Units by mouth daily at 12 noon.    . dorzolamide-timolol (COSOPT) 22.3-6.8 MG/ML ophthalmic solution Place 1 drop into the left eye 2 (two) times daily.    Marland Kitchen ELIQUIS 5 MG TABS tablet TAKE 1 TABLET  TWICE A DAY (Patient taking differently: Take 5 mg by mouth 2 (two) times daily.) 180 tablet 1  . gabapentin (NEURONTIN) 100 MG capsule Take 100 mg by mouth at bedtime.    Marland Kitchen levothyroxine (SYNTHROID) 88 MCG tablet Take 88 mcg by mouth daily.    Marland Kitchen loratadine (CLARITIN) 10 MG tablet Take 10 mg by mouth every evening.     Marland Kitchen losartan (COZAAR) 50 MG tablet TAKE 1 TABLET BY MOUTH EVERY DAY (Patient taking differently: Take 50 mg by mouth daily.) 90 tablet 3  . methocarbamol (ROBAXIN) 500 MG tablet Take 1,000 mg by mouth daily in the afternoon.    . metoprolol succinate (TOPROL-XL) 50 MG 24 hr tablet Take 50 mg by mouth daily. Take with or immediately following a meal.    . Multiple Vitamin (MULTIVITAMIN WITH MINERALS) TABS tablet Take 1 tablet by mouth daily.    Marland Kitchen omeprazole (PRILOSEC) 20 MG capsule Take 20 mg by mouth at bedtime.    Marland Kitchen oxybutynin (DITROPAN) 5 MG tablet Take 2.5 mg every evening by mouth.    Marland Kitchen PATADAY 0.2 % SOLN Place 1 drop into both eyes daily as needed (allergies).     . prednisoLONE acetate (PRED FORTE) 1 % ophthalmic suspension Place 1 drop into the left eye daily.     . rosuvastatin (CRESTOR) 5 MG tablet Take 5 mg by mouth every evening.   3  . SYSTANE ULTRA 0.4-0.3 % SOLN Place 1 drop into both eyes daily as needed (dry eyes).     . traMADol (ULTRAM) 50 MG tablet Take 50 mg by mouth every 6 (six) hours as needed.    . vitamin C (ASCORBIC ACID) 250 MG tablet Take 500 mg by mouth daily.    Marland Kitchen acetaminophen (TYLENOL) 500 MG tablet Take 1,000-1,500 mg by mouth 2 (two) times daily as needed for moderate pain or headache. (Patient not taking: Reported on 03/18/2020)     No current facility-administered medications for this encounter.    Allergies  Allergen Reactions  . Betadine [Povidone Iodine] Itching    With prolonged usage   . Tommas Olp Antonieta Pert Polysaccharide]     Severe constipation     Social History   Socioeconomic History  . Marital status: Widowed    Spouse name: Not on  file  . Number of children: 4  . Years of education: Not on file  . Highest education level: Not on file  Occupational History  . Occupation: retired    Fish farm manager: RETIRED  Tobacco Use  . Smoking status: Never Smoker  . Smokeless tobacco: Never Used  Vaping Use  . Vaping Use: Never used  Substance and Sexual Activity  . Alcohol use: No    Alcohol/week: 0.0 standard drinks  . Drug use: No  . Sexual activity: Not  on file  Other Topics Concern  . Not on file  Social History Narrative   Daily caffeine   Social Determinants of Health   Financial Resource Strain: Not on file  Food Insecurity: Not on file  Transportation Needs: Not on file  Physical Activity: Not on file  Stress: Not on file  Social Connections: Not on file  Intimate Partner Violence: Not on file    Family History  Problem Relation Age of Onset  . Liver cancer Mother   . Diabetes Father   . Heart disease Father   . Colon cancer Neg Hx     ROS- All systems are reviewed and negative except as per the HPI above  Physical Exam: Vitals:   03/18/20 1517  BP: 140/72  Pulse: 96  Weight: 73.6 kg  Height: 5' 6"  (1.676 m)   Wt Readings from Last 3 Encounters:  03/18/20 73.6 kg  01/30/20 75 kg  01/02/20 76.1 kg    Labs: Lab Results  Component Value Date   NA 133 (L) 11/04/2019   K 4.7 11/04/2019   CL 101 11/04/2019   CO2 25 11/04/2019   GLUCOSE 96 11/04/2019   BUN 15 11/04/2019   CREATININE 0.86 11/04/2019   CALCIUM 9.0 11/04/2019   MG 2.0 12/25/2017   No results found for: INR No results found for: CHOL, HDL, LDLCALC, TRIG   GEN- The patient is well appearing, alert and oriented x 3 today.   Head- normocephalic, atraumatic Eyes-  Sclera clear, conjunctiva pink Ears- hearing intact Oropharynx- clear Neck- supple, no JVP Lymph- no cervical lymphadenopathy Lungs- Clear to ausculation bilaterally, normal work of breathing Heart- irregular rate and rhythm, no murmurs, rubs or gallops, PMI not  laterally displaced GI- soft, NT, ND, + BS Extremities- no clubbing, cyanosis, or edema, + for varicose veins  MS- no significant deformity or atrophy Skin- no rash or lesion Psych- euthymic mood, full affect Neuro- strength and sensation are intact  EKG-afib at 96 bpm, NSIVB Epic records reviewed Echo- 1. The left ventricle has normal systolic function with an ejection  fraction of 60-65%. The cavity size was normal. There is mild concentric  left ventricular hypertrophy. Left ventricular diastolic Doppler  parameters are consistent with  pseudonormalization. Elevated mean left atrial pressure.  2. The right ventricle has normal systolic function. The cavity was  normal. There is no increase in right ventricular wall thickness. Right  ventricular systolic pressure is normal with an estimated pressure of 36.9  mmHg.  3. Left atrial size was mildly dilated.  4. There is mild to moderate mitral annular calcification present.  5. The aorta is normal in size and structure.     Assessment and Plan: 1. Paroxysmal afib Maintaining SR since August  Will plan on cardioversion as we just on top of the holidays If ERAF,  will need to discuss AAD  Continue  metoprolol succinate 50 mg daily, I will not increase this as she shows me soft BP's at home  Continue CPAP She has had  covid shots  Cbc/bmet/covid  2. Chadsvasc score of  3 Continue eliquis 5 mg bid   3. HTN Stable here Some soft BP's since being in SR    F/u with afib clinic after cardioversion    Butch Penny C. Rivky Clendenning, [facility] [address] [place], [place] [address] [phone]

## 2020-03-18 NOTE — Progress Notes (Addendum)
Primary Care Physician: Shon Baton, MD Referring Physician: [facility] ER f/u Cardiologist-  [name]  EP- [name] is a 79 y.o. female with a h/o HLD, hypothyroidism  that was successfully cardioverted in the [facility] ER 10/26/17 for new onset Afib. She returned to  the afib office 8/2 but had gone back into  afib and was unaware. She was started on BB for better rate control.   She denied tobacco, alcohol, excessive caffeine. Had been told she snored and appeared to stop breathing at night. Sleep study done 8/30 showed OSA. CPAP started. She had an echo that showed EF at 35% with diffuse hypokinesis. Lexi myoview did not show any ischemia but was an intermediate test.  She was already on an ARB/BB.   She remained in afib, rate controlled. She denied any exertional chest pain, is fatigued and has noted shortness of breath more so with steps. She was admitted for Tikosyn.  However after discharge qt prolonged and Tikosyn was stopped.   Since then she has had a long period of time without any afib until this past Monday. No  specific trigger but afib has been persistent since then. She feels some nausea and fatigue while in afib. Afib in the 4 's today on EKG. Echo in 2020 showed normalization of EF.   F/u afib clinic 11/14/19, following successful cardioversion. ekg today shows  Sinus brady . She feels well, improved in SR. She has had her covid shots a while back.   F/u in afib clinic, 01/02/20. She called to the office last week c/o left leg swelling since August. Rt leg no swelling. Reduced her amlodipine for a few days with mild improvement but still swelling. I scheduled her for an Venous U/S this am which was negative for DVT. Minimal swelling this am. Usually  gets more swelling  during the day and down by the next am. She does wear intermittent support socks. Tries to minimize salt. No pain  associated with the swelling. 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 The U/S   technician mentioned to her that she could see evidence of  incompetent veins.   BP elevated on presentation this am, usually well controlled. On recheck improved to 150/80. She will check again when she gets home.  F/u in afib clinic, 12/15,  for return of afib. Would like  to be cardioverted. No missed anticoagulation. She is in afib in the 90's.  Today, she denies symptoms of palpitations, chest pain, shortness of breath, orthopnea, PND, lower extremity edema, dizziness, presyncope, syncope, or neurologic sequela. The patient is tolerating medications without difficulties and is otherwise without complaint today.   Past Medical History:  Diagnosis Date  . Allergy    seasonal  . Anemia   . Arthritis   . Cataract    bilateral removed  . Celiac disease   . Eczema   . H/O transfusion of platelets   . Hyperlipidemia   . Hypothyroidism   . OSA (obstructive sleep apnea)   . Osteopenia   . Paroxysmal atrial fibrillation (HCC)   . Shingles   . Vitamin D deficiency    Past Surgical History:  Procedure Laterality Date  . CARDIOVERSION N/A 12/14/2017   Procedure: CARDIOVERSION;  Surgeon: Sanda Klein, MD;  Location: [facility] END[facility];  Service: Cardiovascular;  Laterality: N/A;  . CARDIOVERSION N/A 11/07/2019   Procedure: CARDIOVERSION;  Surgeon: Jerline Pain, MD;  Location: [facility];  Service: Cardiovascular;  Laterality: N/A;  . CATARACT EXTRACTION     bilateral  . COLONOSCOPY    . DILATION AND CURETTAGE OF UTERUS    . UPPER GI ENDOSCOPY      Current Outpatient Medications  Medication Sig Dispense Refill  . alendronate (FOSAMAX) 70 MG tablet Take 70 mg by mouth every Tuesday.     Marland Kitchen amLODipine (NORVASC) 5 MG tablet Take 0.5 tablets (2.5 mg total) by mouth daily. 90 tablet 2  . Calcium Carbonate-Vitamin D (CALCIUM 600+D PO) Take 1 tablet by mouth daily.     . cholecalciferol (VITAMIN D) 400 units TABS tablet Take 400 Units by mouth daily at 12 noon.    . dorzolamide-timolol  (COSOPT) 22.3-6.8 MG/ML ophthalmic solution Place 1 drop into the left eye 2 (two) times daily.    Marland Kitchen ELIQUIS 5 MG TABS tablet TAKE 1 TABLET TWICE A DAY (Patient taking differently: Take 5 mg by mouth 2 (two) times daily.) 180 tablet 1  . gabapentin (NEURONTIN) 100 MG capsule Take 100 mg by mouth at bedtime.    Marland Kitchen levothyroxine (SYNTHROID) 88 MCG tablet Take 88 mcg by mouth daily.    Marland Kitchen loratadine (CLARITIN) 10 MG tablet Take 10 mg by mouth every evening.     Marland Kitchen losartan (COZAAR) 50 MG tablet TAKE 1 TABLET BY MOUTH EVERY DAY (Patient taking differently: Take 50 mg by mouth daily.) 90 tablet 3  . methocarbamol (ROBAXIN) 500 MG tablet Take 1,000 mg by mouth daily in the afternoon.    . metoprolol succinate (TOPROL-XL) 50 MG 24 hr tablet Take 50 mg by mouth daily. Take with or immediately following a meal.    . Multiple Vitamin (MULTIVITAMIN WITH MINERALS) TABS tablet Take 1 tablet by mouth daily.    Marland Kitchen omeprazole (PRILOSEC) 20 MG capsule Take 20 mg by mouth at bedtime.    Marland Kitchen oxybutynin (DITROPAN) 5 MG tablet Take 2.5 mg every evening by mouth.    Marland Kitchen PATADAY 0.2 % SOLN Place 1 drop into both eyes daily as needed (allergies).     . prednisoLONE acetate (PRED FORTE) 1 % ophthalmic suspension Place 1 drop into the left eye daily.     . rosuvastatin (CRESTOR) 5 MG tablet Take 5 mg by mouth every evening.   3  . SYSTANE ULTRA 0.4-0.3 % SOLN Place 1 drop into both eyes daily as needed (dry eyes).     . traMADol (ULTRAM) 50 MG tablet Take 50 mg by mouth every 6 (six) hours as needed.    . vitamin C (ASCORBIC ACID) 250 MG tablet Take 500 mg by mouth daily.    Marland Kitchen acetaminophen (TYLENOL) 500 MG tablet Take 1,000-1,500 mg by mouth 2 (two) times daily as needed for moderate pain or headache. (Patient not taking: Reported on 03/18/2020)     No current facility-administered medications for this encounter.    Allergies  Allergen Reactions  . Betadine [Povidone Iodine] Itching    With prolonged usage   . Tommas Olp Antonieta Pert  Polysaccharide]     Severe constipation     Social History   Socioeconomic History  . Marital status: Widowed    Spouse name: Not on file  . Number of children: 4  . Years of education: Not on file  . Highest education level: Not on file  Occupational History  . Occupation: retired    Fish farm manager: RETIRED  Tobacco Use  . Smoking status: Never Smoker  . Smokeless tobacco: Never Used  Vaping Use  . Vaping Use: Never used  Substance and Sexual Activity  . Alcohol use: No    Alcohol/week: 0.0 standard drinks  . Drug use: No  . Sexual activity: Not on file  Other Topics Concern  . Not on file  Social History Narrative   Daily caffeine   Social Determinants of Health   Financial Resource Strain: Not on file  Food Insecurity: Not on file  Transportation Needs: Not on file  Physical Activity: Not on file  Stress: Not on file  Social Connections: Not on file  Intimate Partner Violence: Not on file    Family History  Problem Relation Age of Onset  . Liver cancer Mother   . Diabetes Father   . Heart disease Father   . Colon cancer Neg Hx     ROS- All systems are reviewed and negative except as per the HPI above  Physical Exam: Vitals:   03/18/20 1517  BP: 140/72  Pulse: 96  Weight: 73.6 kg  Height: 5' 6"  (1.676 m)   Wt Readings from Last 3 Encounters:  03/18/20 73.6 kg  01/30/20 75 kg  01/02/20 76.1 kg    Labs: Lab Results  Component Value Date   NA 133 (L) 11/04/2019   K 4.7 11/04/2019   CL 101 11/04/2019   CO2 25 11/04/2019   GLUCOSE 96 11/04/2019   BUN 15 11/04/2019   CREATININE 0.86 11/04/2019   CALCIUM 9.0 11/04/2019   MG 2.0 12/25/2017   No results found for: INR No results found for: CHOL, HDL, LDLCALC, TRIG   GEN- The patient is well appearing, alert and oriented x 3 today.   Head- normocephalic, atraumatic Eyes-  Sclera clear, conjunctiva pink Ears- hearing intact Oropharynx- clear Neck- supple, no JVP Lymph- no cervical  lymphadenopathy Lungs- Clear to ausculation bilaterally, normal work of breathing Heart- irregular rate and rhythm, no murmurs, rubs or gallops, PMI not laterally displaced GI- soft, NT, ND, + BS Extremities- no clubbing, cyanosis, or edema, + for varicose veins  MS- no significant deformity or atrophy Skin- no rash or lesion Psych- euthymic mood, full affect Neuro- strength and sensation are intact  EKG-afib at 96 bpm, NSIVB Epic records reviewed Echo- 1. The left ventricle has normal systolic function with an ejection  fraction of 60-65%. The cavity size was normal. There is mild concentric  left ventricular hypertrophy. Left ventricular diastolic Doppler  parameters are consistent with  pseudonormalization. Elevated mean left atrial pressure.  2. The right ventricle has normal systolic function. The cavity was  normal. There is no increase in right ventricular wall thickness. Right  ventricular systolic pressure is normal with an estimated pressure of 36.9  mmHg.  3. Left atrial size was mildly dilated.  4. There is mild to moderate mitral annular calcification present.  5. The aorta is normal in size and structure.     Assessment and Plan: 1. Paroxysmal afib Maintaining SR since August  Will plan on cardioversion  If ERAF,  will need to discuss AAD  Continue  metoprolol succinate 50 mg daily, I will not increase this as she shows me soft BP's at home  Continue CPAP She has had  covid shots  Cbc/bmet/covid  2. Chadsvasc score of  3 Continue eliquis 5 mg bid   3. HTN Stable here Some soft BP's since being in SR    F/u with afib clinic after cardioversion    Butch Penny C. Serinity Ware, [facility] [address] [place], [place] [address] [phone]

## 2020-03-18 NOTE — Patient Instructions (Signed)
Cardioversion scheduled for Tuesday, December 21st  - Arrive at the [facility] and go to admitting at 1230PM  - Do not eat or drink anything after midnight the night prior to your procedure.  - Take all your morning medication (except diabetic medications) with a sip of water prior to arrival.  - You will not be able to drive home after your procedure.  - Do NOT miss any doses of your blood thinner - if you should miss a dose please notify our office immediately.  - If you feel as if you go back into normal rhythm prior to scheduled cardioversion, please notify our office immediately. If your procedure is canceled in the cardioversion suite you will be charged a cancellation fee.

## 2020-03-21 ENCOUNTER — Other Ambulatory Visit (HOSPITAL_COMMUNITY)
Admission: RE | Admit: 2020-03-21 | Discharge: 2020-03-21 | Disposition: A | Discharge status: Home / Self Care | Payer: Medicare Other | Source: Ambulatory Visit | Attending: Cardiology | Admitting: Cardiology

## 2020-03-21 DIAGNOSIS — Z01812 Encounter for preprocedural laboratory examination: Secondary | ICD-10-CM | POA: Insufficient documentation

## 2020-03-21 DIAGNOSIS — Z20822 Contact with and (suspected) exposure to covid-19: Secondary | ICD-10-CM | POA: Insufficient documentation

## 2020-03-21 LAB — SARS CORONAVIRUS 2 (TAT 6-24 HRS): SARS Coronavirus 2: NEGATIVE

## 2020-03-24 ENCOUNTER — Ambulatory Visit (HOSPITAL_COMMUNITY): Payer: Medicare Other | Admitting: Anesthesiology

## 2020-03-24 ENCOUNTER — Encounter (HOSPITAL_COMMUNITY): Payer: Self-pay | Admitting: Cardiology

## 2020-03-24 ENCOUNTER — Encounter (HOSPITAL_COMMUNITY)
Admission: RE | Disposition: A | Discharge status: Home / Self Care | Payer: Self-pay | Source: Home / Self Care | Attending: Cardiology

## 2020-03-24 ENCOUNTER — Ambulatory Visit (HOSPITAL_COMMUNITY)
Admission: RE | Admit: 2020-03-24 | Discharge: 2020-03-24 | Disposition: A | Discharge status: Home / Self Care | Payer: Medicare Other | Attending: Cardiology | Admitting: Cardiology

## 2020-03-24 DIAGNOSIS — I10 Essential (primary) hypertension: Secondary | ICD-10-CM | POA: Diagnosis not present

## 2020-03-24 DIAGNOSIS — Z79899 Other long term (current) drug therapy: Secondary | ICD-10-CM | POA: Diagnosis not present

## 2020-03-24 DIAGNOSIS — E7800 Pure hypercholesterolemia, unspecified: Secondary | ICD-10-CM | POA: Diagnosis not present

## 2020-03-24 DIAGNOSIS — I480 Paroxysmal atrial fibrillation: Secondary | ICD-10-CM | POA: Diagnosis not present

## 2020-03-24 DIAGNOSIS — Z79890 Hormone replacement therapy (postmenopausal): Secondary | ICD-10-CM | POA: Diagnosis not present

## 2020-03-24 DIAGNOSIS — Z7901 Long term (current) use of anticoagulants: Secondary | ICD-10-CM | POA: Insufficient documentation

## 2020-03-24 DIAGNOSIS — I110 Hypertensive heart disease with heart failure: Secondary | ICD-10-CM | POA: Diagnosis not present

## 2020-03-24 DIAGNOSIS — I4819 Other persistent atrial fibrillation: Secondary | ICD-10-CM

## 2020-03-24 DIAGNOSIS — I5022 Chronic systolic (congestive) heart failure: Secondary | ICD-10-CM | POA: Diagnosis not present

## 2020-03-24 HISTORY — PX: CARDIOVERSION: SHX1299

## 2020-03-24 SURGERY — CARDIOVERSION
Anesthesia: General

## 2020-03-24 MED ORDER — SODIUM CHLORIDE 0.9 % IV SOLN: INTRAVENOUS | Status: DC | PRN

## 2020-03-24 MED ORDER — PROPOFOL 10 MG/ML IV BOLUS
INTRAVENOUS | Status: DC | PRN
  Administered 2020-03-24 (×2): 50 mg via INTRAVENOUS

## 2020-03-24 MED ORDER — LIDOCAINE 2% (20 MG/ML) 5 ML SYRINGE
INTRAMUSCULAR | Status: DC | PRN
  Administered 2020-03-24: 60 mg via INTRAVENOUS

## 2020-03-24 NOTE — Anesthesia Preprocedure Evaluation (Addendum)
Anesthesia Evaluation  Patient identified by MRN, date of birth, ID band Patient awake    Reviewed: Allergy & Precautions, NPO status , Patient's Chart, lab work & pertinent test results  Airway Mallampati: II  TM Distance: >3 FB Neck ROM: Full    Dental no notable dental hx.    Pulmonary sleep apnea ,    Pulmonary exam normal breath sounds clear to auscultation       Cardiovascular hypertension, Pt. on medications and Pt. on home beta blockers + dysrhythmias Atrial Fibrillation  Rhythm:Irregular Rate:Tachycardia  ECG: a-fib, rate 96   Neuro/Psych negative neurological ROS  negative psych ROS   GI/Hepatic Neg liver ROS, GERD  Medicated,  Endo/Other  Hypothyroidism   Renal/GU negative Renal ROS     Musculoskeletal  (+) Arthritis ,   Abdominal   Peds  Hematology HLD   Anesthesia Other Findings A-FIB  Reproductive/Obstetrics                            Anesthesia Physical Anesthesia Plan  ASA: IV  Anesthesia Plan: General   Post-op Pain Management:    Induction: Intravenous  PONV Risk Score and Plan: 3 and Propofol infusion and Treatment may vary due to age or medical condition  Airway Management Planned: Mask  Additional Equipment:   Intra-op Plan:   Post-operative Plan:   Informed Consent: I have reviewed the patients History and Physical, chart, labs and discussed the procedure including the risks, benefits and alternatives for the proposed anesthesia with the patient or authorized representative who has indicated his/her understanding and acceptance.     Dental advisory given  Plan Discussed with: CRNA  Anesthesia Plan Comments:        Anesthesia Quick Evaluation

## 2020-03-24 NOTE — Interval H&P Note (Signed)
History and Physical Interval Note:  03/24/2020 11:24 AM  Susan Bell  has presented today for surgery, with the diagnosis of AFIB.  The various methods of treatment have been discussed with the patient and family. After consideration of risks, benefits and other options for treatment, the patient has consented to  Procedure(s): CARDIOVERSION (N/A) as a surgical intervention.  The patient's history has been reviewed, patient examined, no change in status, stable for surgery.  I have reviewed the patient's chart and labs.  Questions were answered to the patient's satisfaction.     [name]

## 2020-03-24 NOTE — Transfer of Care (Signed)
Immediate Anesthesia Transfer of Care Note  Patient: Susan Bell  Procedure(s) Performed: CARDIOVERSION (N/A )  Patient Location: Endoscopy Unit  Anesthesia Type:General  Level of Consciousness: drowsy, patient cooperative and responds to stimulation  Airway & Oxygen Therapy: Patient Spontanous Breathing  Post-op Assessment: Report given to RN, Post -op Vital signs reviewed and stable and Patient moving all extremities X 4  Post vital signs: Reviewed and stable  Last Vitals:  Vitals Value Taken Time  BP 126/56   Temp    Pulse 105   Resp 10   SpO2 95     Last Pain:  Vitals:   03/24/20 1118  TempSrc: Tympanic  PainSc: 0-No pain         Complications: No complications documented.

## 2020-03-24 NOTE — Anesthesia Postprocedure Evaluation (Signed)
Anesthesia Post Note  Patient: Susan Bell  Procedure(s) Performed: CARDIOVERSION (N/A )     Patient location during evaluation: Endoscopy Anesthesia Type: General Level of consciousness: awake Pain management: pain level controlled Vital Signs Assessment: post-procedure vital signs reviewed and stable Respiratory status: spontaneous breathing, nonlabored ventilation, respiratory function stable and patient connected to nasal cannula oxygen Cardiovascular status: blood pressure returned to baseline and stable Postop Assessment: no apparent nausea or vomiting Anesthetic complications: no   No complications documented.  Last Vitals:  Vitals:   03/24/20 1149 03/24/20 1200  BP: (!) 143/76 137/81  Pulse: 82 88  Resp: 16 14  Temp:    SpO2: 98% 98%    Last Pain:  Vitals:   03/24/20 1200  TempSrc:   PainSc: 0-No pain                 Izabela Ow P Nevah Dalal

## 2020-03-24 NOTE — Discharge Instructions (Signed)
Electrical Cardioversion Electrical cardioversion is the delivery of a jolt of electricity to restore a normal rhythm to the heart. A rhythm that is too fast or is not regular keeps the heart from pumping well. In this procedure, sticky patches or metal paddles are placed on the chest to deliver electricity to the heart from a device. This procedure may be done in an emergency if:  There is low or no blood pressure as a result of the heart rhythm.  Normal rhythm must be restored as fast as possible to protect the brain and heart from further damage.  It may save a life. This may also be a scheduled procedure for irregular or fast heart rhythms that are not immediately life-threatening. Tell a health care provider about:  Any allergies you have.  All medicines you are taking, including vitamins, herbs, eye drops, creams, and over-the-counter medicines.  Any problems you or family members have had with anesthetic medicines.  Any blood disorders you have.  Any surgeries you have had.  Any medical conditions you have.  Whether you are pregnant or may be pregnant. What are the risks? Generally, this is a safe procedure. However, problems may occur, including:  Allergic reactions to medicines.  A blood clot that breaks free and travels to other parts of your body.  The possible return of an abnormal heart rhythm within hours or days after the procedure.  Your heart stopping (cardiac arrest). This is rare. What happens before the procedure? Medicines  Your health care provider may have you start taking: ? Blood-thinning medicines (anticoagulants) so your blood does not clot as easily. ? Medicines to help stabilize your heart rate and rhythm.  Ask your health care provider about: ? Changing or stopping your regular medicines. This is especially important if you are taking diabetes medicines or blood thinners. ? Taking medicines such as aspirin and ibuprofen. These medicines can  thin your blood. Do not take these medicines unless your health care provider tells you to take them. ? Taking over-the-counter medicines, vitamins, herbs, and supplements. General instructions  Follow instructions from your health care provider about eating or drinking restrictions.  Plan to have someone take you home from the hospital or clinic.  If you will be going home right after the procedure, plan to have someone with you for 24 hours.  Ask your health care provider what steps will be taken to help prevent infection. These may include washing your skin with a germ-killing soap. What happens during the procedure?   An IV will be inserted into one of your veins.  Sticky patches (electrodes) or metal paddles may be placed on your chest.  You will be given a medicine to help you relax (sedative).  An electrical shock will be delivered. The procedure may vary among health care providers and hospitals. What can I expect after the procedure?  Your blood pressure, heart rate, breathing rate, and blood oxygen level will be monitored until you leave the hospital or clinic.  Your heart rhythm will be watched to make sure it does not change.  You may have some redness on the skin where the shocks were given. Follow these instructions at home:  Do not drive for 24 hours if you were given a sedative during your procedure.  Take over-the-counter and prescription medicines only as told by your health care provider.  Ask your health care provider how to check your pulse. Check it often.  Rest for 48 hours after the procedure or   as told by your health care provider.  Avoid or limit your caffeine use as told by your health care provider.  Keep all follow-up visits as told by your health care provider. This is important. Contact a health care provider if:  You feel like your heart is beating too quickly or your pulse is not regular.  You have a serious muscle cramp that does not go  away. Get help right away if:  You have discomfort in your chest.  You are dizzy or you feel faint.  You have trouble breathing or you are short of breath.  Your speech is slurred.  You have trouble moving an arm or leg on one side of your body.  Your fingers or toes turn cold or blue. Summary  Electrical cardioversion is the delivery of a jolt of electricity to restore a normal rhythm to the heart.  This procedure may be done right away in an emergency or may be a scheduled procedure if the condition is not an emergency.  Generally, this is a safe procedure.  After the procedure, check your pulse often as told by your health care provider. This information is not intended to replace advice given to you by your health care provider. Make sure you discuss any questions you have with your health care provider. Document Revised: 10/22/2018 Document Reviewed: 10/22/2018 Elsevier Patient Education  2020 [employer]  

## 2020-03-24 NOTE — Procedures (Signed)
Procedure: Electrical Cardioversion Indications:  Atrial Fibrillation  Procedure Details:  Consent: Risks of procedure as well as the alternatives and risks of each were explained to the (patient/caregiver).  Consent for procedure obtained.  Time Out: Verified patient identification, verified procedure, site/side was marked, verified correct patient position, special equipment/implants available, medications/allergies/relevent history reviewed, required imaging and test results available. PERFORMED.  Patient placed on cardiac monitor, pulse oximetry, supplemental oxygen as necessary.  Sedation given: Propofol 147m; lidocaine 645madministered by CV anesthesia Pacer pads placed anterior and posterior chest.  Cardioverted 3 time(s).  Cardioversion with synchronized biphasic 200J shock.  Evaluation: Findings: Post procedure EKG shows: Atrial Fibrillation Complications: None Patient did tolerate procedure well.  Time Spent Directly with the Patient:  3073mtes   HeaFreada Bergeron/21/2021, 11:35 AM

## 2020-03-31 ENCOUNTER — Encounter (HOSPITAL_COMMUNITY): Payer: Self-pay | Admitting: Nurse Practitioner

## 2020-03-31 ENCOUNTER — Other Ambulatory Visit: Payer: Self-pay

## 2020-03-31 ENCOUNTER — Ambulatory Visit (HOSPITAL_COMMUNITY)
Admission: RE | Admit: 2020-03-31 | Discharge: 2020-03-31 | Disposition: A | Discharge status: Home / Self Care | Payer: Medicare Other | Source: Ambulatory Visit | Attending: Nurse Practitioner | Admitting: Nurse Practitioner

## 2020-03-31 VITALS — BP 138/74 | HR 121 | Ht 66.0 in | Wt 160.4 lb

## 2020-03-31 DIAGNOSIS — E785 Hyperlipidemia, unspecified: Secondary | ICD-10-CM | POA: Insufficient documentation

## 2020-03-31 DIAGNOSIS — Z8249 Family history of ischemic heart disease and other diseases of the circulatory system: Secondary | ICD-10-CM | POA: Diagnosis not present

## 2020-03-31 DIAGNOSIS — E039 Hypothyroidism, unspecified: Secondary | ICD-10-CM | POA: Insufficient documentation

## 2020-03-31 DIAGNOSIS — I4819 Other persistent atrial fibrillation: Secondary | ICD-10-CM | POA: Insufficient documentation

## 2020-03-31 DIAGNOSIS — Z79899 Other long term (current) drug therapy: Secondary | ICD-10-CM | POA: Diagnosis not present

## 2020-03-31 DIAGNOSIS — D6869 Other thrombophilia: Secondary | ICD-10-CM | POA: Diagnosis not present

## 2020-03-31 DIAGNOSIS — G4733 Obstructive sleep apnea (adult) (pediatric): Secondary | ICD-10-CM | POA: Insufficient documentation

## 2020-03-31 DIAGNOSIS — I10 Essential (primary) hypertension: Secondary | ICD-10-CM | POA: Insufficient documentation

## 2020-03-31 DIAGNOSIS — Z7901 Long term (current) use of anticoagulants: Secondary | ICD-10-CM | POA: Diagnosis not present

## 2020-03-31 LAB — HEPATIC FUNCTION PANEL
ALT: 17 U/L (ref 0–44)
AST: 20 U/L (ref 15–41)
Albumin: 3.7 g/dL (ref 3.5–5.0)
Alkaline Phosphatase: 52 U/L (ref 38–126)
Bilirubin, Direct: 0.1 mg/dL (ref 0.0–0.2)
Indirect Bilirubin: 0.6 mg/dL (ref 0.3–0.9)
Total Bilirubin: 0.7 mg/dL (ref 0.3–1.2)
Total Protein: 6.3 g/dL — ABNORMAL LOW (ref 6.5–8.1)

## 2020-03-31 MED ORDER — AMIODARONE HCL 200 MG PO TABS: ORAL_TABLET | ORAL | 0 refills | Status: DC

## 2020-03-31 NOTE — Patient Instructions (Signed)
Start Amiodarone 200mg  twice a day (with food) for one month then reduce to 200mg  once a day

## 2020-03-31 NOTE — Addendum Note (Signed)
Encounter addended by: Sherran Needs, NP on: 03/31/2020 4:25 PM  Actions taken: Clinical Note Signed

## 2020-03-31 NOTE — Progress Notes (Addendum)
Primary Care Physician: Shon Baton, MD Referring Physician: [facility] ER f/u Cardiologist-  [name]  EP- [name] is a 79 y.o. female with a h/o HLD, hypothyroidism  that was successfully cardioverted in the [facility] ER 10/26/17 for new onset Afib. She returned to  the afib office 8/2 but had gone back into  afib and was unaware. She was started on BB for better rate control.   She denied tobacco, alcohol, excessive caffeine. Had been told she snored and appeared to stop breathing at night. Sleep study done 8/30 showed OSA. CPAP started. She had an echo that showed EF at 35% with diffuse hypokinesis. Lexi myoview did not show any ischemia but was an intermediate test.  She was already on an ARB/BB.   She remained in afib, rate controlled. She denied any exertional chest pain, is fatigued and has noted shortness of breath more so with steps. She was admitted for Tikosyn.  However after discharge qt prolonged and Tikosyn was stopped.   Since then she has had a long period of time without any afib until this past Monday. No  specific trigger but afib has been persistent since then. She feels some nausea and fatigue while in afib. Afib in the 64 's today on EKG. Echo in 2020 showed normalization of EF.   F/u afib clinic 11/14/19, following successful cardioversion. ekg today shows  Sinus brady . She feels well, improved in SR. She has had her covid shots a while back.   F/u in afib clinic, 01/02/20. She called to the office last week c/o left leg swelling since August. Rt leg no swelling. Reduced her amlodipine for a few days with mild improvement but still swelling. I scheduled her for an Venous U/S this am which was negative for DVT. Minimal swelling this am. Usually  gets more swelling  during the day and down by the next am. She does wear intermittent support socks. Tries to minimize salt. No pain  associated with the swelling. 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 The U/S   technician mentioned to her that she could see evidence of  incompetent veins.   BP elevated on presentation this am, usually well controlled. On recheck improved to 150/80. She will check again when she gets home.  F/u in afib clinic, 12/15,  for return of afib. Would like  to be cardioverted. No missed anticoagulation. She is in afib in the 90's.  F/u afib clinic, 03/31/20, failed cardioversion, despite multiple shocks. We discussed antiarrythmic's. She has failed tikoyn in the past. She has h/o HF and IVCD so flecainide/Multaq is not an option. So dicussed amiodarone with pt as it appears to be her only option. I discussed this could be short term bridge  to ablation in the near future as she appears to be a good candidate. But she runs fast in afib so feel she will be better served to get her back in rhythm short term with amiodarone. She is in agreement.   Today, she denies symptoms of palpitations, chest pain, shortness of breath, orthopnea, PND, lower extremity edema, dizziness, presyncope, syncope, or neurologic sequela. The patient is tolerating medications without difficulties and is otherwise without complaint today.   Past Medical History:  Diagnosis Date  . Allergy    seasonal  . Anemia   . Arthritis   . Cataract    bilateral removed  . Celiac disease   . Eczema   .  H/O transfusion of platelets   . Hyperlipidemia   . Hypothyroidism   . OSA (obstructive sleep apnea)   . Osteopenia   . Paroxysmal atrial fibrillation (HCC)   . Shingles   . Vitamin D deficiency    Past Surgical History:  Procedure Laterality Date  . CARDIOVERSION N/A 12/14/2017   Procedure: CARDIOVERSION;  Surgeon: Sanda Klein, MD;  Location: [facility] ENDOSCOPY;  Service: Cardiovascular;  Laterality: N/A;  . CARDIOVERSION N/A 11/07/2019   Procedure: CARDIOVERSION;  Surgeon: Jerline Pain, MD;  Location[facility] ENDOSCOPY;  Service: Cardiovascular;  Laterality: N/A;  . CARDIOVERSION N/A 03/24/2020   Procedure:  CARDIOVERSION;  Surgeon: [name], MD;  Location: [facility] ENDOSCOPY;  Service: Cardiovascular;  Laterality: N/A;  . CATARACT EXTRACTION     bilateral  . COLONOSCOPY    . DILATION AND CURETTAGE OF UTERUS    . UPPER GI ENDOSCOPY      Current Outpatient Medications  Medication Sig Dispense Refill  . acetaminophen (TYLENOL) 500 MG tablet Take 1,000 mg by mouth 2 (two) times daily as needed for moderate pain.    Marland Kitchen alendronate (FOSAMAX) 70 MG tablet Take 70 mg by mouth every Tuesday.     Marland Kitchen amiodarone (PACERONE) 200 MG tablet Take 1 tablet twice a day for 1 month then reduce to 1 tablet daily 60 tablet 0  . amLODipine (NORVASC) 5 MG tablet Take 0.5 tablets (2.5 mg total) by mouth daily. 90 tablet 2  . Ascorbic Acid (VITAMIN C) 500 MG CAPS Take 500 mg by mouth daily.    . Calcium Carbonate-Vitamin D (CALCIUM 600+D PO) Take 600 mg by mouth daily.    . cholecalciferol (VITAMIN D) 400 units TABS tablet Take 400 Units by mouth daily.    . dorzolamide-timolol (COSOPT) 22.3-6.8 MG/ML ophthalmic solution Place 1 drop into the left eye 2 (two) times daily.    Marland Kitchen ELIQUIS 5 MG TABS tablet TAKE 1 TABLET TWICE A DAY (Patient taking differently: Take 5 mg by mouth 2 (two) times daily.) 180 tablet 1  . gabapentin (NEURONTIN) 100 MG capsule Take 100 mg by mouth at bedtime.    Marland Kitchen levothyroxine (SYNTHROID) 88 MCG tablet Take 88 mcg by mouth daily.    Marland Kitchen loratadine (CLARITIN) 10 MG tablet Take 10 mg by mouth every evening.     Marland Kitchen losartan (COZAAR) 50 MG tablet TAKE 1 TABLET BY MOUTH EVERY DAY (Patient taking differently: Take 50 mg by mouth daily.) 90 tablet 3  . methocarbamol (ROBAXIN) 500 MG tablet Take 1,000 mg by mouth daily in the afternoon.    . metoprolol succinate (TOPROL-XL) 50 MG 24 hr tablet Take 50 mg by mouth daily. Take with or immediately following a meal.    . Multiple Vitamin (MULTIVITAMIN WITH MINERALS) TABS tablet Take 1 tablet by mouth daily.    . Multiple Vitamins-Minerals (HAIR SKIN AND  NAILS FORMULA) TABS Take 2,500 mcg by mouth daily in the afternoon.    . Multiple Vitamins-Minerals (OCUVITE EYE HEATLH GUMMIES PO) Take by mouth. Taking one gummy by mouth daily    . oxybutynin (DITROPAN) 5 MG tablet Take 2.5 mg every evening by mouth.    Marland Kitchen PATADAY 0.2 % SOLN Place 1 drop into both eyes daily as needed (allergies).     . prednisoLONE acetate (PRED FORTE) 1 % ophthalmic suspension Place 1 drop into the left eye at bedtime.    . rosuvastatin (CRESTOR) 5 MG tablet Take 5 mg by mouth every evening.   3  .  SYSTANE ULTRA 0.4-0.3 % SOLN Place 1 drop into both eyes daily as needed (dry eyes).     . traMADol (ULTRAM) 50 MG tablet Take 50 mg by mouth 2 (two) times daily.    Marland Kitchen omeprazole (PRILOSEC) 20 MG capsule Take 20 mg by mouth at bedtime. (Patient not taking: Reported on 03/31/2020)     No current facility-administered medications for this encounter.    Allergies  Allergen Reactions  . Betadine [Povidone Iodine] Itching    With prolonged usage   . Tommas Olp Antonieta Pert Polysaccharide]     Severe constipation     Social History   Socioeconomic History  . Marital status: Widowed    Spouse name: Not on file  . Number of children: 4  . Years of education: Not on file  . Highest education level: Not on file  Occupational History  . Occupation: retired    Fish farm manager: RETIRED  Tobacco Use  . Smoking status: Never Smoker  . Smokeless tobacco: Never Used  Vaping Use  . Vaping Use: Never used  Substance and Sexual Activity  . Alcohol use: No    Alcohol/week: 0.0 standard drinks  . Drug use: No  . Sexual activity: Not on file  Other Topics Concern  . Not on file  Social History Narrative   Daily caffeine   Social Determinants of Health   Financial Resource Strain: Not on file  Food Insecurity: Not on file  Transportation Needs: Not on file  Physical Activity: Not on file  Stress: Not on file  Social Connections: Not on file  Intimate Partner Violence: Not on file     Family History  Problem Relation Age of Onset  . Liver cancer Mother   . Diabetes Father   . Heart disease Father   . Colon cancer Neg Hx     ROS- All systems are reviewed and negative except as per the HPI above  Physical Exam: Vitals:   03/31/20 1436  BP: 138/74  Pulse: (!) 121  Weight: 72.8 kg  Height: 5' 6"  (1.676 m)   Wt Readings from Last 3 Encounters:  03/31/20 72.8 kg  03/24/20 73.5 kg  03/18/20 73.6 kg    Labs: Lab Results  Component Value Date   NA 133 (L) 03/18/2020   K 4.8 03/18/2020   CL 98 03/18/2020   CO2 27 03/18/2020   GLUCOSE 106 (H) 03/18/2020   BUN 17 03/18/2020   CREATININE 0.82 03/18/2020   CALCIUM 9.3 03/18/2020   MG 2.0 12/25/2017   No results found for: INR No results found for: CHOL, HDL, LDLCALC, TRIG   GEN- The patient is well appearing, alert and oriented x 3 today.   Head- normocephalic, atraumatic Eyes-  Sclera clear, conjunctiva pink Ears- hearing intact Oropharynx- clear Neck- supple, no JVP Lymph- no cervical lymphadenopathy Lungs- Clear to ausculation bilaterally, normal work of breathing Heart- irregular rate and rhythm, no murmurs, rubs or gallops, PMI not laterally displaced GI- soft, NT, ND, + BS Extremities- no clubbing, cyanosis, or edema, + for varicose veins  MS- no significant deformity or atrophy Skin- no rash or lesion Psych- euthymic mood, full affect Neuro- strength and sensation are intact  EKG-afib at RVR  121 bpm, NSIVB Epic records reviewed Echo- 7/22/201. The left ventricle has normal systolic function with an ejection  fraction of 60-65%. The cavity size was normal. There is mild concentric  left ventricular hypertrophy. Left ventricular diastolic Doppler  parameters are consistent with  pseudonormalization. Elevated mean left atrial  pressure.  2. The right ventricle has normal systolic function. The cavity was  normal. There is no increase in right ventricular wall thickness. Right   ventricular systolic pressure is normal with an estimated pressure of 36.9  mmHg.  3. Left atrial size was mildly dilated.  4. There is mild to moderate mitral annular calcification present.  5. The aorta is normal in size and structure.     Assessment and Plan: 1. Persistent  afib Cardioversion in August and in December with last cardioversion unsuccessful  We discussed antiarrythmic's as pt is symptomatic with afib, and has RVR I think amiodarone is her only option  Start 200 mg amiodarone bid, I discussed risk vrs benefit of drug, TSH recently checked, she is on levothyroxine and this will need to be monitored closely with the addition of amio, cmet today  Iodine allergy listed in EPic  She does report a rash with betadine in the remote past when she had to soak her foot ,  but no systemic issues She will let me know if she has any itching with amio  No lung issues  Failed tikosyn in the past  Continue  metoprolol succinate 50 mg daily, I will not decrease this with addition of amio as she has RVR today, may need reduction later after amio is loaded   Continue CPAP She has had  covid shots  Hopefully can have an ablation in the near future when SR is resorted and not have to be on drug long term    2. Chadsvasc score of  3 Continue eliquis 5 mg bid   3. HTN Stable     F/u with afib clinic next week for EKG on amio I will see back in office in 2 weeks and then anticipate scheduling  cardioversion the week after     Susan Bell, [facility] [address] [place], [place] [address] [phone]

## 2020-04-01 ENCOUNTER — Other Ambulatory Visit (HOSPITAL_COMMUNITY): Payer: Self-pay | Admitting: Cardiology

## 2020-04-07 ENCOUNTER — Other Ambulatory Visit: Payer: Self-pay

## 2020-04-07 ENCOUNTER — Ambulatory Visit (HOSPITAL_COMMUNITY)
Admission: RE | Admit: 2020-04-07 | Discharge: 2020-04-07 | Disposition: A | Discharge status: Home / Self Care | Payer: Medicare Other | Source: Ambulatory Visit | Attending: Physician Assistant | Admitting: Physician Assistant

## 2020-04-07 VITALS — BP 124/60 | HR 93

## 2020-04-07 DIAGNOSIS — I4819 Other persistent atrial fibrillation: Secondary | ICD-10-CM

## 2020-04-07 DIAGNOSIS — I4891 Unspecified atrial fibrillation: Secondary | ICD-10-CM | POA: Insufficient documentation

## 2020-04-07 DIAGNOSIS — Z79899 Other long term (current) drug therapy: Secondary | ICD-10-CM | POA: Diagnosis not present

## 2020-04-07 NOTE — Progress Notes (Signed)
Patient returns for ECG after starting amiodarone. ECG shows afib with improved heart rate, LBBB Vent. rate 93 BPM, QRS duration 142 ms, QT/QTc 386/479 ms. Will continue amiodarone loading and f/u in two weeks with Roderic Palau to consider DCCV.

## 2020-04-13 DIAGNOSIS — Z1152 Encounter for screening for COVID-19: Secondary | ICD-10-CM | POA: Diagnosis not present

## 2020-04-20 ENCOUNTER — Ambulatory Visit (HOSPITAL_COMMUNITY): Payer: Medicare Other | Admitting: Nurse Practitioner

## 2020-04-22 ENCOUNTER — Other Ambulatory Visit (HOSPITAL_COMMUNITY): Payer: Self-pay | Admitting: Nurse Practitioner

## 2020-04-22 NOTE — Progress Notes (Signed)
Cardiology Office Note   Date:  04/23/2020   ID:  Susan Bell, DOB 05/17/1940, MRN [id number]  PCP:  Shon Baton, MD  Cardiologist:   Minus Breeding, MD   Chief Complaint  Patient presents with  . Atrial Fibrillation      History of Present Illness: Susan Bell is a 80 y.o. female who presents for follow up of atrial fib. She has had persistent atrial fib requiring cardioversion and was on Tikosyn because of recurrence after cardioversion. However, she had recurrence of fib on this and long QT and was taken off of this.   She eventually had another attempted cardioversion but failed.  She was started on amiodarone.    She still atrial fibrillation.  She feels poorly with this.  She denies chest pressure, neck or arm discomfort.  She does not really notice palpitations.  She does notices things like she just feels fatigued when she walks out to the road to the garbage cans.  She feels a little bit dizzy but she has not had any presyncope or syncope.   Past Medical History:  Diagnosis Date  . Allergy    seasonal  . Anemia   . Arthritis   . Cataract    bilateral removed  . Celiac disease   . Eczema   . H/O transfusion of platelets   . Hyperlipidemia   . Hypothyroidism   . OSA (obstructive sleep apnea)   . Osteopenia   . Paroxysmal atrial fibrillation (HCC)   . Shingles   . Vitamin D deficiency     Past Surgical History:  Procedure Laterality Date  . CARDIOVERSION N/A 12/14/2017   Procedure: CARDIOVERSION;  Surgeon: Sanda Klein, MD;  Location: [facility] ENDOSCOPY;  Service: Cardiovascular;  Laterality: N/A;  . CARDIOVERSION N/A 11/07/2019   Procedure: CARDIOVERSION;  Surgeon: Jerline Pain, MD;  Location: [facility] ENDOSCOPY;  Service: Cardiovascular;  Laterality: N/A;  . CARDIOVERSION N/A 03/24/2020   Procedure: CARDIOVERSION;  Surgeon: [name], MD;  Location: [facility] ENDOSCOPY;  Service: Cardiovascular;  Laterality: N/A;  . CATARACT EXTRACTION      bilateral  . COLONOSCOPY    . DILATION AND CURETTAGE OF UTERUS    . UPPER GI ENDOSCOPY       Current Outpatient Medications  Medication Sig Dispense Refill  . acetaminophen (TYLENOL) 500 MG tablet Take 1,000 mg by mouth 2 (two) times daily as needed for moderate pain.    Marland Kitchen alendronate (FOSAMAX) 70 MG tablet Take 70 mg by mouth every Tuesday.     Marland Kitchen amiodarone (PACERONE) 200 MG tablet TAKE 1 TABLET TWICE A DAY FOR 1 MONTH THEN REDUCE TO 1 TABLET DAILY 60 tablet 0  . amLODipine (NORVASC) 5 MG tablet Take 0.5 tablets (2.5 mg total) by mouth daily. 90 tablet 2  . Ascorbic Acid (VITAMIN C) 500 MG CAPS Take 500 mg by mouth daily.    . Calcium Carbonate-Vitamin D (CALCIUM 600+D PO) Take 600 mg by mouth daily.    . cholecalciferol (VITAMIN D) 400 units TABS tablet Take 400 Units by mouth daily.    . dorzolamide-timolol (COSOPT) 22.3-6.8 MG/ML ophthalmic solution Place 1 drop into the left eye 2 (two) times daily.    Marland Kitchen gabapentin (NEURONTIN) 100 MG capsule Take 100 mg by mouth at bedtime.    Marland Kitchen levothyroxine (SYNTHROID) 88 MCG tablet Take 88 mcg by mouth daily.    Marland Kitchen loratadine (CLARITIN) 10 MG tablet Take 10 mg by mouth every evening.     Marland Kitchen  methocarbamol (ROBAXIN) 500 MG tablet Take 1,000 mg by mouth daily in the afternoon.    . metoprolol succinate (TOPROL-XL) 50 MG 24 hr tablet Take 50 mg by mouth daily. Take with or immediately following a meal.    . Multiple Vitamin (MULTIVITAMIN WITH MINERALS) TABS tablet Take 1 tablet by mouth daily.    . Multiple Vitamins-Minerals (HAIR SKIN AND NAILS FORMULA) TABS Take 2,500 mcg by mouth daily in the afternoon.    . Multiple Vitamins-Minerals (OCUVITE EYE HEATLH GUMMIES PO) Take by mouth. Taking one gummy by mouth daily    . omeprazole (PRILOSEC) 20 MG capsule Take 20 mg by mouth at bedtime.    Marland Kitchen oxybutynin (DITROPAN) 5 MG tablet Take 2.5 mg every evening by mouth.    Marland Kitchen PATADAY 0.2 % SOLN Place 1 drop into both eyes daily as needed (allergies).     .  prednisoLONE acetate (PRED FORTE) 1 % ophthalmic suspension Place 1 drop into the left eye at bedtime.    . rosuvastatin (CRESTOR) 5 MG tablet Take 5 mg by mouth every evening.   3  . SYSTANE ULTRA 0.4-0.3 % SOLN Place 1 drop into both eyes daily as needed (dry eyes).     . traMADol (ULTRAM) 50 MG tablet Take 50 mg by mouth 2 (two) times daily.    Marland Kitchen apixaban (ELIQUIS) 5 MG TABS tablet Take 1 tablet (5 mg total) by mouth 2 (two) times daily. 180 tablet 1  . losartan (COZAAR) 50 MG tablet Take 1 tablet (50 mg total) by mouth daily. 90 tablet 3   No current facility-administered medications for this visit.    Allergies:   Betadine [povidone iodine] and Ferrex [iron polysaccharide]    ROS:  Please see the history of present illness.   Otherwise, review of systems are positive for none   All other systems are reviewed and negative.    PHYSICAL EXAM: VS:  BP 140/72   Pulse 79   Ht 5' 5"  (1.651 m)   Wt 160 lb (72.6 kg)   SpO2 98%   BMI 26.63 kg/m  , BMI Body mass index is 26.63 kg/m. GENERAL:  Well appearing NECK:  No jugular venous distention, waveform within normal limits, carotid upstroke brisk and symmetric, no bruits, no thyromegaly LUNGS:  Clear to auscultation bilaterally CHEST:  Unremarkable HEART:  PMI not displaced or sustained,S1 and S2 within normal limits, no S3, no clicks, no rubs, no murmurs, irregular ABD:  Flat, positive bowel sounds normal in frequency in pitch, no bruits, no rebound, no guarding, no midline pulsatile mass, no hepatomegaly, no splenomegaly EXT:  2 plus pulses throughout, no edema, no cyanosis no clubbing   EKG:  EKG is  ordered today. Atrial fibrillation, rate 79, axis within normal limits, QRS interval prolonged with a nonspecific interventricular conduction delay, QTC mildly prolonged  Recent Labs: 03/18/2020: BUN 17; Creatinine, Ser 0.82; Hemoglobin 11.8; Platelets 416; Potassium 4.8; Sodium 133; TSH 3.160 03/31/2020: ALT 17    Lipid Panel No  results found for: CHOL, TRIG, HDL, CHOLHDL, VLDL, LDLCALC, LDLDIRECT    Wt Readings from Last 3 Encounters:  04/23/20 160 lb (72.6 kg)  03/31/20 160 lb 6.4 oz (72.8 kg)  03/24/20 162 lb (73.5 kg)      Other studies Reviewed: Additional studies/ records that were reviewed today include: Atrial Fib Clinic records Review of the above records demonstrates:  Please see elsewhere in the note.     ASSESSMENT AND PLAN:  ATRIAL FIB:  Ms. AISIA CORREIRA has a CHA2DS2 - VASc score of 3.    She is on amiodarone.  She remains symptomatic.  I will have our Atrial Fib clinic arrange cardioversion.   DYSLIPIDEMIA:    141.  HDL 78.  This is being managed byRusso, John, MD   CHRONIC SYSTOLIC HF:    Her EF was 35% but improved to 60 to 65%.  No change in therapy.  OSA:   She tolerates the CPAP.    HTN: Blood pressure is at target.  No change in therapy.   COVID EDUCATION: She has been vaccinated.  Current medicines are reviewed at length with the patient today.  The patient does not have concerns regarding medicines.  The following changes have been made:  None  Labs/ tests ordered today include: None  Orders Placed This Encounter  Procedures  . EKG 12-Lead     Disposition:   FU with me in six months.    Signed, Minus Breeding, MD  04/23/2020 5:47 PM    [facility]

## 2020-04-23 ENCOUNTER — Encounter: Payer: Self-pay | Admitting: Cardiology

## 2020-04-23 ENCOUNTER — Ambulatory Visit (INDEPENDENT_AMBULATORY_CARE_PROVIDER_SITE_OTHER): Payer: Medicare Other | Admitting: Cardiology

## 2020-04-23 ENCOUNTER — Other Ambulatory Visit: Payer: Self-pay

## 2020-04-23 VITALS — BP 140/72 | HR 79 | Ht 65.0 in | Wt 160.0 lb

## 2020-04-23 DIAGNOSIS — I5020 Unspecified systolic (congestive) heart failure: Secondary | ICD-10-CM

## 2020-04-23 DIAGNOSIS — I480 Paroxysmal atrial fibrillation: Secondary | ICD-10-CM | POA: Diagnosis not present

## 2020-04-23 DIAGNOSIS — G4733 Obstructive sleep apnea (adult) (pediatric): Secondary | ICD-10-CM | POA: Diagnosis not present

## 2020-04-23 DIAGNOSIS — E785 Hyperlipidemia, unspecified: Secondary | ICD-10-CM | POA: Diagnosis not present

## 2020-04-23 DIAGNOSIS — M48061 Spinal stenosis, lumbar region without neurogenic claudication: Secondary | ICD-10-CM | POA: Diagnosis not present

## 2020-04-23 DIAGNOSIS — I10 Essential (primary) hypertension: Secondary | ICD-10-CM

## 2020-04-23 MED ORDER — APIXABAN 5 MG PO TABS: 5.0000 mg | ORAL_TABLET | Freq: Two times a day (BID) | ORAL | 1 refills | Status: DC

## 2020-04-23 MED ORDER — LOSARTAN POTASSIUM 50 MG PO TABS: 50.0000 mg | ORAL_TABLET | Freq: Every day | ORAL | 3 refills | Status: DC

## 2020-04-23 NOTE — Patient Instructions (Signed)
Medication Instructions:  The current medical regimen is effective;  continue present plan and medications.  *If you need a refill on your cardiac medications before your next appointment, please call your pharmacy*  Follow-Up: At [facility], you and your health needs are our priority.  As part of our continuing mission to provide you with exceptional heart care, we have created designated Provider Care Teams.  These Care Teams include your primary Cardiologist (physician) and Advanced Practice Providers (APPs -  Physician Assistants and Nurse Practitioners) who all work together to provide you with the care you need, when you need it.  We recommend signing up for the patient portal called "MyChart".  Sign up information is provided on this After Visit Summary.  MyChart is used to connect with patients for Virtual Visits (Telemedicine).  Patients are able to view lab/test results, encounter notes, upcoming appointments, etc.  Non-urgent messages can be sent to your provider as well.   To learn more about what you can do with MyChart, go to [email or web address].    Your next appointment:   Roderic Palau, NP will let you know.

## 2020-04-30 ENCOUNTER — Other Ambulatory Visit: Payer: Self-pay

## 2020-04-30 ENCOUNTER — Encounter (HOSPITAL_COMMUNITY): Payer: Self-pay | Admitting: Nurse Practitioner

## 2020-04-30 ENCOUNTER — Ambulatory Visit (HOSPITAL_COMMUNITY)
Admission: RE | Admit: 2020-04-30 | Discharge: 2020-04-30 | Disposition: A | Discharge status: Home / Self Care | Payer: Medicare Other | Source: Ambulatory Visit | Attending: Nurse Practitioner | Admitting: Nurse Practitioner

## 2020-04-30 VITALS — BP 136/74 | HR 74 | Ht 65.0 in | Wt 163.4 lb

## 2020-04-30 DIAGNOSIS — Z7901 Long term (current) use of anticoagulants: Secondary | ICD-10-CM | POA: Diagnosis not present

## 2020-04-30 DIAGNOSIS — I4819 Other persistent atrial fibrillation: Secondary | ICD-10-CM | POA: Diagnosis not present

## 2020-04-30 DIAGNOSIS — Z79899 Other long term (current) drug therapy: Secondary | ICD-10-CM | POA: Insufficient documentation

## 2020-04-30 DIAGNOSIS — D6869 Other thrombophilia: Secondary | ICD-10-CM

## 2020-04-30 DIAGNOSIS — Z888 Allergy status to other drugs, medicaments and biological substances status: Secondary | ICD-10-CM | POA: Diagnosis not present

## 2020-04-30 DIAGNOSIS — Z8249 Family history of ischemic heart disease and other diseases of the circulatory system: Secondary | ICD-10-CM | POA: Insufficient documentation

## 2020-04-30 DIAGNOSIS — I10 Essential (primary) hypertension: Secondary | ICD-10-CM | POA: Diagnosis not present

## 2020-04-30 LAB — CBC
HCT: 38.5 % (ref 36.0–46.0)
Hemoglobin: 12 g/dL (ref 12.0–15.0)
MCH: 29.6 pg (ref 26.0–34.0)
MCHC: 31.2 g/dL (ref 30.0–36.0)
MCV: 94.8 fL (ref 80.0–100.0)
Platelets: 351 K/uL (ref 150–400)
RBC: 4.06 MIL/uL (ref 3.87–5.11)
RDW: 13.6 % (ref 11.5–15.5)
WBC: 8.5 K/uL (ref 4.0–10.5)
nRBC: 0 % (ref 0.0–0.2)

## 2020-04-30 LAB — BASIC METABOLIC PANEL WITH GFR
Anion gap: 11 (ref 5–15)
BUN: 17 mg/dL (ref 8–23)
CO2: 23 mmol/L (ref 22–32)
Calcium: 9.1 mg/dL (ref 8.9–10.3)
Chloride: 98 mmol/L (ref 98–111)
Creatinine, Ser: 0.81 mg/dL (ref 0.44–1.00)
GFR, Estimated: >60 mL/min
Glucose, Bld: 104 mg/dL — ABNORMAL HIGH (ref 70–99)
Potassium: 4.4 mmol/L (ref 3.5–5.1)
Sodium: 132 mmol/L — ABNORMAL LOW (ref 135–145)

## 2020-04-30 NOTE — H&P (View-Only) (Signed)
Primary Care Physician: Shon Baton, MD Referring Physician: [facility] ER f/u Cardiologist-  [name]  EP- [name] is a 80 y.o. female with a h/o HLD, hypothyroidism  that was successfully cardioverted in the [facility] ER 10/26/17 for new onset Afib. She returned to  the afib office 8/2 but had gone back into  afib and was unaware. She was started on BB for better rate control.   She denied tobacco, alcohol, excessive caffeine. Had been told she snored and appeared to stop breathing at night. Sleep study done 8/30 showed OSA. CPAP started. She had an echo that showed EF at 35% with diffuse hypokinesis. Lexi myoview did not show any ischemia but was an intermediate test.  She was already on an ARB/BB.   She remained in afib, rate controlled. She denied any exertional chest pain, is fatigued and has noted shortness of breath more so with steps. She was admitted for Tikosyn.  However after discharge qt prolonged and Tikosyn was stopped.   Since then she has had a long period of time without any afib until this past Monday. No  specific trigger but afib has been persistent since then. She feels some nausea and fatigue while in afib. Afib in the 64 's today on EKG. Echo in 2020 showed normalization of EF.   F/u afib clinic 11/14/19, following successful cardioversion. ekg today shows  Sinus brady . She feels well, improved in SR. She has had her covid shots a while back.   F/u in afib clinic, 01/02/20. She called to the office last week c/o left leg swelling since August. Rt leg no swelling. Reduced her amlodipine for a few days with mild improvement but still swelling. I scheduled her for an Venous U/S this am which was negative for DVT. Minimal swelling this am. Usually  gets more swelling  during the day and down by the next am. She does wear intermittent support socks. Tries to minimize salt. No pain  associated with the swelling. 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 The U/S   technician mentioned to her that she could see evidence of  incompetent veins.   BP elevated on presentation this am, usually well controlled. On recheck improved to 150/80. She will check again when she gets home.  F/u in afib clinic, 12/15,  for return of afib. Would like  to be cardioverted. No missed anticoagulation. She is in afib in the 90's.  F/u afib clinic, 03/31/20, failed cardioversion, despite multiple shocks. We discussed antiarrythmic's. She has failed tikoyn in the past. She has h/o HF and IVCD so flecainide/Multaq is not an issue. So dicussed amiodarone with pt as it appears to be her only option. I discussed this could be short term bridge  to ablation in the near future as she appears to be a good candidate. But she runs fast in afib so feel she will be better served to get her back in rhythm short term with amiodarone. She is in agreement.   F/u in afib clinic, 04/30/20. She has now been loading on amiodarone x one month so will proceed with cardioversion to see if SR can be restored. Pt is in agreement. No missed doses of anticoagulation.   Today, she denies symptoms of palpitations, chest pain, shortness of breath, orthopnea, PND, lower extremity edema, dizziness, presyncope, syncope, or neurologic sequela. The patient is tolerating medications without difficulties and is otherwise without complaint today.   Past Medical  History:  Diagnosis Date  . Allergy    seasonal  . Anemia   . Arthritis   . Cataract    bilateral removed  . Celiac disease   . Eczema   . H/O transfusion of platelets   . Hyperlipidemia   . Hypothyroidism   . OSA (obstructive sleep apnea)   . Osteopenia   . Paroxysmal atrial fibrillation (HCC)   . Shingles   . Vitamin D deficiency    Past Surgical History:  Procedure Laterality Date  . CARDIOVERSION N/A 12/14/2017   Procedure: CARDIOVERSION;  Surgeon: Sanda Klein, MD;  Location: [facility] ENDOSCOPY;  Service: Cardiovascular;  Laterality: N/A;  .  CARDIOVERSION N/A 11/07/2019   Procedure: CARDIOVERSION;  Surgeon: Jerline Pain, MD;  Location: [facility] ENDOSCOPY;  Service: Cardiovascular;  Laterality: N/A;  . CARDIOVERSION N/A 03/24/2020   Procedure: CARDIOVERSION;  Surgeon: [name], MD;  Location: [facility] ENDOSCOPY;  Service: Cardiovascular;  Laterality: N/A;  . CATARACT EXTRACTION     bilateral  . COLONOSCOPY    . DILATION AND CURETTAGE OF UTERUS    . UPPER GI ENDOSCOPY      Current Outpatient Medications  Medication Sig Dispense Refill  . acetaminophen (TYLENOL) 500 MG tablet Take 1,000 mg by mouth 2 (two) times daily as needed for moderate pain.    Marland Kitchen alendronate (FOSAMAX) 70 MG tablet Take 70 mg by mouth every Tuesday.     Marland Kitchen amiodarone (PACERONE) 200 MG tablet TAKE 1 TABLET TWICE A DAY FOR 1 MONTH THEN REDUCE TO 1 TABLET DAILY 60 tablet 0  . amLODipine (NORVASC) 5 MG tablet Take 0.5 tablets (2.5 mg total) by mouth daily. 90 tablet 2  . apixaban (ELIQUIS) 5 MG TABS tablet Take 1 tablet (5 mg total) by mouth 2 (two) times daily. 180 tablet 1  . Ascorbic Acid (VITAMIN C) 500 MG CAPS Take 500 mg by mouth daily.    . Calcium Carbonate-Vitamin D (CALCIUM 600+D PO) Take 600 mg by mouth daily.    . cholecalciferol (VITAMIN D) 400 units TABS tablet Take 400 Units by mouth daily.    . dorzolamide-timolol (COSOPT) 22.3-6.8 MG/ML ophthalmic solution Place 1 drop into the left eye 2 (two) times daily.    Marland Kitchen gabapentin (NEURONTIN) 100 MG capsule Take 100 mg by mouth at bedtime.    Marland Kitchen levothyroxine (SYNTHROID) 88 MCG tablet Take 88 mcg by mouth daily.    Marland Kitchen loratadine (CLARITIN) 10 MG tablet Take 10 mg by mouth every evening.     Marland Kitchen losartan (COZAAR) 50 MG tablet Take 1 tablet (50 mg total) by mouth daily. 90 tablet 3  . methocarbamol (ROBAXIN) 500 MG tablet Take 1,000 mg by mouth daily in the afternoon.    . metoprolol succinate (TOPROL-XL) 50 MG 24 hr tablet Take 50 mg by mouth daily. Take with or immediately following a meal.    . Multiple  Vitamin (MULTIVITAMIN WITH MINERALS) TABS tablet Take 1 tablet by mouth daily.    . Multiple Vitamins-Minerals (HAIR SKIN AND NAILS FORMULA) TABS Take 2,500 mcg by mouth daily in the afternoon.    . Multiple Vitamins-Minerals (OCUVITE EYE HEATLH GUMMIES PO) Take by mouth. Taking one gummy by mouth daily    . omeprazole (PRILOSEC) 20 MG capsule Take 20 mg by mouth at bedtime.    Marland Kitchen oxybutynin (DITROPAN) 5 MG tablet Take 2.5 mg every evening by mouth.    Marland Kitchen PATADAY 0.2 % SOLN Place 1 drop into both eyes daily as needed (allergies).     Marland Kitchen  prednisoLONE acetate (PRED FORTE) 1 % ophthalmic suspension Place 1 drop into the left eye at bedtime.    . rosuvastatin (CRESTOR) 5 MG tablet Take 5 mg by mouth every evening.   3  . SYSTANE ULTRA 0.4-0.3 % SOLN Place 1 drop into both eyes daily as needed (dry eyes).     . traMADol (ULTRAM) 50 MG tablet Take 50 mg by mouth 2 (two) times daily.     No current facility-administered medications for this encounter.    Allergies  Allergen Reactions  . Betadine [Povidone Iodine] Itching    With prolonged usage   . Tommas Olp Antonieta Pert Polysaccharide]     Severe constipation     Social History   Socioeconomic History  . Marital status: Widowed    Spouse name: Not on file  . Number of children: 4  . Years of education: Not on file  . Highest education level: Not on file  Occupational History  . Occupation: retired    Fish farm manager: RETIRED  Tobacco Use  . Smoking status: Never Smoker  . Smokeless tobacco: Never Used  Vaping Use  . Vaping Use: Never used  Substance and Sexual Activity  . Alcohol use: No    Alcohol/week: 0.0 standard drinks  . Drug use: No  . Sexual activity: Not on file  Other Topics Concern  . Not on file  Social History Narrative   Daily caffeine   Social Determinants of Health   Financial Resource Strain: Not on file  Food Insecurity: Not on file  Transportation Needs: Not on file  Physical Activity: Not on file  Stress: Not on file   Social Connections: Not on file  Intimate Partner Violence: Not on file    Family History  Problem Relation Age of Onset  . Liver cancer Mother   . Diabetes Father   . Heart disease Father   . Colon cancer Neg Hx     ROS- All systems are reviewed and negative except as per the HPI above  Physical Exam: Vitals:   04/30/20 1333  Weight: 74.1 kg  Height: 5' 5"  (1.651 m)   Wt Readings from Last 3 Encounters:  04/30/20 74.1 kg  04/23/20 72.6 kg  03/31/20 72.8 kg    Labs: Lab Results  Component Value Date   NA 133 (L) 03/18/2020   K 4.8 03/18/2020   CL 98 03/18/2020   CO2 27 03/18/2020   GLUCOSE 106 (H) 03/18/2020   BUN 17 03/18/2020   CREATININE 0.82 03/18/2020   CALCIUM 9.3 03/18/2020   MG 2.0 12/25/2017   No results found for: INR No results found for: CHOL, HDL, LDLCALC, TRIG   GEN- The patient is well appearing, alert and oriented x 3 today.   Head- normocephalic, atraumatic Eyes-  Sclera clear, conjunctiva pink Ears- hearing intact Oropharynx- clear Neck- supple, no JVP Lymph- no cervical lymphadenopathy Lungs- Clear to ausculation bilaterally, normal work of breathing Heart- irregular rate and rhythm, no murmurs, rubs or gallops, PMI not laterally displaced GI- soft, NT, ND, + BS Extremities- no clubbing, cyanosis, or edema, + for varicose veins  MS- no significant deformity or atrophy Skin- no rash or lesion Psych- euthymic mood, full affect Neuro- strength and sensation are intact  EKG-afib at  74 bpm,LBBB  Epic records reviewed  Echo- 7/22/201. The left ventricle has normal systolic function with an ejection  fraction of 60-65%. The cavity size was normal. There is mild concentric  left ventricular hypertrophy. Left ventricular diastolic Doppler  parameters  are consistent with  pseudonormalization. Elevated mean left atrial pressure.  2. The right ventricle has normal systolic function. The cavity was  normal. There is no increase in right  ventricular wall thickness. Right  ventricular systolic pressure is normal with an estimated pressure of 36.9  mmHg.  3. Left atrial size was mildly dilated.  4. There is mild to moderate mitral annular calcification present.  5. The aorta is normal in size and structure.     Assessment and Plan: 1. Persistent  afib Cardioversion in August and in December with last cardioversion unsuccessful  We discussed antiarrythmic's as pt is symptomatic with afib, and has RVR I think amiodarone is her only option  She has been on  200 mg amiodarone bid, for last month so will proceed with cardioversion.   She will continue on 200 mg bid until I see back one week after cardioversion than will decrease to one tablet a day  Failed tikosyn in the past  Continue  metoprolol succinate 50 mg daily  Hopefully ablation may be an option  in the near future when SR is resorted so will not have to be on drug long term  She has had  covid vaccines  Cbc/bmet/covid testing    2. Chadsvasc score of  3 Continue eliquis 5 mg bid  No missed doses for at least 3 weeks   3. HTN Stable       Adael Culbreath C. Amiley Shishido, [facility] [address] [place], [place] [address] [phone]

## 2020-04-30 NOTE — Patient Instructions (Signed)
Cardioversion scheduled for Monday, February 7th  - Arrive at the [facility] and go to admitting at 1030AM  - Do not eat or drink anything after midnight the night prior to your procedure.  - Take all your morning medication (except diabetic medications) with a sip of water prior to arrival.  - You will not be able to drive home after your procedure.  - Do NOT miss any doses of your blood thinner - if you should miss a dose please notify our office immediately.  - If you feel as if you go back into normal rhythm prior to scheduled cardioversion, please notify our office immediately. If your procedure is canceled in the cardioversion suite you will be charged a cancellation fee.  Continue Amiodarone 241m twice a day until follow up

## 2020-04-30 NOTE — Progress Notes (Signed)
 Primary Care Physician: Russo, John, MD Referring Physician: [facility] ER f/u Cardiologist-  Dr.  Hochrein  EP- Dr. Taylor  Susan Bell is a 80 y.o. female with a h/o HLD, hypothyroidism  that was successfully cardioverted in the [facility] ER 10/26/17 for new onset Afib. She returned to  the afib office 8/2 but had gone back into  afib and was unaware. She was started on BB for better rate control.   She denied tobacco, alcohol, excessive caffeine. Had been told she snored and appeared to stop breathing at night. Sleep study done 8/30 showed OSA. CPAP started. She had an echo that showed EF at 35% with diffuse hypokinesis. Lexi myoview did not show any ischemia but was an intermediate test.  She was already on an ARB/BB.   She remained in afib, rate controlled. She denied any exertional chest pain, is fatigued and has noted shortness of breath more so with steps. She was admitted for Tikosyn.  However after discharge qt prolonged and Tikosyn was stopped.   Since then she has had a long period of time without any afib until this past Monday. No  specific trigger but afib has been persistent since then. She feels some nausea and fatigue while in afib. Afib in the 90 's today on EKG. Echo in 2020 showed normalization of EF.   F/u afib clinic 11/14/19, following successful cardioversion. ekg today shows  Sinus brady . She feels well, improved in SR. She has had her covid shots a while back.   F/u in afib clinic, 01/02/20. She called to the office last week c/o left leg swelling since August. Rt leg no swelling. Reduced her amlodipine for a few days with mild improvement but still swelling. I scheduled her for an Venous U/S this am which was negative for DVT. Minimal swelling this am. Usually  gets more swelling  during the day and down by the next am. She does wear intermittent support socks. Tries to minimize salt. No pain  associated with the swelling. 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 The U/S   technician mentioned to her that she could see evidence of  incompetent veins.   BP elevated on presentation this am, usually well controlled. On recheck improved to 150/80. She will check again when she gets home.  F/u in afib clinic, 12/15,  for return of afib. Would like  to be cardioverted. No missed anticoagulation. She is in afib in the 90's.  F/u afib clinic, 03/31/20, failed cardioversion, despite multiple shocks. We discussed antiarrythmic's. She has failed tikoyn in the past. She has h/o HF and IVCD so flecainide/Multaq is not an issue. So dicussed amiodarone with pt as it appears to be her only option. I discussed this could be short term bridge  to ablation in the near future as she appears to be a good candidate. But she runs fast in afib so feel she will be better served to get her back in rhythm short term with amiodarone. She is in agreement.   F/u in afib clinic, 04/30/20. She has now been loading on amiodarone x one month so will proceed with cardioversion to see if SR can be restored. Pt is in agreement. No missed doses of anticoagulation.   Today, she denies symptoms of palpitations, chest pain, shortness of breath, orthopnea, PND, lower extremity edema, dizziness, presyncope, syncope, or neurologic sequela. The patient is tolerating medications without difficulties and is otherwise without complaint today.   Past Medical   History:  Diagnosis Date  . Allergy    seasonal  . Anemia   . Arthritis   . Cataract    bilateral removed  . Celiac disease   . Eczema   . H/O transfusion of platelets   . Hyperlipidemia   . Hypothyroidism   . OSA (obstructive sleep apnea)   . Osteopenia   . Paroxysmal atrial fibrillation (HCC)   . Shingles   . Vitamin D deficiency    Past Surgical History:  Procedure Laterality Date  . CARDIOVERSION N/A 12/14/2017   Procedure: CARDIOVERSION;  Surgeon: Croitoru, Mihai, MD;  Location: [facility] ENDOSCOPY;  Service: Cardiovascular;  Laterality: N/A;  .  CARDIOVERSION N/A 11/07/2019   Procedure: CARDIOVERSION;  Surgeon: Skains, Mark C, MD;  Location: [facility] ENDOSCOPY;  Service: Cardiovascular;  Laterality: N/A;  . CARDIOVERSION N/A 03/24/2020   Procedure: CARDIOVERSION;  Surgeon: Pemberton, Heather E, MD;  Location: [facility] ENDOSCOPY;  Service: Cardiovascular;  Laterality: N/A;  . CATARACT EXTRACTION     bilateral  . COLONOSCOPY    . DILATION AND CURETTAGE OF UTERUS    . UPPER GI ENDOSCOPY      Current Outpatient Medications  Medication Sig Dispense Refill  . acetaminophen (TYLENOL) 500 MG tablet Take 1,000 mg by mouth 2 (two) times daily as needed for moderate pain.    . alendronate (FOSAMAX) 70 MG tablet Take 70 mg by mouth every Tuesday.     . amiodarone (PACERONE) 200 MG tablet TAKE 1 TABLET TWICE A DAY FOR 1 MONTH THEN REDUCE TO 1 TABLET DAILY 60 tablet 0  . amLODipine (NORVASC) 5 MG tablet Take 0.5 tablets (2.5 mg total) by mouth daily. 90 tablet 2  . apixaban (ELIQUIS) 5 MG TABS tablet Take 1 tablet (5 mg total) by mouth 2 (two) times daily. 180 tablet 1  . Ascorbic Acid (VITAMIN C) 500 MG CAPS Take 500 mg by mouth daily.    . Calcium Carbonate-Vitamin D (CALCIUM 600+D PO) Take 600 mg by mouth daily.    . cholecalciferol (VITAMIN D) 400 units TABS tablet Take 400 Units by mouth daily.    . dorzolamide-timolol (COSOPT) 22.3-6.8 MG/ML ophthalmic solution Place 1 drop into the left eye 2 (two) times daily.    . gabapentin (NEURONTIN) 100 MG capsule Take 100 mg by mouth at bedtime.    . levothyroxine (SYNTHROID) 88 MCG tablet Take 88 mcg by mouth daily.    . loratadine (CLARITIN) 10 MG tablet Take 10 mg by mouth every evening.     . losartan (COZAAR) 50 MG tablet Take 1 tablet (50 mg total) by mouth daily. 90 tablet 3  . methocarbamol (ROBAXIN) 500 MG tablet Take 1,000 mg by mouth daily in the afternoon.    . metoprolol succinate (TOPROL-XL) 50 MG 24 hr tablet Take 50 mg by mouth daily. Take with or immediately following a meal.    . Multiple  Vitamin (MULTIVITAMIN WITH MINERALS) TABS tablet Take 1 tablet by mouth daily.    . Multiple Vitamins-Minerals (HAIR SKIN AND NAILS FORMULA) TABS Take 2,500 mcg by mouth daily in the afternoon.    . Multiple Vitamins-Minerals (OCUVITE EYE HEATLH GUMMIES PO) Take by mouth. Taking one gummy by mouth daily    . omeprazole (PRILOSEC) 20 MG capsule Take 20 mg by mouth at bedtime.    . oxybutynin (DITROPAN) 5 MG tablet Take 2.5 mg every evening by mouth.    . PATADAY 0.2 % SOLN Place 1 drop into both eyes daily as needed (allergies).     .   prednisoLONE acetate (PRED FORTE) 1 % ophthalmic suspension Place 1 drop into the left eye at bedtime.    . rosuvastatin (CRESTOR) 5 MG tablet Take 5 mg by mouth every evening.   3  . SYSTANE ULTRA 0.4-0.3 % SOLN Place 1 drop into both eyes daily as needed (dry eyes).     . traMADol (ULTRAM) 50 MG tablet Take 50 mg by mouth 2 (two) times daily.     No current facility-administered medications for this encounter.    Allergies  Allergen Reactions  . Betadine [Povidone Iodine] Itching    With prolonged usage   . Ferrex [Iron Polysaccharide]     Severe constipation     Social History   Socioeconomic History  . Marital status: Widowed    Spouse name: Not on file  . Number of children: 4  . Years of education: Not on file  . Highest education level: Not on file  Occupational History  . Occupation: retired    Employer: RETIRED  Tobacco Use  . Smoking status: Never Smoker  . Smokeless tobacco: Never Used  Vaping Use  . Vaping Use: Never used  Substance and Sexual Activity  . Alcohol use: No    Alcohol/week: 0.0 standard drinks  . Drug use: No  . Sexual activity: Not on file  Other Topics Concern  . Not on file  Social History Narrative   Daily caffeine   Social Determinants of Health   Financial Resource Strain: Not on file  Food Insecurity: Not on file  Transportation Needs: Not on file  Physical Activity: Not on file  Stress: Not on file   Social Connections: Not on file  Intimate Partner Violence: Not on file    Family History  Problem Relation Age of Onset  . Liver cancer Mother   . Diabetes Father   . Heart disease Father   . Colon cancer Neg Hx     ROS- All systems are reviewed and negative except as per the HPI above  Physical Exam: Vitals:   04/30/20 1333  Weight: 74.1 kg  Height: 5' 5" (1.651 m)   Wt Readings from Last 3 Encounters:  04/30/20 74.1 kg  04/23/20 72.6 kg  03/31/20 72.8 kg    Labs: Lab Results  Component Value Date   NA 133 (L) 03/18/2020   K 4.8 03/18/2020   CL 98 03/18/2020   CO2 27 03/18/2020   GLUCOSE 106 (H) 03/18/2020   BUN 17 03/18/2020   CREATININE 0.82 03/18/2020   CALCIUM 9.3 03/18/2020   MG 2.0 12/25/2017   No results found for: INR No results found for: CHOL, HDL, LDLCALC, TRIG   GEN- The patient is well appearing, alert and oriented x 3 today.   Head- normocephalic, atraumatic Eyes-  Sclera clear, conjunctiva pink Ears- hearing intact Oropharynx- clear Neck- supple, no JVP Lymph- no cervical lymphadenopathy Lungs- Clear to ausculation bilaterally, normal work of breathing Heart- irregular rate and rhythm, no murmurs, rubs or gallops, PMI not laterally displaced GI- soft, NT, ND, + BS Extremities- no clubbing, cyanosis, or edema, + for varicose veins  MS- no significant deformity or atrophy Skin- no rash or lesion Psych- euthymic mood, full affect Neuro- strength and sensation are intact  EKG-afib at  74 bpm,LBBB  Epic records reviewed  Echo- 7/22/201. The left ventricle has normal systolic function with an ejection  fraction of 60-65%. The cavity size was normal. There is mild concentric  left ventricular hypertrophy. Left ventricular diastolic Doppler  parameters   are consistent with  pseudonormalization. Elevated mean left atrial pressure.  2. The right ventricle has normal systolic function. The cavity was  normal. There is no increase in right  ventricular wall thickness. Right  ventricular systolic pressure is normal with an estimated pressure of 36.9  mmHg.  3. Left atrial size was mildly dilated.  4. There is mild to moderate mitral annular calcification present.  5. The aorta is normal in size and structure.     Assessment and Plan: 1. Persistent  afib Cardioversion in August and in December with last cardioversion unsuccessful  We discussed antiarrythmic's as pt is symptomatic with afib, and has RVR I think amiodarone is her only option  She has been on  200 mg amiodarone bid, for last month so will proceed with cardioversion.   She will continue on 200 mg bid until I see back one week after cardioversion than will decrease to one tablet a day  Failed tikosyn in the past  Continue  metoprolol succinate 50 mg daily  Hopefully ablation may be an option  in the near future when SR is resorted so will not have to be on drug long term  She has had  covid vaccines  Cbc/bmet/covid testing    2. Chadsvasc score of  3 Continue eliquis 5 mg bid  No missed doses for at least 3 weeks   3. HTN Stable       Aluel Schwarz C. Roshaun Pound, ANP-C [facility] [address] [place], [place] [address] [phone]       

## 2020-04-30 NOTE — Addendum Note (Signed)
Encounter addended by: Sherran Needs, NP on: 04/30/2020 1:42 PM  Actions taken: Clinical Note Signed

## 2020-05-09 ENCOUNTER — Other Ambulatory Visit (HOSPITAL_COMMUNITY)
Admission: RE | Admit: 2020-05-09 | Discharge: 2020-05-09 | Disposition: A | Discharge status: Home / Self Care | Payer: Medicare Other | Source: Ambulatory Visit | Attending: Internal Medicine | Admitting: Internal Medicine

## 2020-05-09 DIAGNOSIS — Z20822 Contact with and (suspected) exposure to covid-19: Secondary | ICD-10-CM | POA: Insufficient documentation

## 2020-05-09 DIAGNOSIS — Z01812 Encounter for preprocedural laboratory examination: Secondary | ICD-10-CM | POA: Insufficient documentation

## 2020-05-09 LAB — SARS CORONAVIRUS 2 (TAT 6-24 HRS): SARS Coronavirus 2: NEGATIVE

## 2020-05-11 ENCOUNTER — Encounter (HOSPITAL_COMMUNITY)
Admission: RE | Disposition: A | Discharge status: Home / Self Care | Payer: Self-pay | Source: Home / Self Care | Attending: Internal Medicine

## 2020-05-11 ENCOUNTER — Other Ambulatory Visit: Payer: Self-pay

## 2020-05-11 ENCOUNTER — Ambulatory Visit (HOSPITAL_COMMUNITY): Payer: Medicare Other | Admitting: Anesthesiology

## 2020-05-11 ENCOUNTER — Ambulatory Visit (HOSPITAL_COMMUNITY)
Admission: RE | Admit: 2020-05-11 | Discharge: 2020-05-11 | Disposition: A | Discharge status: Home / Self Care | Payer: Medicare Other | Attending: Internal Medicine | Admitting: Internal Medicine

## 2020-05-11 ENCOUNTER — Encounter (HOSPITAL_COMMUNITY): Payer: Self-pay | Admitting: Internal Medicine

## 2020-05-11 DIAGNOSIS — Z79890 Hormone replacement therapy (postmenopausal): Secondary | ICD-10-CM | POA: Diagnosis not present

## 2020-05-11 DIAGNOSIS — I480 Paroxysmal atrial fibrillation: Secondary | ICD-10-CM | POA: Diagnosis not present

## 2020-05-11 DIAGNOSIS — Z79899 Other long term (current) drug therapy: Secondary | ICD-10-CM | POA: Insufficient documentation

## 2020-05-11 DIAGNOSIS — Z7901 Long term (current) use of anticoagulants: Secondary | ICD-10-CM | POA: Insufficient documentation

## 2020-05-11 DIAGNOSIS — K219 Gastro-esophageal reflux disease without esophagitis: Secondary | ICD-10-CM | POA: Diagnosis not present

## 2020-05-11 DIAGNOSIS — I4891 Unspecified atrial fibrillation: Secondary | ICD-10-CM | POA: Diagnosis not present

## 2020-05-11 DIAGNOSIS — I10 Essential (primary) hypertension: Secondary | ICD-10-CM | POA: Diagnosis not present

## 2020-05-11 DIAGNOSIS — I4819 Other persistent atrial fibrillation: Secondary | ICD-10-CM | POA: Diagnosis not present

## 2020-05-11 DIAGNOSIS — E7800 Pure hypercholesterolemia, unspecified: Secondary | ICD-10-CM | POA: Diagnosis not present

## 2020-05-11 DIAGNOSIS — G4730 Sleep apnea, unspecified: Secondary | ICD-10-CM | POA: Diagnosis not present

## 2020-05-11 HISTORY — PX: CARDIOVERSION: SHX1299

## 2020-05-11 SURGERY — CARDIOVERSION
Anesthesia: Monitor Anesthesia Care

## 2020-05-11 MED ORDER — SODIUM CHLORIDE 0.9 % IV SOLN
INTRAVENOUS | Status: AC | PRN
  Administered 2020-05-11: 500 mL via INTRAVENOUS

## 2020-05-11 MED ORDER — PROPOFOL 10 MG/ML IV BOLUS
INTRAVENOUS | Status: DC | PRN
Start: 2020-05-11 — End: 2020-05-11
  Administered 2020-05-11: 60 mg via INTRAVENOUS

## 2020-05-11 MED ORDER — LIDOCAINE HCL (CARDIAC) PF 100 MG/5ML IV SOSY
PREFILLED_SYRINGE | INTRAVENOUS | Status: DC | PRN
  Administered 2020-05-11: 60 mg via INTRAVENOUS

## 2020-05-11 NOTE — Anesthesia Postprocedure Evaluation (Signed)
Anesthesia Post Note  Patient: Susan Bell  Procedure(s) Performed: CARDIOVERSION (N/A )     Patient location during evaluation: PACU Anesthesia Type: General Level of consciousness: awake and alert and oriented Pain management: pain level controlled Vital Signs Assessment: post-procedure vital signs reviewed and stable Respiratory status: spontaneous breathing, nonlabored ventilation and respiratory function stable Cardiovascular status: blood pressure returned to baseline Postop Assessment: no apparent nausea or vomiting Anesthetic complications: no   No complications documented.  Last Vitals:  Vitals:   05/11/20 1220 05/11/20 1237  BP: (!) 146/47 (!) 165/55  Pulse: (!) 53 (!) 54  Resp: 12 13  Temp:    SpO2: 98% 97%    Last Pain:  Vitals:   05/11/20 1237  TempSrc:   PainSc: 0-No pain                 Brennan Bailey

## 2020-05-11 NOTE — Anesthesia Preprocedure Evaluation (Signed)
Anesthesia Evaluation  Patient identified by MRN, date of birth, ID band Patient awake    Reviewed: Allergy & Precautions, NPO status , Patient's Chart, lab work & pertinent test results  History of Anesthesia Complications Negative for: history of anesthetic complications  Airway Mallampati: II  TM Distance: >3 FB Neck ROM: Full    Dental no notable dental hx.    Pulmonary sleep apnea ,    Pulmonary exam normal        Cardiovascular hypertension, Pt. on medications Normal cardiovascular exam+ dysrhythmias Atrial Fibrillation      Neuro/Psych negative neurological ROS  negative psych ROS   GI/Hepatic Neg liver ROS, GERD  Controlled and Medicated,  Endo/Other  Hypothyroidism   Renal/GU negative Renal ROS  negative genitourinary   Musculoskeletal  (+) Arthritis ,   Abdominal   Peds  Hematology negative hematology ROS (+)   Anesthesia Other Findings Day of surgery medications reviewed with patient.  Reproductive/Obstetrics negative OB ROS                             Anesthesia Physical Anesthesia Plan  ASA: III  Anesthesia Plan: General   Post-op Pain Management:    Induction: Intravenous  PONV Risk Score and Plan: Treatment may vary due to age or medical condition and Propofol infusion  Airway Management Planned: Mask  Additional Equipment: None  Intra-op Plan:   Post-operative Plan:   Informed Consent:   Plan Discussed with:   Anesthesia Plan Comments:         Anesthesia Quick Evaluation

## 2020-05-11 NOTE — CV Procedure (Signed)
CARDIOVERSION  Pt anesthetized by anesthesia with Propofol intravenously  With pads in AP position, pt cardioverted to SR with 200 J synchronized biphasic energy  12 lead EKG pending   Procedure was without complication  Dorris Carnes MD

## 2020-05-11 NOTE — Discharge Instructions (Signed)
Electrical Cardioversion Electrical cardioversion is the delivery of a jolt of electricity to restore a normal rhythm to the heart. A rhythm that is too fast or is not regular keeps the heart from pumping well. In this procedure, sticky patches or metal paddles are placed on the chest to deliver electricity to the heart from a device. This procedure may be done in an emergency if:  There is low or no blood pressure as a result of the heart rhythm.  Normal rhythm must be restored as fast as possible to protect the brain and heart from further damage.  It may save a life. This may also be a scheduled procedure for irregular or fast heart rhythms that are not immediately life-threatening. Tell a health care provider about:  Any allergies you have.  All medicines you are taking, including vitamins, herbs, eye drops, creams, and over-the-counter medicines.  Any problems you or family members have had with anesthetic medicines.  Any blood disorders you have.  Any surgeries you have had.  Any medical conditions you have.  Whether you are pregnant or may be pregnant. What are the risks? Generally, this is a safe procedure. However, problems may occur, including:  Allergic reactions to medicines.  A blood clot that breaks free and travels to other parts of your body.  The possible return of an abnormal heart rhythm within hours or days after the procedure.  Your heart stopping (cardiac arrest). This is rare. What happens before the procedure? Medicines  Your health care provider may have you start taking: ? Blood-thinning medicines (anticoagulants) so your blood does not clot as easily. ? Medicines to help stabilize your heart rate and rhythm.  Ask your health care provider about: ? Changing or stopping your regular medicines. This is especially important if you are taking diabetes medicines or blood thinners. ? Taking medicines such as aspirin and ibuprofen. These medicines can  thin your blood. Do not take these medicines unless your health care provider tells you to take them. ? Taking over-the-counter medicines, vitamins, herbs, and supplements. General instructions  Follow instructions from your health care provider about eating or drinking restrictions.  Plan to have someone take you home from the hospital or clinic.  If you will be going home right after the procedure, plan to have someone with you for 24 hours.  Ask your health care provider what steps will be taken to help prevent infection. These may include washing your skin with a germ-killing soap. What happens during the procedure?  An IV will be inserted into one of your veins.  Sticky patches (electrodes) or metal paddles may be placed on your chest.  You will be given a medicine to help you relax (sedative).  An electrical shock will be delivered. The procedure may vary among health care providers and hospitals.   What can I expect after the procedure?  Your blood pressure, heart rate, breathing rate, and blood oxygen level will be monitored until you leave the hospital or clinic.  Your heart rhythm will be watched to make sure it does not change.  You may have some redness on the skin where the shocks were given. Follow these instructions at home:  Do not drive for 24 hours if you were given a sedative during your procedure.  Take over-the-counter and prescription medicines only as told by your health care provider.  Ask your health care provider how to check your pulse. Check it often.  Rest for 48 hours after the procedure   or as told by your health care provider.  Avoid or limit your caffeine use as told by your health care provider.  Keep all follow-up visits as told by your health care provider. This is important. Contact a health care provider if:  You feel like your heart is beating too quickly or your pulse is not regular.  You have a serious muscle cramp that does not go  away. Get help right away if:  You have discomfort in your chest.  You are dizzy or you feel faint.  You have trouble breathing or you are short of breath.  Your speech is slurred.  You have trouble moving an arm or leg on one side of your body.  Your fingers or toes turn cold or blue. Summary  Electrical cardioversion is the delivery of a jolt of electricity to restore a normal rhythm to the heart.  This procedure may be done right away in an emergency or may be a scheduled procedure if the condition is not an emergency.  Generally, this is a safe procedure.  After the procedure, check your pulse often as told by your health care provider. This information is not intended to replace advice given to you by your health care provider. Make sure you discuss any questions you have with your health care provider. Document Revised: 10/22/2018 Document Reviewed: 10/22/2018 Elsevier Patient Education  2021 Elsevier Inc.  

## 2020-05-11 NOTE — Transfer of Care (Signed)
Immediate Anesthesia Transfer of Care Note  Patient: Susan Bell  Procedure(s) Performed: CARDIOVERSION (N/A )  Patient Location: Endoscopy Unit  Anesthesia Type:General  Level of Consciousness: awake, alert  and oriented  Airway & Oxygen Therapy: Patient Spontanous Breathing  Post-op Assessment: Report given to RN and Post -op Vital signs reviewed and stable  Post vital signs: Reviewed and stable  Last Vitals:  Vitals Value Taken Time  BP    Temp    Pulse    Resp    SpO2      Last Pain:  Vitals:   05/11/20 1100  TempSrc: Oral  PainSc: 0-No pain         Complications: No complications documented.

## 2020-05-11 NOTE — Interval H&P Note (Signed)
History and Physical Interval Note:  05/11/2020 11:50 AM  Susan Bell  has presented today for surgery, with the diagnosis of AFIB.  The various methods of treatment have been discussed with the patient and family. After consideration of risks, benefits and other options for treatment, the patient has consented to  Procedure(s): CARDIOVERSION (N/A) as a surgical intervention.  The patient's history has been reviewed, patient examined, no change in status, stable for surgery.  I have reviewed the patient's chart and labs.  Questions were answered to the patient's satisfaction.     Dorris Carnes

## 2020-05-11 NOTE — Anesthesia Procedure Notes (Signed)
Procedure Name: General with mask airway Date/Time: 05/11/2020 11:54 AM Performed by: Mariea Clonts, CRNA Pre-anesthesia Checklist: Timeout performed, Patient being monitored, Suction available, Emergency Drugs available and Patient identified Patient Re-evaluated:Patient Re-evaluated prior to induction Oxygen Delivery Method: Ambu bag

## 2020-05-13 ENCOUNTER — Encounter (HOSPITAL_COMMUNITY): Payer: Self-pay | Admitting: Internal Medicine

## 2020-05-15 ENCOUNTER — Other Ambulatory Visit (HOSPITAL_COMMUNITY): Payer: Self-pay | Admitting: Nurse Practitioner

## 2020-05-18 ENCOUNTER — Other Ambulatory Visit: Payer: Self-pay

## 2020-05-18 ENCOUNTER — Encounter (HOSPITAL_COMMUNITY): Payer: Self-pay | Admitting: Nurse Practitioner

## 2020-05-18 ENCOUNTER — Ambulatory Visit (HOSPITAL_COMMUNITY)
Admission: RE | Admit: 2020-05-18 | Discharge: 2020-05-18 | Disposition: A | Discharge status: Home / Self Care | Payer: Medicare Other | Source: Ambulatory Visit | Attending: Nurse Practitioner | Admitting: Nurse Practitioner

## 2020-05-18 VITALS — BP 174/76 | HR 52 | Ht 65.0 in | Wt 169.0 lb

## 2020-05-18 DIAGNOSIS — Z79899 Other long term (current) drug therapy: Secondary | ICD-10-CM | POA: Insufficient documentation

## 2020-05-18 DIAGNOSIS — E039 Hypothyroidism, unspecified: Secondary | ICD-10-CM | POA: Diagnosis not present

## 2020-05-18 DIAGNOSIS — I10 Essential (primary) hypertension: Secondary | ICD-10-CM | POA: Insufficient documentation

## 2020-05-18 DIAGNOSIS — Z8249 Family history of ischemic heart disease and other diseases of the circulatory system: Secondary | ICD-10-CM | POA: Diagnosis not present

## 2020-05-18 DIAGNOSIS — I4819 Other persistent atrial fibrillation: Secondary | ICD-10-CM | POA: Diagnosis not present

## 2020-05-18 DIAGNOSIS — E785 Hyperlipidemia, unspecified: Secondary | ICD-10-CM | POA: Diagnosis not present

## 2020-05-18 DIAGNOSIS — Z7901 Long term (current) use of anticoagulants: Secondary | ICD-10-CM | POA: Insufficient documentation

## 2020-05-18 DIAGNOSIS — D6869 Other thrombophilia: Secondary | ICD-10-CM

## 2020-05-18 DIAGNOSIS — G4733 Obstructive sleep apnea (adult) (pediatric): Secondary | ICD-10-CM | POA: Diagnosis not present

## 2020-05-18 LAB — HEPATIC FUNCTION PANEL
ALT: 23 U/L (ref 0–44)
AST: 25 U/L (ref 15–41)
Albumin: 3.5 g/dL (ref 3.5–5.0)
Alkaline Phosphatase: 50 U/L (ref 38–126)
Bilirubin, Direct: 0.1 mg/dL (ref 0.0–0.2)
Indirect Bilirubin: 0.5 mg/dL (ref 0.3–0.9)
Total Bilirubin: 0.6 mg/dL (ref 0.3–1.2)
Total Protein: 6.4 g/dL — ABNORMAL LOW (ref 6.5–8.1)

## 2020-05-18 LAB — TSH: TSH: 12.729 u[IU]/mL — ABNORMAL HIGH (ref 0.350–4.500)

## 2020-05-18 MED ORDER — AMIODARONE HCL 200 MG PO TABS: 200.0000 mg | ORAL_TABLET | Freq: Every day | ORAL | 1 refills | Status: DC

## 2020-05-18 MED ORDER — AMLODIPINE BESYLATE 5 MG PO TABS: 5.0000 mg | ORAL_TABLET | Freq: Every day | ORAL | 2 refills | Status: DC

## 2020-05-18 NOTE — Patient Instructions (Signed)
Decrease amiodarone to 279m once a day  Keep log of your blood pressures and call with update in a week.

## 2020-05-18 NOTE — Progress Notes (Addendum)
Primary Care Physician: Susan Baton, MD Referring Physician: [address] [facility] f/u Cardiologist-  Dr.  Percival Bell  EP- Dr. Marlana Bell is a 80 y.o. female with a h/o HLD, hypothyroidism  that was successfully cardioverted in the [facility] ER 10/26/17 for new onset Afib. She returned to  the afib office 8/2 but had gone back into  afib and was unaware. She was started on BB for better rate control.   She denied tobacco, alcohol, excessive caffeine. Had been told she snored and appeared to stop breathing at night. Sleep study done 8/30 showed OSA. CPAP started. She had an echo that showed EF at 35% with diffuse hypokinesis. Lexi myoview did not show any ischemia but was an intermediate test.  She was already on an ARB/BB.   She remained in afib, rate controlled. She denied any exertional chest Bell, is fatigued and has noted shortness of breath more so with steps. She was admitted for Tikosyn.  However after discharge qt prolonged and Tikosyn was stopped.   Since then she has had a long period of time without any afib until this past Monday. No  specific trigger but afib has been persistent since then. She feels some nausea and fatigue while in afib. Afib in the 63 's today on EKG. Echo in 2020 showed normalization of EF.   F/u afib clinic 11/14/19, following successful cardioversion. ekg today shows  Sinus brady . She feels well, improved in SR. She has had her covid shots a while back.   F/u in afib clinic, 01/02/20. She called to the office last week c/o left leg swelling since August. Rt leg no swelling. Reduced her amlodipine for a few days with mild improvement but still swelling. I scheduled her for an Venous U/S this am which was negative for DVT. Minimal swelling this am. Usually  gets more swelling  during the day and down by the next am. She does wear intermittent support socks. Tries to minimize salt. No Bell  associated with the swelling. 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 The U/S   technician mentioned to her that she could see evidence of  incompetent veins.   BP elevated on presentation this am, usually well controlled. On recheck improved to 150/80. She will check again when she gets home.  F/u in afib clinic, 12/15,  for return of afib. Would like  to be cardioverted. No missed anticoagulation. She is in afib in the 90's.  F/u afib clinic, 03/31/20, failed cardioversion, despite multiple shocks. We discussed antiarrythmic's. She has failed tikoyn in the past. She has h/o HF and IVCD so flecainide/Multaq is not an issue. So dicussed amiodarone with pt as it appears to be her only option. I discussed this could be short term bridge  to ablation in the near future as she appears to be a good candidate. But she runs fast in afib so feel she will be better served to get her back in rhythm short term with amiodarone. She is in agreement.   F/u in afib clinic, 04/30/20. She has now been loading on amiodarone x one month so will proceed with cardioversion to see if SR can be restored. Pt is in agreement. No missed doses of anticoagulation.   F/u in afib clinic, 05/18/20. She did have successful cardioversion 05/11/20 and ekg today shows SR.   Today, she denies symptoms of palpitations, chest Bell, shortness of breath, orthopnea, PND, lower extremity edema, dizziness, presyncope, syncope, or neurologic  sequela. The patient is tolerating medications without difficulties and is otherwise without complaint today.   Past Medical History:  Diagnosis Date  . Allergy    seasonal  . Anemia   . Arthritis   . Cataract    bilateral removed  . Celiac disease   . Eczema   . H/O transfusion of platelets   . Hyperlipidemia   . Hypothyroidism   . OSA (obstructive sleep apnea)   . Osteopenia   . Paroxysmal atrial fibrillation (HCC)   . Shingles   . Vitamin D deficiency    Past Surgical History:  Procedure Laterality Date  . CARDIOVERSION N/A 12/14/2017   Procedure: CARDIOVERSION;   Surgeon: Susan Klein, MD;  Location: [facility] ENDOSCOPY;  Service: Cardiovascular;  Laterality: N/A;  . CARDIOVERSION N/A 11/07/2019   Procedure: CARDIOVERSION;  Surgeon: Susan Pain, MD;  Location: [facility] ENDOSCOPY;  Service: Cardiovascular;  Laterality: N/A;  . CARDIOVERSION N/A 03/24/2020   Procedure: CARDIOVERSION;  Surgeon: Susan Bergeron, MD;  Location: [facility] ENDOSCOPY;  Service: Cardiovascular;  Laterality: N/A;  . CARDIOVERSION N/A 05/11/2020   Procedure: CARDIOVERSION;  Surgeon: Susan Records, MD;  Location: [place];  Service: Cardiovascular;  Laterality: N/A;  . CATARACT EXTRACTION     bilateral  . COLONOSCOPY    . DILATION AND CURETTAGE OF UTERUS    . UPPER GI ENDOSCOPY      Current Outpatient Medications  Medication Sig Dispense Refill  . acetaminophen (TYLENOL) 500 MG tablet Take 1,000 mg by mouth 2 (two) times daily as needed for moderate Bell.    Marland Kitchen alendronate (FOSAMAX) 70 MG tablet Take 70 mg by mouth every Tuesday.     Marland Kitchen amiodarone (PACERONE) 200 MG tablet Take 1 tablet (200 mg total) by mouth 2 (two) times daily. 60 tablet 0  . amLODipine (NORVASC) 5 MG tablet Take 0.5 tablets (2.5 mg total) by mouth daily. 90 tablet 2  . apixaban (ELIQUIS) 5 MG TABS tablet Take 1 tablet (5 mg total) by mouth 2 (two) times daily. 180 tablet 1  . Ascorbic Acid (VITAMIN C) 500 MG CAPS Take 500 mg by mouth daily.    . Calcium Carbonate-Vitamin D (CALCIUM 600+D PO) Take 1 tablet by mouth daily.    . cholecalciferol (VITAMIN D) 400 units TABS tablet Take 400 Units by mouth daily.    . dorzolamide-timolol (COSOPT) 22.3-6.8 MG/ML ophthalmic solution Place 1 drop into the left eye 2 (two) times daily.    Marland Kitchen gabapentin (NEURONTIN) 100 MG capsule Take 100 mg by mouth at bedtime.    Marland Kitchen levothyroxine (SYNTHROID) 88 MCG tablet Take 88 mcg by mouth daily.    Marland Kitchen loratadine (CLARITIN) 10 MG tablet Take 10 mg by mouth every evening.     Marland Kitchen losartan (COZAAR) 50 MG tablet Take 1 tablet (50 mg total) by mouth  daily. 90 tablet 3  . Menthol-Methyl Salicylate (SALONPAS Bell RELIEF PATCH EX) Apply 1 patch topically daily as needed (Bell).    . methocarbamol (ROBAXIN) 500 MG tablet Take 500 mg by mouth daily as needed for muscle spasms.    . metoprolol succinate (TOPROL-XL) 50 MG 24 hr tablet Take 50 mg by mouth daily. Take with or immediately following a meal.    . Multiple Vitamin (MULTIVITAMIN WITH MINERALS) TABS tablet Take 1 tablet by mouth daily.    . Multiple Vitamins-Minerals (HAIR SKIN AND NAILS FORMULA) TABS Take 1 tablet by mouth daily in the afternoon.    . Multiple Vitamins-Minerals ([place]  PO) Take 1 capsule by mouth daily.    Marland Kitchen omeprazole (PRILOSEC) 20 MG capsule Take 20 mg by mouth at bedtime.    Marland Kitchen oxybutynin (DITROPAN) 5 MG tablet Take 2.5 mg every evening by mouth.    Marland Kitchen PATADAY 0.2 % SOLN Place 1 drop into both eyes daily as needed (allergies).     . prednisoLONE acetate (PRED FORTE) 1 % ophthalmic suspension Place 1 drop into the left eye at bedtime.    . rosuvastatin (CRESTOR) 5 MG tablet Take 5 mg by mouth every evening.   3  . SYSTANE ULTRA 0.4-0.3 % SOLN Place 1 drop into both eyes daily as needed (dry eyes).     . traMADol (ULTRAM) 50 MG tablet Take 50 mg by mouth 2 (two) times daily as needed for moderate Bell.     No current facility-administered medications for this encounter.    Allergies  Allergen Reactions  . Betadine [Povidone Iodine] Itching    With prolonged usage   . Tommas Olp Antonieta Pert Polysaccharide]     Severe constipation     Social History   Socioeconomic History  . Marital status: Widowed    Spouse name: Not on file  . Number of children: 4  . Years of education: Not on file  . Highest education level: Not on file  Occupational History  . Occupation: retired    Fish farm manager: RETIRED  Tobacco Use  . Smoking status: Never Smoker  . Smokeless tobacco: Never Used  Vaping Use  . Vaping Use: Never used  Substance and Sexual Activity  .  Alcohol use: No    Alcohol/week: 0.0 standard drinks  . Drug use: No  . Sexual activity: Not on file  Other Topics Concern  . Not on file  Social History Narrative   Daily caffeine   Social Determinants of Health   Financial Resource Strain: Not on file  Food Insecurity: Not on file  Transportation Needs: Not on file  Physical Activity: Not on file  Stress: Not on file  Social Connections: Not on file  Intimate Partner Violence: Not on file    Family History  Problem Relation Age of Onset  . Liver cancer Mother   . Diabetes Father   . Heart disease Father   . Colon cancer Neg Hx     ROS- All systems are reviewed and negative except as per the HPI above  Physical Exam: There were no vitals filed for this visit. Wt Readings from Last 3 Encounters:  05/11/20 74.1 kg  04/30/20 74.1 kg  04/23/20 72.6 kg    Labs: Lab Results  Component Value Date   NA 132 (L) 04/30/2020   K 4.4 04/30/2020   CL 98 04/30/2020   CO2 23 04/30/2020   GLUCOSE 104 (H) 04/30/2020   BUN 17 04/30/2020   CREATININE 0.81 04/30/2020   CALCIUM 9.1 04/30/2020   MG 2.0 12/25/2017   No results found for: INR No results found for: CHOL, HDL, LDLCALC, TRIG   GEN- The patient is well appearing, alert and oriented x 3 today.   Head- normocephalic, atraumatic Eyes-  Sclera clear, conjunctiva pink Ears- hearing intact Oropharynx- clear Neck- supple, no JVP Lymph- no cervical lymphadenopathy Lungs- Clear to ausculation bilaterally, normal work of breathing Heart-  regular rate and rhythm, no murmurs, rubs or gallops, PMI not laterally displaced GI- soft, NT, ND, + BS Extremities- no clubbing, cyanosis, or edema, + for varicose veins  MS- no significant deformity or atrophy Skin- no rash  or lesion Psych- euthymic mood, full affect Neuro- strength and sensation are intact  EKG-Sinus brady at 52 bpm with first degree AV block, pr int 226 ms, qrs int 144 ms, qtc 399 ms Epic Bell  reviewed  Echo- 7/22/201. The left ventricle has normal systolic function with an ejection  fraction of 60-65%. The cavity size was normal. There is mild concentric  left ventricular hypertrophy. Left ventricular diastolic Doppler  parameters are consistent with  pseudonormalization. Elevated mean left atrial pressure.  2. The right ventricle has normal systolic function. The cavity was  normal. There is no increase in right ventricular wall thickness. Right  ventricular systolic pressure is normal with an estimated pressure of 36.9  mmHg.  3. Left atrial size was mildly dilated.  4. There is mild to moderate mitral annular calcification present.  5. The aorta is normal in size and structure.     Assessment and Plan: 1. Persistent  afib Cardioversion in August and in [place] with last cardioversion unsuccessful  We discussed antiarrythmic's as pt is symptomatic with afib, and has RVR I think amiodarone is her only option  Failed tikosyn in the past  She was loaded  on  200 mg amiodarone bid, for last month and then  proceeded with successful  Cardioversion Reduce amiodarone to 200 mg daily She remains in SR today   Liver panel/tsh today  Continue  metoprolol succinate 50 mg daily  Possibly  ablation may be an option  in the near future when SR is resorted so will not have to be on drug long term    2. Chadsvasc score of  3 Continue eliquis 5 mg bid   3. HTN Elevated today, at home this am 462 systolic She will check BP 2x a day until Friday and report the readings to the office  If systolic's are consistently over 150, I will increase amlodipine to 10 mg daily(currently taking 5 mg daily)   F/u in 2 months at which time I will see if pt is still wanting to see EP re ablation or continue on amiodarone   Susan Bell, [facility] [address] [place], [place] [address] [phone]

## 2020-05-22 ENCOUNTER — Telehealth (HOSPITAL_COMMUNITY): Payer: Self-pay | Admitting: *Deleted

## 2020-05-22 MED ORDER — AMLODIPINE BESYLATE 5 MG PO TABS: 5.0000 mg | ORAL_TABLET | Freq: Every day | ORAL | 2 refills | Status: DC

## 2020-05-22 NOTE — Telephone Encounter (Signed)
Patient calling with update of BP after increasing amlodipine to 3m a day.  2/15 - 145/60, 140/57 2/16 - 134/54, 138/58 2/17 - 138/60 2/18 - 143/59  Per DRoderic PalauNP last note - will continue amlodipine at 563mdaily. Pt will call if issues arise.

## 2020-05-28 DIAGNOSIS — Z9889 Other specified postprocedural states: Secondary | ICD-10-CM | POA: Diagnosis not present

## 2020-05-28 DIAGNOSIS — H40013 Open angle with borderline findings, low risk, bilateral: Secondary | ICD-10-CM | POA: Diagnosis not present

## 2020-05-28 DIAGNOSIS — Z947 Corneal transplant status: Secondary | ICD-10-CM | POA: Diagnosis not present

## 2020-05-29 ENCOUNTER — Telehealth (HOSPITAL_COMMUNITY): Payer: Self-pay | Admitting: *Deleted

## 2020-05-29 NOTE — Telephone Encounter (Signed)
Patient called in stating today she has felt "lousy" checked her HR and it is staying in the upper 40s. No energy, fatigued denies CP or SOB.  Discussed with Roderic Palau NP will decrease metoprolol to 76m once a day and call Monday with  Update of symptoms/vitals. Pt in agreement.

## 2020-06-02 DIAGNOSIS — M170 Bilateral primary osteoarthritis of knee: Secondary | ICD-10-CM | POA: Diagnosis not present

## 2020-06-02 DIAGNOSIS — M1712 Unilateral primary osteoarthritis, left knee: Secondary | ICD-10-CM | POA: Diagnosis not present

## 2020-06-02 DIAGNOSIS — M25512 Pain in left shoulder: Secondary | ICD-10-CM | POA: Diagnosis not present

## 2020-06-02 NOTE — Telephone Encounter (Signed)
Pt feeling better after reduction of metoprolol. HR 59. She is continuing to watch her BP as it has started to increase.after the metoprolol dose was reduced. Patient will follow her BP over the next several days and touch base with update.

## 2020-06-08 DIAGNOSIS — E559 Vitamin D deficiency, unspecified: Secondary | ICD-10-CM | POA: Diagnosis not present

## 2020-06-08 DIAGNOSIS — E785 Hyperlipidemia, unspecified: Secondary | ICD-10-CM | POA: Diagnosis not present

## 2020-06-08 DIAGNOSIS — E039 Hypothyroidism, unspecified: Secondary | ICD-10-CM | POA: Diagnosis not present

## 2020-06-09 ENCOUNTER — Other Ambulatory Visit (HOSPITAL_COMMUNITY): Payer: Self-pay | Admitting: Nurse Practitioner

## 2020-06-10 NOTE — Telephone Encounter (Signed)
Pt BP readings last several days. Sat 141/57 HR 60 Sun 136/57 HR 60 Mon 137/60 HR 49 Tues 148/57 HR 52 Wed 135/53 HR 50  Feeling ok no dizziness noted. She will continue current medication regimen.

## 2020-06-15 DIAGNOSIS — Z1331 Encounter for screening for depression: Secondary | ICD-10-CM | POA: Diagnosis not present

## 2020-06-15 DIAGNOSIS — I480 Paroxysmal atrial fibrillation: Secondary | ICD-10-CM | POA: Diagnosis not present

## 2020-06-15 DIAGNOSIS — I2510 Atherosclerotic heart disease of native coronary artery without angina pectoris: Secondary | ICD-10-CM | POA: Diagnosis not present

## 2020-06-15 DIAGNOSIS — Z1339 Encounter for screening examination for other mental health and behavioral disorders: Secondary | ICD-10-CM | POA: Diagnosis not present

## 2020-06-15 DIAGNOSIS — F325 Major depressive disorder, single episode, in full remission: Secondary | ICD-10-CM | POA: Diagnosis not present

## 2020-06-15 DIAGNOSIS — I5022 Chronic systolic (congestive) heart failure: Secondary | ICD-10-CM | POA: Diagnosis not present

## 2020-06-15 DIAGNOSIS — Z0000 Encounter for general adult medical examination without abnormal findings: Secondary | ICD-10-CM | POA: Diagnosis not present

## 2020-06-15 DIAGNOSIS — R82998 Other abnormal findings in urine: Secondary | ICD-10-CM | POA: Diagnosis not present

## 2020-06-15 DIAGNOSIS — D6869 Other thrombophilia: Secondary | ICD-10-CM | POA: Diagnosis not present

## 2020-06-15 DIAGNOSIS — K900 Celiac disease: Secondary | ICD-10-CM | POA: Diagnosis not present

## 2020-06-15 DIAGNOSIS — I110 Hypertensive heart disease with heart failure: Secondary | ICD-10-CM | POA: Diagnosis not present

## 2020-06-15 DIAGNOSIS — Z7901 Long term (current) use of anticoagulants: Secondary | ICD-10-CM | POA: Diagnosis not present

## 2020-06-15 DIAGNOSIS — E785 Hyperlipidemia, unspecified: Secondary | ICD-10-CM | POA: Diagnosis not present

## 2020-06-15 DIAGNOSIS — I2584 Coronary atherosclerosis due to calcified coronary lesion: Secondary | ICD-10-CM | POA: Diagnosis not present

## 2020-06-15 DIAGNOSIS — D692 Other nonthrombocytopenic purpura: Secondary | ICD-10-CM | POA: Diagnosis not present

## 2020-06-24 DIAGNOSIS — H40013 Open angle with borderline findings, low risk, bilateral: Secondary | ICD-10-CM | POA: Diagnosis not present

## 2020-07-06 ENCOUNTER — Other Ambulatory Visit (HOSPITAL_COMMUNITY): Payer: Self-pay | Admitting: Nurse Practitioner

## 2020-07-08 DIAGNOSIS — H838X3 Other specified diseases of inner ear, bilateral: Secondary | ICD-10-CM | POA: Diagnosis not present

## 2020-07-08 DIAGNOSIS — H903 Sensorineural hearing loss, bilateral: Secondary | ICD-10-CM | POA: Diagnosis not present

## 2020-07-13 DIAGNOSIS — E039 Hypothyroidism, unspecified: Secondary | ICD-10-CM | POA: Diagnosis not present

## 2020-07-22 ENCOUNTER — Encounter (HOSPITAL_COMMUNITY): Payer: Self-pay | Admitting: Nurse Practitioner

## 2020-07-22 ENCOUNTER — Ambulatory Visit (HOSPITAL_COMMUNITY)
Admission: RE | Admit: 2020-07-22 | Discharge: 2020-07-22 | Disposition: A | Discharge status: Home / Self Care | Payer: Medicare Other | Source: Ambulatory Visit | Attending: Nurse Practitioner | Admitting: Nurse Practitioner

## 2020-07-22 ENCOUNTER — Other Ambulatory Visit: Payer: Self-pay

## 2020-07-22 VITALS — BP 124/50 | HR 55 | Ht 65.0 in | Wt 162.2 lb

## 2020-07-22 DIAGNOSIS — I4819 Other persistent atrial fibrillation: Secondary | ICD-10-CM | POA: Insufficient documentation

## 2020-07-22 DIAGNOSIS — E039 Hypothyroidism, unspecified: Secondary | ICD-10-CM | POA: Insufficient documentation

## 2020-07-22 DIAGNOSIS — Z79899 Other long term (current) drug therapy: Secondary | ICD-10-CM | POA: Diagnosis not present

## 2020-07-22 DIAGNOSIS — I480 Paroxysmal atrial fibrillation: Secondary | ICD-10-CM

## 2020-07-22 DIAGNOSIS — G4733 Obstructive sleep apnea (adult) (pediatric): Secondary | ICD-10-CM | POA: Diagnosis not present

## 2020-07-22 DIAGNOSIS — D6869 Other thrombophilia: Secondary | ICD-10-CM

## 2020-07-22 DIAGNOSIS — E785 Hyperlipidemia, unspecified: Secondary | ICD-10-CM | POA: Diagnosis not present

## 2020-07-22 DIAGNOSIS — Z7901 Long term (current) use of anticoagulants: Secondary | ICD-10-CM | POA: Diagnosis not present

## 2020-07-22 DIAGNOSIS — I10 Essential (primary) hypertension: Secondary | ICD-10-CM | POA: Insufficient documentation

## 2020-07-22 MED ORDER — AMIODARONE HCL 200 MG PO TABS: 100.0000 mg | ORAL_TABLET | Freq: Every day | ORAL | 3 refills | Status: DC

## 2020-07-22 NOTE — Patient Instructions (Signed)
Decrease amiodarone to 1/2 tab daily (100 mg total)  [address] office([facility]) scheduler will be in touch  re appointment with electrophysiologist to discuss ablation

## 2020-07-22 NOTE — Progress Notes (Signed)
Primary Care Physician: Shon Baton, MD Referring Physician: [facility] ER f/u Cardiologist-  [name]  EP- [name] is a 80 y.o. female with a h/o HLD, hypothyroidism  that was successfully cardioverted in the [facility] ER 10/26/17 for new onset Afib. She returned to  the afib office 8/2 but had gone back into  afib and was unaware. She was started on BB for better rate control.   She denied tobacco, alcohol, excessive caffeine. Had been told she snored and appeared to stop breathing at night. Sleep study done 8/30 showed OSA. CPAP started. She had an echo that showed EF at 35% with diffuse hypokinesis. Lexi myoview did not show any ischemia but was an intermediate test.  She was already on an ARB/BB.   She remained in afib, rate controlled. She denied any exertional chest pain, is fatigued and has noted shortness of breath more so with steps. She was admitted for Tikosyn.  However after discharge qt prolonged and Tikosyn was stopped.   Since then she has had a long period of time without any afib until this past Monday. No  specific trigger but afib has been persistent since then. She feels some nausea and fatigue while in afib. Afib in the 16 's today on EKG. Echo in 2020 showed normalization of EF.   F/u afib clinic 11/14/19, following successful cardioversion. ekg today shows  Sinus brady . She feels well, improved in SR. She has had her covid shots a while back.   F/u in afib clinic, 01/02/20. She called to the office last week c/o left leg swelling since August. Rt leg no swelling. Reduced her amlodipine for a few days with mild improvement but still swelling. I scheduled her for an Venous U/S this am which was negative for DVT. Minimal swelling this am. Usually  gets more swelling  during the day and down by the next am. She does wear intermittent support socks. Tries to minimize salt. No pain  associated with the swelling. 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 The U/S   technician mentioned to her that she could see evidence of  incompetent veins.   BP elevated on presentation this am, usually well controlled. On recheck improved to 150/80. She will check again when she gets home.  F/u in afib clinic, 12/15,  for return of afib. Would like  to be cardioverted. No missed anticoagulation. She is in afib in the 90's.  F/u afib clinic, 03/31/20, failed cardioversion, despite multiple shocks. We discussed antiarrythmic's. She has failed tikoyn in the past. She has h/o HF and IVCD so flecainide/Multaq is not an issue. So dicussed amiodarone with pt as it appears to be her only option. I discussed this could be short term bridge  to ablation in the near future as she appears to be a good candidate. But she runs fast in afib so feel she will be better served to get her back in rhythm short term with amiodarone. She is in agreement.   F/u in afib clinic, 04/30/20. She has now been loading on amiodarone x one month so will proceed with cardioversion to see if SR can be restored. Pt is in agreement. No missed doses of anticoagulation.   F/u in afib clinic, 05/18/20. She did have successful cardioversion 05/11/20 and ekg today shows SR.   F/u in afib clinic, 07/22/20. She has been staying in SR with amiodarone but reports that her PCP is concerned as  her TSH is staying elevated. We discussed reducing amiodarone  to 100 mg daily and referring for an ablation to see if amiodarone can be d/c after that.   Today, she denies symptoms of palpitations, chest pain, shortness of breath, orthopnea, PND, lower extremity edema, dizziness, presyncope, syncope, or neurologic sequela. The patient is tolerating medications without difficulties and is otherwise without complaint today.   Past Medical History:  Diagnosis Date  . Allergy    seasonal  . Anemia   . Arthritis   . Cataract    bilateral removed  . Celiac disease   . Eczema   . H/O transfusion of platelets   . Hyperlipidemia   .  Hypothyroidism   . OSA (obstructive sleep apnea)   . Osteopenia   . Paroxysmal atrial fibrillation (HCC)   . Shingles   . Vitamin D deficiency    Past Surgical History:  Procedure Laterality Date  . CARDIOVERSION N/A 12/14/2017   Procedure: CARDIOVERSION;  Surgeon: Sanda Klein, MD;  Location: [facility] ENDOSCOPY;  Service: Cardiovascular;  Laterality: N/A;  . CARDIOVERSION N/A 11/07/2019   Procedure: CARDIOVERSION;  Surgeon: Jerline Pain, MD;  Location: [facility] ENDOSCOPY;  Service: Cardiovascular;  Laterality: N/A;  . CARDIOVERSION N/A 03/24/2020   Procedure: CARDIOVERSION;  Surgeon: [name], MD;  Location: [facility] ENDOSCOPY;  Service: Cardiovascular;  Laterality: N/A;  . CARDIOVERSION N/A 05/11/2020   Procedure: CARDIOVERSION;  Surgeon: Fay Records, MD;  Location: [facility];  Service: Cardiovascular;  Laterality: N/A;  . CATARACT EXTRACTION     bilateral  . COLONOSCOPY    . DILATION AND CURETTAGE OF UTERUS    . UPPER GI ENDOSCOPY      Current Outpatient Medications  Medication Sig Dispense Refill  . acetaminophen (TYLENOL) 500 MG tablet Take 1,000 mg by mouth 2 (two) times daily as needed for moderate pain.    Marland Kitchen alendronate (FOSAMAX) 70 MG tablet Take 70 mg by mouth every Tuesday.     Marland Kitchen amLODipine (NORVASC) 5 MG tablet Take 1 tablet (5 mg total) by mouth daily. 90 tablet 2  . apixaban (ELIQUIS) 5 MG TABS tablet Take 1 tablet (5 mg total) by mouth 2 (two) times daily. 180 tablet 1  . Ascorbic Acid (VITAMIN C) 500 MG CAPS Take 500 mg by mouth daily.    . brimonidine (ALPHAGAN) 0.15 % ophthalmic solution Place 1 drop into the left eye 2 (two) times daily.    . Calcium Carbonate-Vitamin D (CALCIUM 600+D PO) Take 1 tablet by mouth daily.    . cholecalciferol (VITAMIN D) 400 units TABS tablet Take 400 Units by mouth daily.    . dorzolamide-timolol (COSOPT) 22.3-6.8 MG/ML ophthalmic solution Place 1 drop into the left eye 2 (two) times daily.    . fluorometholone (FML) 0.1 % ophthalmic  suspension Place 1 drop into the left eye 2 (two) times daily.    Marland Kitchen gabapentin (NEURONTIN) 100 MG capsule Take 100 mg by mouth at bedtime.    . hydrocortisone (ANUSOL-HC) 2.5 % rectal cream Apply topically 2 (two) times daily.    Marland Kitchen levothyroxine (SYNTHROID) 88 MCG tablet Take 88 mcg by mouth daily.    Marland Kitchen loratadine (CLARITIN) 10 MG tablet Take 10 mg by mouth every evening.     Marland Kitchen losartan (COZAAR) 50 MG tablet Take 1 tablet (50 mg total) by mouth daily. 90 tablet 3  . Menthol-Methyl Salicylate (SALONPAS PAIN RELIEF PATCH EX) Apply 1 patch topically daily as needed (pain).    . methocarbamol (ROBAXIN)  500 MG tablet Take 500 mg by mouth daily as needed for muscle spasms.    . metoprolol succinate (TOPROL-XL) 50 MG 24 hr tablet TAKE 1 TABLET BY MOUTH DAILY. TAKE WITH OR IMMEDIATELY FOLLOWING A MEAL. 90 tablet 2  . Multiple Vitamin (MULTIVITAMIN WITH MINERALS) TABS tablet Take 1 tablet by mouth daily.    . Multiple Vitamins-Minerals (HAIR SKIN AND NAILS FORMULA) TABS Take 1 tablet by mouth daily in the afternoon.    . Multiple Vitamins-Minerals (OCUVITE EYE HEATLH GUMMIES PO) Take 1 capsule by mouth daily.    Marland Kitchen omeprazole (PRILOSEC) 20 MG capsule Take 20 mg by mouth at bedtime.    Marland Kitchen oxybutynin (DITROPAN) 5 MG tablet Take 2.5 mg every evening by mouth.    Marland Kitchen PATADAY 0.2 % SOLN Place 1 drop into both eyes daily as needed (allergies).     . rosuvastatin (CRESTOR) 5 MG tablet Take 5 mg by mouth every evening.   3  . SYSTANE ULTRA 0.4-0.3 % SOLN Place 1 drop into both eyes daily as needed (dry eyes).     . traMADol (ULTRAM) 50 MG tablet Take 50 mg by mouth 2 (two) times daily as needed for moderate pain.    Marland Kitchen amiodarone (PACERONE) 200 MG tablet Take 0.5 tablets (100 mg total) by mouth daily. 60 tablet 3   No current facility-administered medications for this encounter.    Allergies  Allergen Reactions  . Betadine [Povidone Iodine] Itching    With prolonged usage   . Tommas Olp Antonieta Pert Polysaccharide]      Severe constipation     Social History   Socioeconomic History  . Marital status: Widowed    Spouse name: Not on file  . Number of children: 4  . Years of education: Not on file  . Highest education level: Not on file  Occupational History  . Occupation: retired    Fish farm manager: RETIRED  Tobacco Use  . Smoking status: Never Smoker  . Smokeless tobacco: Never Used  Vaping Use  . Vaping Use: Never used  Substance and Sexual Activity  . Alcohol use: No    Alcohol/week: 0.0 standard drinks  . Drug use: No  . Sexual activity: Not on file  Other Topics Concern  . Not on file  Social History Narrative   Daily caffeine   Social Determinants of Health   Financial Resource Strain: Not on file  Food Insecurity: Not on file  Transportation Needs: Not on file  Physical Activity: Not on file  Stress: Not on file  Social Connections: Not on file  Intimate Partner Violence: Not on file    Family History  Problem Relation Age of Onset  . Liver cancer Mother   . Diabetes Father   . Heart disease Father   . Colon cancer Neg Hx     ROS- All systems are reviewed and negative except as per the HPI above  Physical Exam: Vitals:   07/22/20 1418  BP: (!) 124/50  Pulse: (!) 55  Weight: 73.6 kg  Height: 5' 5"  (1.651 m)   Wt Readings from Last 3 Encounters:  07/22/20 73.6 kg  05/18/20 76.7 kg  05/11/20 74.1 kg    Labs: Lab Results  Component Value Date   NA 132 (L) 04/30/2020   K 4.4 04/30/2020   CL 98 04/30/2020   CO2 23 04/30/2020   GLUCOSE 104 (H) 04/30/2020   BUN 17 04/30/2020   CREATININE 0.81 04/30/2020   CALCIUM 9.1 04/30/2020   MG 2.0 12/25/2017  No results found for: INR No results found for: CHOL, HDL, LDLCALC, TRIG   GEN- The patient is well appearing, alert and oriented x 3 today.   Head- normocephalic, atraumatic Eyes-  Sclera clear, conjunctiva pink Ears- hearing intact Oropharynx- clear Neck- supple, no JVP Lymph- no cervical  lymphadenopathy Lungs- Clear to ausculation bilaterally, normal work of breathing Heart- regular rate and rhythm, no murmurs, rubs or gallops, PMI not laterally displaced GI- soft, NT, ND, + BS Extremities- no clubbing, cyanosis, or edema, + for varicose veins  MS- no significant deformity or atrophy Skin- no rash or lesion Psych- euthymic mood, full affect Neuro- strength and sensation are intact  EKG-Sinus brady at 55 bpm with first degree AV block, pr int 224 ms, qrs int 150 ms, qtc 445 ms Epic records reviewed  Echo- 7/22/201. The left ventricle has normal systolic function with an ejection  fraction of 60-65%. The cavity size was normal. There is mild concentric  left ventricular hypertrophy. Left ventricular diastolic Doppler  parameters are consistent with  pseudonormalization. Elevated mean left atrial pressure.  2. The right ventricle has normal systolic function. The cavity was  normal. There is no increase in right ventricular wall thickness. Right  ventricular systolic pressure is normal with an estimated pressure of 36.9  mmHg.  3. Left atrial size was mildly dilated.  4. There is mild to moderate mitral annular calcification present.  5. The aorta is normal in size and structure.     Assessment and Plan: 1. Persistent  afib Cardioversion in August and in [place] with last cardioversion unsuccessful  We discussed antiarrythmic's as pt is symptomatic with afib, and has RVR I think amiodarone is her only option  Failed tikosyn in the past  She was loaded  on  200 mg amiodarone bid, for last month and then  proceeded with successful  Cardioversion Reduced amiodarone to 200 mg daily She remains in SR today but is having issues with elevation of TSH ( will have tsh levels faxed form PCP)  We had previously discussed amio as a bridge to ablation and then could be stopped I will lower dose of amiodarone  to 100 mg daily and refer to discuss ablation   Continue   metoprolol succinate 50 mg daily    2. Chadsvasc score of  3 Continue eliquis 5 mg bid   3. HTN Stable   F/u as needed in afib clinic after seeing EP   Butch Penny C. Jet Traynham, [facility] [address] [place], [place] [address] [phone]

## 2020-08-11 ENCOUNTER — Other Ambulatory Visit: Payer: Self-pay

## 2020-08-11 ENCOUNTER — Encounter: Payer: Self-pay | Admitting: Cardiology

## 2020-08-11 ENCOUNTER — Ambulatory Visit (INDEPENDENT_AMBULATORY_CARE_PROVIDER_SITE_OTHER): Payer: Medicare Other | Admitting: Cardiology

## 2020-08-11 VITALS — BP 158/58 | HR 54 | Ht 66.5 in | Wt 161.0 lb

## 2020-08-11 DIAGNOSIS — Z01812 Encounter for preprocedural laboratory examination: Secondary | ICD-10-CM

## 2020-08-11 DIAGNOSIS — Z01818 Encounter for other preprocedural examination: Secondary | ICD-10-CM

## 2020-08-11 DIAGNOSIS — I4819 Other persistent atrial fibrillation: Secondary | ICD-10-CM | POA: Diagnosis not present

## 2020-08-11 NOTE — Progress Notes (Signed)
Electrophysiology Office Note   Date:  08/11/2020   ID:  Susan Bell, DOB March 23, 1941, MRN [id number]  PCP:  Shon Baton, MD  Cardiologist:  [facility] Primary Electrophysiologist: Gaye Alken, MD    Chief Complaint: AF   History of Present Illness: Susan Bell is a 80 y.o. female who is being seen today for the evaluation of AF at the request of Sherran Needs, NP. Presenting today for electrophysiology evaluation.  She has a history significant for hyperlipidemia, hypothyroidism, and atrial fibrillation.  She initially presented to the emergency room 10/26/2017.  She was cardioverted in the emergency room.  She went back into atrial fibrillation 11/03/2017 but was unaware.  She was started on beta-blockers for rate control.  She had a sleep study which showed sleep apnea.  She also had an echo that showed an ejection fraction of 35%.  She remained in rate controlled atrial fibrillation.  She noted shortness of breath.  She was admitted for dofetilide, but her QT was prolonged and she was started on amiodarone.  Unfortunately her TSH has become elevated.  Today, she denies symptoms of palpitations, chest pain, shortness of breath, orthopnea, PND, lower extremity edema, claudication, dizziness, presyncope, syncope, bleeding, or neurologic sequela. The patient is tolerating medications without difficulties.    Past Medical History:  Diagnosis Date  . Allergy    seasonal  . Anemia   . Arthritis   . Cataract    bilateral removed  . Celiac disease   . Eczema   . H/O transfusion of platelets   . Hyperlipidemia   . Hypothyroidism   . OSA (obstructive sleep apnea)   . Osteopenia   . Paroxysmal atrial fibrillation (HCC)   . Shingles   . Vitamin D deficiency    Past Surgical History:  Procedure Laterality Date  . CARDIOVERSION N/A 12/14/2017   Procedure: CARDIOVERSION;  Surgeon: Sanda Klein, MD;  Location: [facility] ENDOSCOPY;  Service: Cardiovascular;   Laterality: N/A;  . CARDIOVERSION N/A 11/07/2019   Procedure: CARDIOVERSION;  Surgeon: Jerline Pain, MD;  Location: [facility] ENDOSCOPY;  Service: Cardiovascular;  Laterality: N/A;  . CARDIOVERSION N/A 03/24/2020   Procedure: CARDIOVERSION;  Surgeon: [name], MD;  Location: [facility] ENDOSCOPY;  Service: Cardiovascular;  Laterality: N/A;  . CARDIOVERSION N/A 05/11/2020   Procedure: CARDIOVERSION;  Surgeon: Fay Records, MD;  Location: [place];  Service: Cardiovascular;  Laterality: N/A;  . CATARACT EXTRACTION     bilateral  . COLONOSCOPY    . DILATION AND CURETTAGE OF UTERUS    . UPPER GI ENDOSCOPY       Current Outpatient Medications  Medication Sig Dispense Refill  . acetaminophen (TYLENOL) 500 MG tablet Take 1,000 mg by mouth 2 (two) times daily as needed for moderate pain.    Marland Kitchen alendronate (FOSAMAX) 70 MG tablet Take 70 mg by mouth every Tuesday.     Marland Kitchen amiodarone (PACERONE) 200 MG tablet Take 0.5 tablets (100 mg total) by mouth daily. 60 tablet 3  . amLODipine (NORVASC) 5 MG tablet Take 1 tablet (5 mg total) by mouth daily. 90 tablet 2  . apixaban (ELIQUIS) 5 MG TABS tablet Take 1 tablet (5 mg total) by mouth 2 (two) times daily. 180 tablet 1  . Ascorbic Acid (VITAMIN C) 500 MG CAPS Take 500 mg by mouth daily.    . brimonidine (ALPHAGAN) 0.15 % ophthalmic solution Place 1 drop into the left eye 2 (two) times daily.    . Calcium Carbonate-Vitamin  D (CALCIUM 600+D PO) Take 1 tablet by mouth daily.    . cholecalciferol (VITAMIN D) 400 units TABS tablet Take 400 Units by mouth daily.    . dorzolamide-timolol (COSOPT) 22.3-6.8 MG/ML ophthalmic solution Place 1 drop into the left eye 2 (two) times daily.    . fluorometholone (FML) 0.1 % ophthalmic suspension Place 1 drop into the left eye 2 (two) times daily.    Marland Kitchen gabapentin (NEURONTIN) 100 MG capsule Take 100 mg by mouth at bedtime.    . hydrocortisone (ANUSOL-HC) 2.5 % rectal cream Apply topically 2 (two) times daily.    Marland Kitchen  levothyroxine (SYNTHROID) 88 MCG tablet Take 88 mcg by mouth daily.    Marland Kitchen loratadine (CLARITIN) 10 MG tablet Take 10 mg by mouth every evening.     Marland Kitchen losartan (COZAAR) 50 MG tablet Take 1 tablet (50 mg total) by mouth daily. 90 tablet 3  . Menthol-Methyl Salicylate (SALONPAS PAIN RELIEF PATCH EX) Apply 1 patch topically daily as needed (pain).    . methocarbamol (ROBAXIN) 500 MG tablet Take 500 mg by mouth daily as needed for muscle spasms.    . metoprolol succinate (TOPROL-XL) 50 MG 24 hr tablet Take 25 mg by mouth daily.    . Multiple Vitamin (MULTIVITAMIN WITH MINERALS) TABS tablet Take 1 tablet by mouth daily.    . Multiple Vitamins-Minerals (HAIR SKIN AND NAILS FORMULA) TABS Take 1 tablet by mouth daily in the afternoon.    . Multiple Vitamins-Minerals (OCUVITE EYE HEATLH GUMMIES PO) Take 1 capsule by mouth daily.    Marland Kitchen omeprazole (PRILOSEC) 20 MG capsule Take 20 mg by mouth at bedtime.    Marland Kitchen oxybutynin (DITROPAN) 5 MG tablet Take 2.5 mg every evening by mouth.    Marland Kitchen PATADAY 0.2 % SOLN Place 1 drop into both eyes daily as needed (allergies).     . rosuvastatin (CRESTOR) 5 MG tablet Take 5 mg by mouth every evening.   3  . SYSTANE ULTRA 0.4-0.3 % SOLN Place 1 drop into both eyes daily as needed (dry eyes).     . traMADol (ULTRAM) 50 MG tablet Take 50 mg by mouth 2 (two) times daily as needed for moderate pain.     No current facility-administered medications for this visit.    Allergies:   Betadine [povidone iodine] and Ferrex [iron polysaccharide]   Social History:  The patient  reports that she has never smoked. She has never used smokeless tobacco. She reports that she does not drink alcohol and does not use drugs.   Family History:  The patient's family history includes Diabetes in her father; Heart disease in her father; Liver cancer in her mother.    ROS:  Please see the history of present illness.   Otherwise, review of systems is positive for none.   All other systems are reviewed  and negative.    PHYSICAL EXAM: VS:  BP (!) 158/58   Pulse (!) 54   Ht 5' 6.5" (1.689 m)   Wt 161 lb (73 kg)   SpO2 97%   BMI 25.60 kg/m  , BMI Body mass index is 25.6 kg/m. GEN: Well nourished, well developed, in no acute distress  HEENT: normal  Neck: no JVD, carotid bruits, or masses Cardiac: RRR; no murmurs, rubs, or gallops,no edema  Respiratory:  clear to auscultation bilaterally, normal work of breathing GI: soft, nontender, nondistended, + BS MS: no deformity or atrophy  Skin: warm and dry Neuro:  Strength and sensation are intact Psych:  euthymic mood, full affect  EKG:  EKG is not ordered today. Personal review of the ekg ordered 07/22/20 shows sinus rhythm, rate 55, left bundle branch block  Recent Labs: 04/30/2020: BUN 17; Creatinine, Ser 0.81; Hemoglobin 12.0; Platelets 351; Potassium 4.4; Sodium 132 05/18/2020: ALT 23; TSH 12.729    Lipid Panel  No results found for: CHOL, TRIG, HDL, CHOLHDL, VLDL, LDLCALC, LDLDIRECT   Wt Readings from Last 3 Encounters:  08/11/20 161 lb (73 kg)  07/22/20 162 lb 3.2 oz (73.6 kg)  05/18/20 169 lb (76.7 kg)      Other studies Reviewed: Additional studies/ records that were reviewed today include: TTE 10/24/18  Review of the above records today demonstrates:  1. The left ventricle has normal systolic function with an ejection  fraction of 60-65%. The cavity size was normal. There is mild concentric  left ventricular hypertrophy. Left ventricular diastolic Doppler  parameters are consistent with  pseudonormalization. Elevated mean left atrial pressure.  2. The right ventricle has normal systolic function. The cavity was  normal. There is no increase in right ventricular wall thickness. Right  ventricular systolic pressure is normal with an estimated pressure of 36.9  mmHg.  3. Left atrial size was mildly dilated.  4. There is mild to moderate mitral annular calcification present.  5. The aorta is normal in size and  structure.    ASSESSMENT AND PLAN:  1.  Persistent atrial fibrillation: Currently on Eliquis and amiodarone with a CHA2DS2-VASc of 3.  High risk medication monitoring.  Cardioversion August and December 2021.  Currently on amiodarone.  Has failed Tikosyn.  Unfortunately her TSH has gone up  At this point, she would prefer to get off of amiodarone.  Risks and benefits of ablation were discussed she understands these risks and is agreed to the procedure.  Risk, benefits, and alternatives to EP study and radiofrequency ablation for afib were also discussed in detail today. These risks include but are not limited to stroke, bleeding, vascular damage, tamponade, perforation, damage to the esophagus, lungs, and other structures, pulmonary vein stenosis, worsening renal function, and death. The patient understands these risk and wishes to proceed.  We Maranatha Grossi therefore proceed with catheter ablation at the next available time.  Carto, ICE, anesthesia are requested for the procedure.    2. Hypertension: Mildly elevated today.  Has been well controlled in the past.  No changes.  Case discussed with primary cardiology  Current medicines are reviewed at length with the patient today.   The patient does not have concerns regarding her medicines.  The following changes were made today:  none  Labs/ tests ordered today include:  Orders Placed This Encounter  Procedures  . Basic metabolic panel  . CBC     Disposition:   FU with Leiby Pigeon 3 months  Signed, Yazhini Mcaulay Meredith Leeds, MD  08/11/2020 12:04 PM     [facility] [address] [place] [address] [phone] (office) [phone] (fax)

## 2020-08-11 NOTE — Patient Instructions (Signed)
Medication Instructions:  Your physician recommends that you continue on your current medications as directed. Please refer to the Current Medication list given to you today.  *If you need a refill on your cardiac medications before your next appointment, please call your pharmacy*   Lab Work: Pre procedure labs 09/14/20:  BMP & CBC If you have labs (blood work) drawn today and your tests are completely normal, you will receive your results only by: Marland Kitchen MyChart Message (if you have MyChart) OR . A paper copy in the mail If you have any lab test that is abnormal or we need to change your treatment, we will call you to review the results.   Testing/Procedures: Your physician has requested that you have a TEE the day before/morning of ablation. During a TEE, sound waves are used to create images of your heart. It provides your doctor with information about the size and shape of your heart and how well your heart's chambers and valves are working. In this test, a transducer is attached to the end of a flexible tube that's guided down your throat and into your esophagus (the tube leading from you mouth to your stomach) to get a more detailed image of your heart. You are not awake for the procedure. Please see the instruction sheet below located under "other instructions"    Your physician has recommended that you have an ablation. Catheter ablation is a medical procedure used to treat some cardiac arrhythmias (irregular heartbeats). During catheter ablation, a long, thin, flexible tube is put into a blood vessel in your groin (upper thigh), or neck. This tube is called an ablation catheter. It is then guided to your heart through the blood vessel. Radio frequency waves destroy small areas of heart tissue where abnormal heartbeats may cause an arrhythmia to start. Please follow instruction below located under "other instructions".   Follow-Up: At [facility], you and your health needs are our  priority.  As part of our continuing mission to provide you with exceptional heart care, we have created designated Provider Care Teams.  These Care Teams include your primary Cardiologist (physician) and Advanced Practice Providers (APPs -  Physician Assistants and Nurse Practitioners) who all work together to provide you with the care you need, when you need it.  Your next appointment:   1 month(s) after your ablation  The format for your next appointment:   In Person  Provider:   AFib clinic   Thank you for choosing [facility]!!   Trinidad Curet, RN [phone]    Other Instructions  Any Other Special Instructions Will Be Listed Below (If Applicable).  TEE INSTRUCTIONS  You are scheduled for a TEE  on 09/16/2020 with Dr. Oval Linsey.  Please arrive at the [facility], Entrance "A" of [facility] at 7:30  AM the day of your procedure. (the address is: [address])  1.) Diet:  A.) Nothing to eat or drink after midnight except your medications with a sip of water. (see medication instructions below)   2.) Labs: 09/14/20  3.)   Medication Instructions: Hold all medications morning of this procedure. Continue your anticoagulant: Eliquis You will need to continue your anticoagulant after your procedure until you are told by your provider that it is safe to stop  4.) You must have a responsible person to drive you home and stay in the waiting area during your procedure. Failure to do so could result in cancellation.   5.) Bring your current  insurance cards and current list of all your medications.  *Special Note:  Every effort is made to have your procedure done on time.  Occasionally there are emergencies that present themselves at the hospital that may cause delays.  Please be patient if a delay does occur.  *If you have any questions after you get home, please call the office at [phone].    Transesophageal Echocardiogram Transesophageal  echocardiography (TEE) is a special type of test that produces images of the heart by using sound waves (echocardiogram). This type of echocardiography can obtain better images of the heart than standard echocardiography. TEE is done by passing a flexible tube down the esophagus. The heart is located in front of the esophagus. Because the heart and esophagus are close to one another, your health care provider can take very clear, detailed pictures of the heart via ultrasound waves. TEE may be done:  If your health care provider needs more information based on standard echocardiography findings.  If you had a stroke. This might have happened because a clot formed in your heart. TEE can visualize different areas of the heart and check for clots.  To check valve anatomy and function.  To check for infection on the inside of your heart (endocarditis).  To evaluate the dividing wall (septum) of the heart and presence of a hole that did not close after birth (patent foramen ovale or atrial septal defect).  To help diagnose a tear in the wall of the aorta (aortic dissection).  During cardiac valve surgery. This allows the surgeon to assess the valve repair before closing the chest.  During a variety of other cardiac procedures to guide positioning of catheters.  Sometimes before a cardioversion, which is a shock to convert heart rhythm back to normal.  Tell a health care provider about:  Any allergies you have.  All medicines you are taking, including vitamins, herbs, eye drops, creams, and over-the-counter medicines.  Any problems you or family members have had with anesthetic medicines.  Any blood disorders you have.  Any surgeries you have had.  Any medical conditions you have.  Swallowing difficulties.  An esophageal obstruction. What are the risks? Generally, TEE is a safe procedure. However, as with any procedure, complications can occur. Possible complications include an  esophageal tear (rupture). What happens before the procedure?  Do not eat or drink for 6 hours before the procedure or as directed by your health care provider.  Arrange for someone to drive you home after the procedure. Do not drive yourself home. During the procedure, you will be given medicines that can continue to make you feel drowsy and can impair your reflexes.  An IV access tube will be started in the arm. What happens during the procedure?  A medicine to help you relax (sedative) will be given through the IV access tube.  A medicine may be sprayed or gargled to numb the back of the throat.  Your blood pressure, heart rate, and breathing (vital signs) will be monitored during the procedure.  The TEE probe is a long, flexible tube. The tip of the probe is placed into the back of the mouth, and you will be asked to swallow. This helps to pass the tip of the probe into the esophagus. Once the tip of the probe is in the correct area, your health care provider can take pictures of the heart.  TEE is usually not a painful procedure. You may feel the probe press against the back  of the throat. The probe does not enter the trachea and does not affect your breathing. What happens after the procedure?  You will be in bed, resting, until you have fully returned to consciousness.  When you first awaken, your throat may feel slightly sore and will probably still feel numb. This will improve slowly over time.  You will not be allowed to eat or drink until it is clear that the numbness has improved.  Once you have been able to drink, urinate, and sit on the edge of the bed without feeling sick to your stomach (nausea) or dizzy, you may be cleared to go home.  You should have a friend or family member with you for the next 24 hours after your procedure. This information is not intended to replace advice given to you by your health care provider. Make sure you discuss any questions you have with  your health care provider. Document Released: 06/11/2002 Document Revised: 08/27/2015 Document Reviewed: 09/20/2012 Elsevier Interactive Patient Education  2018 [employer].        Electrophysiology/Ablation Procedure Instructions   You are scheduled for a(n)  ablation on 09/16/20 with Dr. Allegra Lai.   1.   Pre procedure testing-             A.  LAB WORK --- On 09/14/2020 (before your nurse visit)  for your pre procedure blood work.  You do NOT need to be fasting.    B. Nurse visit EKG 09/14/2020 @ 11:00 am               C. COVID TEST-- On 09/14/2020 @ 12:00 (after your labs/nurse visit) -  This is a Drive Up Visit at [address] [place],  [address].  Someone will direct you to the appropriate testing line. Stay in your car and someone will be with you shortly.   After you are tested please go home and self quarantine until the day of your procedure.     2. On the day of your procedure 09/16/2020 you will go to [facility] [phone]) at 9:30 am (you will not leave after your TEE, but this is the time to arrive if TEE is cancelled).  You will go to the main entrance A [employer]) and enter where the [employer] parking staff are.  Your driver will drop you off and you will head down the hallway to ADMITTING.  You may have one support person come in to the hospital with you.  They will be asked to wait in the waiting room. It is OK to have someone drop you off and come back when you are ready to be discharged.   3.   Do not eat or drink after midnight prior to your procedure.   4.   On the morning of your procedure do NOT take any medication. Do not miss any doses of your blood thinner prior to the morning of your procedure or your procedure will need to be rescheduled.   5.  Plan for an overnight stay but you may be discharged after your procedure, if you use your phone frequently bring your phone charger. If you are discharged after your procedure you will need  someone to drive you home and be with you for 24 hours after your procedure.   6. You will follow up with the AFIB clinic 4 weeks after your procedure.  You will follow up with Dr. Curt Bears  3 months after your procedure.  These appointments will be made for you.   * If you have ANY questions please call the office [phone] and ask for Derreon Consalvo RN or send me a MyChart message   * Occasionally, EP Studies and ablations can become lengthy.  Please make your family aware of this before your procedure starts.  Average time ranges from 2-8 hours for EP studies/ablations.  Your physician will call your family after the procedure with the results.                                     Cardiac Ablation Cardiac ablation is a procedure to destroy (ablate) some heart tissue that is sending bad signals. These bad signals cause problems in heart rhythm. The heart has many areas that make these signals. If there are problems in these areas, they can make the heart beat in a way that is not normal. Destroying some tissues can help make the heart rhythm normal. Tell your doctor about:  Any allergies you have.  All medicines you are taking. These include vitamins, herbs, eye drops, creams, and over-the-counter medicines.  Any problems you or family members have had with medicines that make you fall asleep (anesthetics).  Any blood disorders you have.  Any surgeries you have had.  Any medical conditions you have, such as kidney failure.  Whether you are pregnant or may be pregnant. What are the risks? This is a safe procedure. But problems may occur, including:  Infection.  Bruising and bleeding.  Bleeding into the chest.  Stroke or blood clots.  Damage to nearby areas of your body.  Allergies to medicines or dyes.  The need for a pacemaker if the normal system is damaged.  Failure of the procedure to treat the problem. What happens before the procedure? Medicines Ask your doctor  about:  Changing or stopping your normal medicines. This is important.  Taking aspirin and ibuprofen. Do not take these medicines unless your doctor tells you to take them.  Taking other medicines, vitamins, herbs, and supplements. General instructions  Follow instructions from your doctor about what you cannot eat or drink.  Plan to have someone take you home from the hospital or clinic.  If you will be going home right after the procedure, plan to have someone with you for 24 hours.  Ask your doctor what steps will be taken to prevent infection. What happens during the procedure?  An IV tube will be put into one of your veins.  You will be given a medicine to help you relax.  The skin on your neck or groin will be numbed.  A cut (incision) will be made in your neck or groin. A needle will be put through your cut and into a large vein.  A tube (catheter) will be put into the needle. The tube will be moved to your heart.  Dye may be put through the tube. This helps your doctor see your heart.  Small devices (electrodes) on the tube will send out signals.  A type of energy will be used to destroy some heart tissue.  The tube will be taken out.  Pressure will be held on your cut. This helps stop bleeding.  A bandage will be put over your cut. The exact procedure may vary among doctors and hospitals.   What happens after the procedure?  You will be watched until you leave the hospital or  clinic. This includes checking your heart rate, breathing rate, oxygen, and blood pressure.  Your cut will be watched for bleeding. You will need to lie still for a few hours.  Do not drive for 24 hours or as long as your doctor tells you. Summary  Cardiac ablation is a procedure to destroy some heart tissue. This is done to treat heart rhythm problems.  Tell your doctor about any medical conditions you may have. Tell him or her about all medicines you are taking to treat them.  This  is a safe procedure. But problems may occur. These include infection, bruising, bleeding, and damage to nearby areas of your body.  Follow what your doctor tells you about food and drink. You may also be told to change or stop some of your medicines.  After the procedure, do not drive for 24 hours or as long as your doctor tells you. This information is not intended to replace advice given to you by your health care provider. Make sure you discuss any questions you have with your health care provider. Document Revised: 02/21/2019 Document Reviewed: 02/21/2019 Elsevier Patient Education  2021 [employer].

## 2020-08-18 DIAGNOSIS — M1712 Unilateral primary osteoarthritis, left knee: Secondary | ICD-10-CM | POA: Diagnosis not present

## 2020-08-19 DIAGNOSIS — J069 Acute upper respiratory infection, unspecified: Secondary | ICD-10-CM | POA: Diagnosis not present

## 2020-08-19 DIAGNOSIS — R059 Cough, unspecified: Secondary | ICD-10-CM | POA: Diagnosis not present

## 2020-08-19 DIAGNOSIS — I480 Paroxysmal atrial fibrillation: Secondary | ICD-10-CM | POA: Diagnosis not present

## 2020-08-19 DIAGNOSIS — E039 Hypothyroidism, unspecified: Secondary | ICD-10-CM | POA: Diagnosis not present

## 2020-08-19 DIAGNOSIS — Z1152 Encounter for screening for COVID-19: Secondary | ICD-10-CM | POA: Diagnosis not present

## 2020-08-25 DIAGNOSIS — M170 Bilateral primary osteoarthritis of knee: Secondary | ICD-10-CM | POA: Diagnosis not present

## 2020-08-25 DIAGNOSIS — M13862 Other specified arthritis, left knee: Secondary | ICD-10-CM | POA: Diagnosis not present

## 2020-08-28 ENCOUNTER — Other Ambulatory Visit: Payer: Self-pay | Admitting: Cardiology

## 2020-08-28 ENCOUNTER — Other Ambulatory Visit (HOSPITAL_COMMUNITY): Payer: Self-pay | Admitting: Cardiology

## 2020-08-28 DIAGNOSIS — I4891 Unspecified atrial fibrillation: Secondary | ICD-10-CM

## 2020-09-01 DIAGNOSIS — M25511 Pain in right shoulder: Secondary | ICD-10-CM | POA: Diagnosis not present

## 2020-09-01 DIAGNOSIS — M13861 Other specified arthritis, right knee: Secondary | ICD-10-CM | POA: Diagnosis not present

## 2020-09-01 DIAGNOSIS — M25512 Pain in left shoulder: Secondary | ICD-10-CM | POA: Diagnosis not present

## 2020-09-01 DIAGNOSIS — M13862 Other specified arthritis, left knee: Secondary | ICD-10-CM | POA: Diagnosis not present

## 2020-09-02 ENCOUNTER — Telehealth: Payer: Self-pay | Admitting: *Deleted

## 2020-09-02 NOTE — Telephone Encounter (Signed)
Followed up with pt to clarify the changes r/t upcoming ablation. Pt aware MD prefers CT and is agreeable to this moving forward, aware TEE will be cancelled. Aware 6/13 RN visit EKG cancelled. Aware Covid screening cancelled, no longer required (guideline exceptions to this reviewed). Pt will stop by the office tomorrow for blood work. Pt so excited with the updated news and cancellation of some testing.   She is agreeable to plan.

## 2020-09-03 ENCOUNTER — Other Ambulatory Visit: Payer: Medicare Other | Admitting: *Deleted

## 2020-09-03 ENCOUNTER — Other Ambulatory Visit: Payer: Self-pay

## 2020-09-03 DIAGNOSIS — I4819 Other persistent atrial fibrillation: Secondary | ICD-10-CM | POA: Diagnosis not present

## 2020-09-03 DIAGNOSIS — Z01812 Encounter for preprocedural laboratory examination: Secondary | ICD-10-CM | POA: Diagnosis not present

## 2020-09-03 LAB — CBC
Hematocrit: 31.2 % — ABNORMAL LOW (ref 34.0–46.6)
Hemoglobin: 10.6 g/dL — ABNORMAL LOW (ref 11.1–15.9)
MCH: 29.8 pg (ref 26.6–33.0)
MCHC: 34 g/dL (ref 31.5–35.7)
MCV: 88 fL (ref 79–97)
Platelets: 385 10*3/uL (ref 150–450)
RBC: 3.56 x10E6/uL — ABNORMAL LOW (ref 3.77–5.28)
RDW: 14.7 % (ref 11.7–15.4)
WBC: 11 10*3/uL — ABNORMAL HIGH (ref 3.4–10.8)

## 2020-09-03 LAB — BASIC METABOLIC PANEL
BUN/Creatinine Ratio: 26 (ref 12–28)
BUN: 20 mg/dL (ref 8–27)
CO2: 19 mmol/L — ABNORMAL LOW (ref 20–29)
Calcium: 9.5 mg/dL (ref 8.7–10.3)
Chloride: 96 mmol/L (ref 96–106)
Creatinine, Ser: 0.78 mg/dL (ref 0.57–1.00)
Glucose: 135 mg/dL — ABNORMAL HIGH (ref 65–99)
Potassium: 4.3 mmol/L (ref 3.5–5.2)
Sodium: 131 mmol/L — ABNORMAL LOW (ref 134–144)
eGFR: 77 mL/min/{1.73_m2} (ref >59)

## 2020-09-04 ENCOUNTER — Other Ambulatory Visit: Payer: Medicare Other

## 2020-09-04 ENCOUNTER — Other Ambulatory Visit (HOSPITAL_COMMUNITY): Payer: Medicare Other

## 2020-09-11 ENCOUNTER — Telehealth (HOSPITAL_COMMUNITY): Payer: Self-pay | Admitting: Emergency Medicine

## 2020-09-11 NOTE — Telephone Encounter (Signed)
Pt returning phone call regarding upcoming cardiac imaging study; pt verbalizes understanding of appt date/time, parking situation and where to check in, pre-test NPO status and medications ordered, and verified current allergies; name and call back number provided for further questions should they arise Marchia Bond RN Navigator Cardiac Imaging [facility] [phone] office [phone] cell  Daily meds per usual

## 2020-09-11 NOTE — Telephone Encounter (Signed)
Attempted to call patient regarding upcoming cardiac CT appointment. °Left message on voicemail with name and callback number °Embry Huss RN Navigator Cardiac Imaging °[facility] °[phone] Office °[phone] Cell ° °

## 2020-09-14 ENCOUNTER — Other Ambulatory Visit: Payer: Medicare Other

## 2020-09-14 ENCOUNTER — Ambulatory Visit (HOSPITAL_COMMUNITY)
Admission: RE | Admit: 2020-09-14 | Discharge: 2020-09-14 | Disposition: A | Discharge status: Home / Self Care | Payer: Medicare Other | Source: Ambulatory Visit | Attending: Cardiology | Admitting: Cardiology

## 2020-09-14 ENCOUNTER — Other Ambulatory Visit: Payer: Self-pay

## 2020-09-14 ENCOUNTER — Other Ambulatory Visit (HOSPITAL_COMMUNITY): Payer: Medicare Other

## 2020-09-14 ENCOUNTER — Ambulatory Visit: Payer: Medicare Other

## 2020-09-14 DIAGNOSIS — I4891 Unspecified atrial fibrillation: Secondary | ICD-10-CM | POA: Insufficient documentation

## 2020-09-14 MED ORDER — IOHEXOL 350 MG/ML SOLN
100.0000 mL | Freq: Once | INTRAVENOUS | Status: AC | PRN
  Administered 2020-09-14: 100 mL via INTRAVENOUS

## 2020-09-15 ENCOUNTER — Telehealth: Payer: Self-pay | Admitting: Cardiology

## 2020-09-15 NOTE — Telephone Encounter (Signed)
Pt is calling in regards to her procedure tomorrow, pt wants to know if she needs to bring her CPAP and needs to know what she needs to wear. Please advise

## 2020-09-15 NOTE — Pre-Procedure Instructions (Signed)
Instructed patient on the following items: Arrival time 0930 Nothing to eat or drink after midnight No meds AM of procedure Responsible person to drive you home and stay with you for 24 hrs  Have you missed any doses of anti-coagulant Eliquis- hasn't missed any doses

## 2020-09-16 ENCOUNTER — Ambulatory Visit (HOSPITAL_COMMUNITY): Payer: Medicare Other | Admitting: Anesthesiology

## 2020-09-16 ENCOUNTER — Ambulatory Visit (HOSPITAL_COMMUNITY)
Admission: RE | Admit: 2020-09-16 | Discharge: 2020-09-16 | Disposition: A | Discharge status: Home / Self Care | Payer: Medicare Other | Attending: Cardiology | Admitting: Cardiology

## 2020-09-16 ENCOUNTER — Other Ambulatory Visit: Payer: Self-pay

## 2020-09-16 ENCOUNTER — Encounter (HOSPITAL_COMMUNITY)
Admission: RE | Disposition: A | Discharge status: Home / Self Care | Payer: Self-pay | Source: Home / Self Care | Attending: Cardiology

## 2020-09-16 ENCOUNTER — Encounter (HOSPITAL_COMMUNITY): Payer: Self-pay | Admitting: Cardiology

## 2020-09-16 DIAGNOSIS — I4819 Other persistent atrial fibrillation: Secondary | ICD-10-CM | POA: Insufficient documentation

## 2020-09-16 DIAGNOSIS — Z8619 Personal history of other infectious and parasitic diseases: Secondary | ICD-10-CM | POA: Diagnosis not present

## 2020-09-16 DIAGNOSIS — I110 Hypertensive heart disease with heart failure: Secondary | ICD-10-CM | POA: Diagnosis not present

## 2020-09-16 DIAGNOSIS — Z888 Allergy status to other drugs, medicaments and biological substances status: Secondary | ICD-10-CM | POA: Diagnosis not present

## 2020-09-16 DIAGNOSIS — Z8249 Family history of ischemic heart disease and other diseases of the circulatory system: Secondary | ICD-10-CM | POA: Insufficient documentation

## 2020-09-16 DIAGNOSIS — I5022 Chronic systolic (congestive) heart failure: Secondary | ICD-10-CM | POA: Diagnosis not present

## 2020-09-16 DIAGNOSIS — G4733 Obstructive sleep apnea (adult) (pediatric): Secondary | ICD-10-CM | POA: Diagnosis not present

## 2020-09-16 DIAGNOSIS — E039 Hypothyroidism, unspecified: Secondary | ICD-10-CM | POA: Diagnosis not present

## 2020-09-16 DIAGNOSIS — E785 Hyperlipidemia, unspecified: Secondary | ICD-10-CM | POA: Insufficient documentation

## 2020-09-16 DIAGNOSIS — E7800 Pure hypercholesterolemia, unspecified: Secondary | ICD-10-CM | POA: Diagnosis not present

## 2020-09-16 DIAGNOSIS — I480 Paroxysmal atrial fibrillation: Secondary | ICD-10-CM | POA: Diagnosis not present

## 2020-09-16 HISTORY — PX: ATRIAL FIBRILLATION ABLATION: EP1191

## 2020-09-16 SURGERY — ATRIAL FIBRILLATION ABLATION
Anesthesia: General

## 2020-09-16 SURGERY — ECHOCARDIOGRAM, TRANSESOPHAGEAL
Anesthesia: Monitor Anesthesia Care

## 2020-09-16 MED ORDER — LIDOCAINE 2% (20 MG/ML) 5 ML SYRINGE
INTRAMUSCULAR | Status: DC | PRN
  Administered 2020-09-16: 20 mg via INTRAVENOUS

## 2020-09-16 MED ORDER — SUGAMMADEX SODIUM 200 MG/2ML IV SOLN
INTRAVENOUS | Status: DC | PRN
  Administered 2020-09-16 (×2): 100 mg via INTRAVENOUS

## 2020-09-16 MED ORDER — DOBUTAMINE IN D5W 4-5 MG/ML-% IV SOLN
INTRAVENOUS | Status: DC | PRN
  Administered 2020-09-16: 20 ug/kg/min via INTRAVENOUS

## 2020-09-16 MED ORDER — ONDANSETRON HCL 4 MG/2ML IJ SOLN: 4.0000 mg | Freq: Four times a day (QID) | INTRAMUSCULAR | Status: DC | PRN

## 2020-09-16 MED ORDER — SODIUM CHLORIDE 0.9 % IV SOLN: INTRAVENOUS | Status: DC

## 2020-09-16 MED ORDER — HEPARIN SODIUM (PORCINE) 1000 UNIT/ML IJ SOLN
INTRAMUSCULAR | Status: AC
  Filled 2020-09-16: qty 1

## 2020-09-16 MED ORDER — HEPARIN SODIUM (PORCINE) 1000 UNIT/ML IJ SOLN
INTRAMUSCULAR | Status: DC | PRN
  Administered 2020-09-16: 1000 [IU] via INTRAVENOUS

## 2020-09-16 MED ORDER — DEXAMETHASONE SODIUM PHOSPHATE 10 MG/ML IJ SOLN
INTRAMUSCULAR | Status: DC | PRN
  Administered 2020-09-16: 8 mg via INTRAVENOUS

## 2020-09-16 MED ORDER — PHENYLEPHRINE HCL-NACL 10-0.9 MG/250ML-% IV SOLN
INTRAVENOUS | Status: DC | PRN
  Administered 2020-09-16: 25 ug/min via INTRAVENOUS

## 2020-09-16 MED ORDER — FENTANYL CITRATE (PF) 250 MCG/5ML IJ SOLN
INTRAMUSCULAR | Status: DC | PRN
  Administered 2020-09-16 (×2): 50 ug via INTRAVENOUS

## 2020-09-16 MED ORDER — HEPARIN SODIUM (PORCINE) 1000 UNIT/ML IJ SOLN
INTRAMUSCULAR | Status: DC | PRN
  Administered 2020-09-16: 14000 [IU] via INTRAVENOUS
  Administered 2020-09-16: 6000 [IU] via INTRAVENOUS

## 2020-09-16 MED ORDER — HEPARIN (PORCINE) IN NACL 1000-0.9 UT/500ML-% IV SOLN
INTRAVENOUS | Status: DC | PRN
  Administered 2020-09-16 (×5): 500 mL

## 2020-09-16 MED ORDER — ONDANSETRON HCL 4 MG/2ML IJ SOLN
INTRAMUSCULAR | Status: DC | PRN
  Administered 2020-09-16: 4 mg via INTRAVENOUS

## 2020-09-16 MED ORDER — PROPOFOL 10 MG/ML IV BOLUS
INTRAVENOUS | Status: DC | PRN
  Administered 2020-09-16: 130 mg via INTRAVENOUS

## 2020-09-16 MED ORDER — PROTAMINE SULFATE 10 MG/ML IV SOLN
INTRAVENOUS | Status: DC | PRN
  Administered 2020-09-16: 40 mg via INTRAVENOUS

## 2020-09-16 MED ORDER — ROCURONIUM BROMIDE 10 MG/ML (PF) SYRINGE
PREFILLED_SYRINGE | INTRAVENOUS | Status: DC | PRN
  Administered 2020-09-16: 70 mg via INTRAVENOUS

## 2020-09-16 SURGICAL SUPPLY — 21 items
BLANKET WARM UNDERBOD FULL ACC (MISCELLANEOUS) ×3 IMPLANT
CATH MAPPNG PENTARAY F 2-6-2MM (CATHETERS) IMPLANT
CATH S CIRCA THERM PROBE 10F (CATHETERS) ×2 IMPLANT
CATH SMTCH THERMOCOOL SF DF (CATHETERS) ×2 IMPLANT
CATH SOUNDSTAR ECO 8FR (CATHETERS) ×2 IMPLANT
CATH WEB BI DIR CSDF CRV REPRO (CATHETERS) ×2 IMPLANT
CLOSURE PERCLOSE PROSTYLE (VASCULAR PRODUCTS) ×8 IMPLANT
COVER SWIFTLINK CONNECTOR (BAG) ×3 IMPLANT
KIT VERSACROSS STEERABLE D1 (CATHETERS) ×2 IMPLANT
MAT PREVALON FULL STRYKER (MISCELLANEOUS) ×2 IMPLANT
PACK EP LATEX FREE (CUSTOM PROCEDURE TRAY) ×3
PACK EP LF (CUSTOM PROCEDURE TRAY) ×1 IMPLANT
PAD PRO RADIOLUCENT 2001M-C (PAD) ×3 IMPLANT
PATCH CARTO3 (PAD) ×2 IMPLANT
PENTARAY F 2-6-2MM (CATHETERS) ×3
SHEATH CARTO VIZIGO SM CVD (SHEATH) ×2 IMPLANT
SHEATH PINNACLE 7F 10CM (SHEATH) ×2 IMPLANT
SHEATH PINNACLE 8F 10CM (SHEATH) ×4 IMPLANT
SHEATH PINNACLE 9F 10CM (SHEATH) ×2 IMPLANT
SHEATH PROBE COVER 6X72 (BAG) ×2 IMPLANT
TUBING SMART ABLATE COOLFLOW (TUBING) ×2 IMPLANT

## 2020-09-16 NOTE — Progress Notes (Signed)
When pt ambulated prior to DC, pt had slight ooze from right groin. Manual pressure held for 15 minutes. Dr. Curt Bears notified and asked to watch pt for an additional 30 minutes.

## 2020-09-16 NOTE — Anesthesia Preprocedure Evaluation (Addendum)
Anesthesia Evaluation  Patient identified by MRN, date of birth, ID band Patient awake    Reviewed: Allergy & Precautions, NPO status , Patient's Chart, lab work & pertinent test results, reviewed documented beta blocker date and time   History of Anesthesia Complications Negative for: history of anesthetic complications  Airway Mallampati: II  TM Distance: >3 FB Neck ROM: Full    Dental  (+) Teeth Intact, Dental Advisory Given   Pulmonary sleep apnea ,    Pulmonary exam normal        Cardiovascular hypertension, Pt. on medications and Pt. on home beta blockers Normal cardiovascular exam+ dysrhythmias Atrial Fibrillation  Rhythm:Regular Rate:Normal     Neuro/Psych negative neurological ROS     GI/Hepatic Neg liver ROS, GERD  ,  Endo/Other  Hypothyroidism   Renal/GU negative Renal ROS  negative genitourinary   Musculoskeletal  (+) Arthritis ,   Abdominal   Peds  Hematology  (+) anemia ,   Anesthesia Other Findings  Cardiac CT 09/14/20: IMPRESSION: 1. There is normal pulmonary vein drainage into the left atrium with ostial measurements above. 2. There is no thrombus in the left atrial appendage. 3. The esophagus runs in the left atrial midline and is not in proximity to any of the pulmonary vein ostia. 4. No PFO/ASD. 5. Normal coronary origin. Right dominance. Possible moderate mid RCA stenosis as noted above. 6. CAC score of 424 Agatston units which is 77th percentile for age-, race-, and sex-matched controls.  Reproductive/Obstetrics                           Anesthesia Physical Anesthesia Plan  ASA: 3  Anesthesia Plan: General   Post-op Pain Management:    Induction: Intravenous  PONV Risk Score and Plan: 3 and Ondansetron, Dexamethasone, Treatment may vary due to age or medical condition and Midazolam  Airway Management Planned: Oral ETT  Additional Equipment:  None  Intra-op Plan:   Post-operative Plan: Extubation in OR  Informed Consent: I have reviewed the patients History and Physical, chart, labs and discussed the procedure including the risks, benefits and alternatives for the proposed anesthesia with the patient or authorized representative who has indicated his/her understanding and acceptance.     Dental advisory given  Plan Discussed with:   Anesthesia Plan Comments:         Anesthesia Quick Evaluation

## 2020-09-16 NOTE — Anesthesia Postprocedure Evaluation (Signed)
Anesthesia Post Note  Patient: Susan Bell  Procedure(s) Performed: ATRIAL FIBRILLATION ABLATION     Patient location during evaluation: PACU Anesthesia Type: General Level of consciousness: awake Pain management: pain level controlled Vital Signs Assessment: post-procedure vital signs reviewed and stable Respiratory status: spontaneous breathing Cardiovascular status: stable Postop Assessment: no apparent nausea or vomiting Anesthetic complications: no   No notable events documented.  Last Vitals:  Vitals:   09/16/20 1420 09/16/20 1425  BP: (!) 151/43 (!) 152/46  Pulse: 60 (!) 58  Resp: 19 14  Temp:    SpO2: 99% 99%    Last Pain:  Vitals:   09/16/20 1415  TempSrc: Temporal  PainSc: 0-No pain                 Danene Montijo

## 2020-09-16 NOTE — Transfer of Care (Signed)
Immediate Anesthesia Transfer of Care Note  Patient: Shanikqua A Toback  Procedure(s) Performed: ATRIAL FIBRILLATION ABLATION  Patient Location: Cath Lab  Anesthesia Type:General  Level of Consciousness: awake, alert  and oriented  Airway & Oxygen Therapy: Patient Spontanous Breathing  Post-op Assessment: Report given to RN, Post -op Vital signs reviewed and stable and Patient moving all extremities  Post vital signs: Reviewed and stable  Last Vitals:  Vitals Value Taken Time  BP 150/42 09/16/20 1416  Temp    Pulse 60 09/16/20 1419  Resp 19 09/16/20 1419  SpO2 99 % 09/16/20 1419  Vitals shown include unvalidated device data.  Last Pain:  Vitals:   09/16/20 1002  TempSrc:   PainSc: 0-No pain         Complications: No notable events documented.

## 2020-09-16 NOTE — Progress Notes (Signed)
After waiting 30 minutes, pt ambulated again without any difficulty or bleeding. Pt will DC with her daughter who will drive and be with her for 24 hours.

## 2020-09-16 NOTE — Anesthesia Procedure Notes (Signed)
Procedure Name: Intubation Date/Time: 09/16/2020 12:21 PM Performed by: Inda Coke, CRNA Pre-anesthesia Checklist: Patient identified, Emergency Drugs available, Suction available and Patient being monitored Patient Re-evaluated:Patient Re-evaluated prior to induction Oxygen Delivery Method: Circle System Utilized Preoxygenation: Pre-oxygenation with 100% oxygen Induction Type: IV induction Ventilation: Mask ventilation without difficulty Laryngoscope Size: Mac and 3 Grade View: Grade I Tube type: Oral Tube size: 7.0 mm Number of attempts: 1 Airway Equipment and Method: Stylet and Oral airway Placement Confirmation: ETT inserted through vocal cords under direct vision, positive ETCO2 and breath sounds checked- equal and bilateral Secured at: 21 cm Tube secured with: Tape Dental Injury: Teeth and Oropharynx as per pre-operative assessment

## 2020-09-16 NOTE — H&P (Signed)
Electrophysiology Office Note   Date:  09/16/2020   ID:  Susan Bell, DOB January 18, 1941, MRN [id number]  PCP:  Shon Baton, MD  Cardiologist:  [place] Primary Electrophysiologist: Gaye Alken, MD    Chief Complaint: AF   History of Present Illness: Susan Bell is a 80 y.o. female who is being seen today for the evaluation of AF at the request of No ref. provider found. Presenting today for electrophysiology evaluation.  She has a history significant for hyperlipidemia, hypothyroidism, and atrial fibrillation.  She initially presented to the emergency room 10/26/2017.  She was cardioverted in the emergency room.  She went back into atrial fibrillation 11/03/2017 but was unaware.  She was started on beta-blockers for rate control.  She had a sleep study which showed sleep apnea.  She also had an echo that showed an ejection fraction of 35%.  She remained in rate controlled atrial fibrillation.  She noted shortness of breath.  She was admitted for dofetilide, but her QT was prolonged and she was started on amiodarone.  Unfortunately her TSH has become elevated.  Today, denies symptoms of palpitations, chest pain, shortness of breath, orthopnea, PND, lower extremity edema, claudication, dizziness, presyncope, syncope, bleeding, or neurologic sequela. The patient is tolerating medications without difficulties. Plan ablation today.    Past Medical History:  Diagnosis Date   Allergy    seasonal   Anemia    Arthritis    Cataract    bilateral removed   Celiac disease    Eczema    H/O transfusion of platelets    Hyperlipidemia    Hypothyroidism    OSA (obstructive sleep apnea)    Osteopenia    Paroxysmal atrial fibrillation (HCC)    Shingles    Vitamin D deficiency    Past Surgical History:  Procedure Laterality Date   CARDIOVERSION N/A 12/14/2017   Procedure: CARDIOVERSION;  Surgeon: Sanda Klein, MD;  Location: [place];  Service: Cardiovascular;   Laterality: N/A;   CARDIOVERSION N/A 11/07/2019   Procedure: CARDIOVERSION;  Surgeon: Jerline Pain, MD;  Location: [place];  Service: Cardiovascular;  Laterality: N/A;   CARDIOVERSION N/A 03/24/2020   Procedure: CARDIOVERSION;  Surgeon: [name], MD;  Location: [place];  Service: Cardiovascular;  Laterality: N/A;   CARDIOVERSION N/A 05/11/2020   Procedure: CARDIOVERSION;  Surgeon: Fay Records, MD;  Location: [place];  Service: Cardiovascular;  Laterality: N/A;   CATARACT EXTRACTION     bilateral   COLONOSCOPY     DILATION AND CURETTAGE OF UTERUS     UPPER GI ENDOSCOPY       Current Facility-Administered Medications  Medication Dose Route Frequency Provider Last Rate Last Admin   0.9 %  sodium chloride infusion   Intravenous Continuous Constance Haw, MD 50 mL/hr at 09/16/20 1018 New Bag at 09/16/20 1018    Allergies:   Betadine [povidone iodine] and Ferrex [iron polysaccharide]   Social History:  The patient  reports that she has never smoked. She has never used smokeless tobacco. She reports that she does not drink alcohol and does not use drugs.   Family History:  The patient's family history includes Diabetes in her father; Heart disease in her father; Liver cancer in her mother.   ROS:  Please see the history of present illness.   Otherwise, review of systems is positive for none.   All other systems are reviewed and negative.   PHYSICAL EXAM: VS:  BP (!) 179/54  Pulse 60   Temp 97.6 F (36.4 C) (Oral)   Resp 16   Ht 5' 4.5" (1.638 m)   Wt 70.8 kg   SpO2 100%   BMI 26.36 kg/m  , BMI Body mass index is 26.36 kg/m. GEN: Well nourished, well developed, in no acute distress  HEENT: normal  Neck: no JVD, carotid bruits, or masses Cardiac: RRR; no murmurs, rubs, or gallops,no edema  Respiratory:  clear to auscultation bilaterally, normal work of breathing GI: soft, nontender, nondistended, + BS MS: no deformity or atrophy  Skin: warm and  dry Neuro:  Strength and sensation are intact Psych: euthymic mood, full affect  Recent Labs: 05/18/2020: ALT 23; TSH 12.729 09/03/2020: BUN 20; Creatinine, Ser 0.78; Hemoglobin 10.6; Platelets 385; Potassium 4.3; Sodium 131    Lipid Panel  No results found for: CHOL, TRIG, HDL, CHOLHDL, VLDL, LDLCALC, LDLDIRECT   Wt Readings from Last 3 Encounters:  09/16/20 70.8 kg  08/11/20 73 kg  07/22/20 73.6 kg      Other studies Reviewed: Additional studies/ records that were reviewed today include: TTE 10/24/18  Review of the above records today demonstrates:   1. The left ventricle has normal systolic function with an ejection  fraction of 60-65%. The cavity size was normal. There is mild concentric  left ventricular hypertrophy. Left ventricular diastolic Doppler  parameters are consistent with  pseudonormalization. Elevated mean left atrial pressure.   2. The right ventricle has normal systolic function. The cavity was  normal. There is no increase in right ventricular wall thickness. Right  ventricular systolic pressure is normal with an estimated pressure of 36.9  mmHg.   3. Left atrial size was mildly dilated.   4. There is mild to moderate mitral annular calcification present.   5. The aorta is normal in size and structure.    ASSESSMENT AND PLAN:  1.  Persistent atrial fibrillation: [place] has presented today for surgery, with the diagnosis of atrial fibrillation.  The various methods of treatment have been discussed with the patient and family. After consideration of risks, benefits and other options for treatment, the patient has consented to  Procedure(s): Catheter ablation as a surgical intervention .  Risks include but not limited to complete heart block, stroke, esophageal damage, nerve damage, bleeding, vascular damage, tamponade, perforation, MI, and death. The patient's history has been reviewed, patient examined, no change in status, stable for surgery.  I have  reviewed the patient's chart and labs.  Questions were answered to the patient's satisfaction.    Zenaya Ulatowski Curt Bears, MD 09/16/2020 11:33 AM

## 2020-09-16 NOTE — Discharge Instructions (Addendum)
Post procedure care instructions No driving for 4 days. No lifting over 5 lbs for 1 week. No vigorous or sexual activity for 1 week. You may return to work/your usual activities on 09/24/20. Keep procedure site clean & dry. If you notice increased pain, swelling, bleeding or pus, call/return!  You may shower after 24 hours, but no soaking in baths/hot tubs/pools for 1 week.    Cardiac Ablation, Care After  This sheet gives you information about how to care for yourself after your procedure. Your health care provider may also give you more specific instructions. If you have problems or questions, contact your health care provider. What can I expect after the procedure? After the procedure, it is common to have: Bruising around your puncture site. Tenderness around your puncture site. Skipped heartbeats. Tiredness (fatigue).  Follow these instructions at home: Puncture site care  Follow instructions from your health care provider about how to take care of your puncture site. Make sure you: If present, leave stitches (sutures), skin glue, or adhesive strips in place. These skin closures may need to stay in place for up to 2 weeks. If adhesive strip edges start to loosen and curl up, you may trim the loose edges. Do not remove adhesive strips completely unless your health care provider tells you to do that. If a large square bandage is present, this may be removed 24 hours after surgery.  Check your puncture site every day for signs of infection. Check for: Redness, swelling, or pain. Fluid or blood. If your puncture site starts to bleed, lie down on your back, apply firm pressure to the area, and contact your health care provider. Warmth. Pus or a bad smell. Driving Do not drive for at least 4 days after your procedure or however long your health care provider recommends. (Do not resume driving if you have previously been instructed not to drive for other health reasons.) Do not drive or use  heavy machinery while taking prescription pain medicine. Activity Avoid activities that take a lot of effort for at least 7 days after your procedure. Do not lift anything that is heavier than 5 lb (4.5 kg) for one week.  No sexual activity for 1 week.  Return to your normal activities as told by your health care provider. Ask your health care provider what activities are safe for you. General instructions Take over-the-counter and prescription medicines only as told by your health care provider. Do not use any products that contain nicotine or tobacco, such as cigarettes and e-cigarettes. If you need help quitting, ask your health care provider. You may shower after 24 hours, but Do not take baths, swim, or use a hot tub for 1 week.  Do not drink alcohol for 24 hours after your procedure. Keep all follow-up visits as told by your health care provider. This is important. Contact a health care provider if: You have redness, mild swelling, or pain around your puncture site. You have fluid or blood coming from your puncture site that stops after applying firm pressure to the area. Your puncture site feels warm to the touch. You have pus or a bad smell coming from your puncture site. You have a fever. You have chest pain or discomfort that spreads to your neck, jaw, or arm. You are sweating a lot. You feel nauseous. You have a fast or irregular heartbeat. You have shortness of breath. You are dizzy or light-headed and feel the need to lie down. You have pain or numbness  in the arm or leg closest to your puncture site. Get help right away if: Your puncture site suddenly swells. Your puncture site is bleeding and the bleeding does not stop after applying firm pressure to the area. These symptoms may represent a serious problem that is an emergency. Do not wait to see if the symptoms will go away. Get medical help right away. Call your local emergency services (911 in the [place] Do not drive  yourself to the hospital. Summary After the procedure, it is normal to have bruising and tenderness at the puncture site in your groin, neck, or forearm. Check your puncture site every day for signs of infection. Get help right away if your puncture site is bleeding and the bleeding does not stop after applying firm pressure to the area. This is a medical emergency. This information is not intended to replace advice given to you by your health care provider. Make sure you discuss any questions you have with your health care provider.   You have an appointment set up with the [facility].  Multiple studies have shown that being followed by a dedicated atrial fibrillation clinic in addition to the standard care you receive from your other physicians improves health. We believe that enrollment in the atrial fibrillation clinic will allow [place] to better care for you.   The phone number to the [facility] is [phone]. The clinic is staffed Monday through Friday from 8:30am to 5pm.  Parking Directions: The clinic is located in the Heart and Vascular Building connected to [facility]. 1)From [address] turn on to [address] and go to the 3rd entrance  (Heart and Vascular entrance) on the right. 2)Look to the right for Heart &Vascular Parking Garage. 3)A code for the entrance is require, for July is 3342.   4)Take the elevators to the 1st floor. Registration is in the room with the glass walls at the end of the hallway.  If you have any trouble parking or locating the clinic, please don't hesitate to call [phone].

## 2020-09-17 ENCOUNTER — Encounter (HOSPITAL_COMMUNITY): Payer: Self-pay | Admitting: Cardiology

## 2020-09-17 LAB — POCT ACTIVATED CLOTTING TIME
Activated Clotting Time: 254 seconds
Activated Clotting Time: 306 seconds

## 2020-09-29 DIAGNOSIS — D485 Neoplasm of uncertain behavior of skin: Secondary | ICD-10-CM | POA: Diagnosis not present

## 2020-09-29 DIAGNOSIS — D0461 Carcinoma in situ of skin of right upper limb, including shoulder: Secondary | ICD-10-CM | POA: Diagnosis not present

## 2020-09-29 DIAGNOSIS — Z85828 Personal history of other malignant neoplasm of skin: Secondary | ICD-10-CM | POA: Diagnosis not present

## 2020-10-06 DIAGNOSIS — H40013 Open angle with borderline findings, low risk, bilateral: Secondary | ICD-10-CM | POA: Diagnosis not present

## 2020-10-14 ENCOUNTER — Other Ambulatory Visit: Payer: Self-pay

## 2020-10-14 ENCOUNTER — Encounter (HOSPITAL_COMMUNITY): Payer: Self-pay | Admitting: Nurse Practitioner

## 2020-10-14 ENCOUNTER — Ambulatory Visit (HOSPITAL_COMMUNITY)
Admission: RE | Admit: 2020-10-14 | Discharge: 2020-10-14 | Disposition: A | Discharge status: Home / Self Care | Payer: Medicare Other | Source: Ambulatory Visit | Attending: Nurse Practitioner | Admitting: Nurse Practitioner

## 2020-10-14 VITALS — BP 184/70 | HR 49 | Ht 64.5 in | Wt 155.0 lb

## 2020-10-14 DIAGNOSIS — I4819 Other persistent atrial fibrillation: Secondary | ICD-10-CM

## 2020-10-14 DIAGNOSIS — Z79899 Other long term (current) drug therapy: Secondary | ICD-10-CM | POA: Diagnosis not present

## 2020-10-14 DIAGNOSIS — E039 Hypothyroidism, unspecified: Secondary | ICD-10-CM | POA: Diagnosis not present

## 2020-10-14 DIAGNOSIS — Z7901 Long term (current) use of anticoagulants: Secondary | ICD-10-CM | POA: Diagnosis not present

## 2020-10-14 DIAGNOSIS — I10 Essential (primary) hypertension: Secondary | ICD-10-CM

## 2020-10-14 DIAGNOSIS — Z8249 Family history of ischemic heart disease and other diseases of the circulatory system: Secondary | ICD-10-CM | POA: Insufficient documentation

## 2020-10-14 DIAGNOSIS — E785 Hyperlipidemia, unspecified: Secondary | ICD-10-CM | POA: Diagnosis not present

## 2020-10-14 DIAGNOSIS — D6869 Other thrombophilia: Secondary | ICD-10-CM

## 2020-10-14 MED ORDER — AMLODIPINE BESYLATE 10 MG PO TABS: 10.0000 mg | ORAL_TABLET | Freq: Every day | ORAL | 3 refills | Status: DC

## 2020-10-14 NOTE — Patient Instructions (Signed)
Increase amlodipine to 61m once a day - call in week with update of blood pressure

## 2020-10-14 NOTE — Addendum Note (Signed)
Encounter addended by: Juluis Mire, RN on: 10/14/2020 11:13 AM  Actions taken: Medication long-term status modified, Order list changed

## 2020-10-14 NOTE — Addendum Note (Signed)
Encounter addended by: Sherran Needs, NP on: 10/14/2020 11:12 AM  Actions taken: Clinical Note Signed

## 2020-10-14 NOTE — Progress Notes (Addendum)
Primary Care Physician: Shon Baton, MD Referring Physician: [facility] ER f/u Cardiologist-  [name]  EP- [name] is a 80 y.o. female with a h/o HLD, hypothyroidism  that was successfully cardioverted in the [facility] ER 10/26/17 for new onset Afib. She returned to  the afib office 8/2 but had gone back into  afib and was unaware. She was started on BB for better rate control.   She denied tobacco, alcohol, excessive caffeine. Had been told she snored and appeared to stop breathing at night. Sleep study done 8/30 showed OSA. CPAP started. She had an echo that showed EF at 35% with diffuse hypokinesis. Lexi myoview did not show any ischemia but was an intermediate test.  She was already on an ARB/BB.   She remained in afib, rate controlled. She denied any exertional chest pain, is fatigued and has noted shortness of breath more so with steps. She was admitted for Tikosyn.  However after discharge qt prolonged and Tikosyn was stopped.   Since then she has had a long period of time without any afib until this past Monday. No  specific trigger but afib has been persistent since then. She feels some nausea and fatigue while in afib. Afib in the 44 's today on EKG. Echo in 2020 showed normalization of EF.   F/u afib clinic 11/14/19, following successful cardioversion. ekg today shows  Sinus brady . She feels well, improved in SR. She has had her covid shots a while back.   F/u in afib clinic, 01/02/20. She called to the office last week c/o left leg swelling since August. Rt leg no swelling. Reduced her amlodipine for a few days with mild improvement but still swelling. I scheduled her for an Venous U/S this am which was negative for DVT. Minimal swelling this am. Usually  gets more swelling  during the day and down by the next am. She does wear intermittent support socks. Tries to minimize salt. No pain  associated with the swelling. 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 The U/S   technician mentioned to her that she could see evidence of  incompetent veins.   BP elevated on presentation this am, usually well controlled. On recheck improved to 150/80. She will check again when she gets home.  F/u in afib clinic, 12/15,  for return of afib. Would like  to be cardioverted. No missed anticoagulation. She is in afib in the 90's.  F/u afib clinic, 03/31/20, failed cardioversion, despite multiple shocks. We discussed antiarrythmic's. She has failed tikoyn in the past. She has h/o HF and IVCD so flecainide/Multaq is not an option.   So dicussed amiodarone with pt as it appears to be her only option. I discussed this could be short term bridge  to ablation in the near future as she appears to be a good candidate. But she runs fast in afib so feel she will be better served to get her back in rhythm short term with amiodarone. She is in agreement.   F/u in afib clinic, 04/30/20. She has now been loading on amiodarone x one month so will proceed with cardioversion to see if SR can be restored. Pt is in agreement. No missed doses of anticoagulation.   F/u in afib clinic, 05/18/20. She did have successful cardioversion 05/11/20 and ekg today shows SR.   F/u in afib clinic, 07/22/20. She has been staying in SR with amiodarone but reports that her PCP is  concerned as her TSH is staying elevated. We discussed reducing amiodarone  to 100 mg daily and referring for an ablation to see if amiodarone can be d/c after that.   She is now in the afib clinic, 10/14/20 for f/u ablation over concern for her thyroid status and wanting to stop amiodarone. She is in Sinus brady  today. She reports no swallowing or groin issues. Her BP was elevated in the hospital post procedure and is elevated today on arrival and f/u.   Today, she denies symptoms of palpitations, chest pain, shortness of breath, orthopnea, PND, lower extremity edema, dizziness, presyncope, syncope, or neurologic sequela. The patient is tolerating  medications without difficulties and is otherwise without complaint today.   Past Medical History:  Diagnosis Date   Allergy    seasonal   Anemia    Arthritis    Cataract    bilateral removed   Celiac disease    Eczema    H/O transfusion of platelets    Hyperlipidemia    Hypothyroidism    OSA (obstructive sleep apnea)    Osteopenia    Paroxysmal atrial fibrillation (HCC)    Shingles    Vitamin D deficiency    Past Surgical History:  Procedure Laterality Date   ATRIAL FIBRILLATION ABLATION N/A 09/16/2020   Procedure: ATRIAL FIBRILLATION ABLATION;  Surgeon: Constance Haw, MD;  Location: [place] [facility];  Service: Cardiovascular;  Laterality: N/A;   CARDIOVERSION N/A 12/14/2017   Procedure: CARDIOVERSION;  Surgeon: Sanda Klein, MD;  Location: [place];  Service: Cardiovascular;  Laterality: N/A;   CARDIOVERSION N/A 11/07/2019   Procedure: CARDIOVERSION;  Surgeon: Jerline Pain, MD;  Location: [place];  Service: Cardiovascular;  Laterality: N/A;   CARDIOVERSION N/A 03/24/2020   Procedure: CARDIOVERSION;  Surgeon: [name], MD;  Location: [place];  Service: Cardiovascular;  Laterality: N/A;   CARDIOVERSION N/A 05/11/2020   Procedure: CARDIOVERSION;  Surgeon: Fay Records, MD;  Location: [place];  Service: Cardiovascular;  Laterality: N/A;   CATARACT EXTRACTION     bilateral   COLONOSCOPY     DILATION AND CURETTAGE OF UTERUS     UPPER GI ENDOSCOPY      Current Outpatient Medications  Medication Sig Dispense Refill   acetaminophen (TYLENOL) 500 MG tablet Take 1,000 mg by mouth 2 (two) times daily as needed for moderate pain.     alendronate (FOSAMAX) 70 MG tablet Take 70 mg by mouth every Tuesday.      amiodarone (PACERONE) 200 MG tablet Take 0.5 tablets (100 mg total) by mouth daily. 60 tablet 3   amLODipine (NORVASC) 5 MG tablet Take 1 tablet (5 mg total) by mouth daily. 90 tablet 2   apixaban (ELIQUIS) 5 MG TABS tablet Take 1 tablet  (5 mg total) by mouth 2 (two) times daily. 180 tablet 1   Ascorbic Acid (VITAMIN C) 500 MG CAPS Take 500 mg by mouth daily.     brimonidine (ALPHAGAN) 0.15 % ophthalmic solution Place 1 drop into the left eye 2 (two) times daily.     Calcium Carbonate-Vitamin D (CALCIUM 600+D PO) Take 1 tablet by mouth daily.     cholecalciferol (VITAMIN D) 400 units TABS tablet Take 400 Units by mouth daily.     dorzolamide-timolol (COSOPT) 22.3-6.8 MG/ML ophthalmic solution Place 1 drop into the left eye 2 (two) times daily.     fluorometholone (FML) 0.1 % ophthalmic suspension Place 1 drop into the left eye 2 (two) times daily.  gabapentin (NEURONTIN) 100 MG capsule Take 100 mg by mouth at bedtime.     hydrocortisone (ANUSOL-HC) 2.5 % rectal cream Place 1 application rectally 2 (two) times daily as needed for hemorrhoids.     levothyroxine (SYNTHROID) 88 MCG tablet Take 88 mcg by mouth daily before breakfast.     loratadine (CLARITIN) 10 MG tablet Take 10 mg by mouth every evening.      losartan (COZAAR) 50 MG tablet Take 1 tablet (50 mg total) by mouth daily. 90 tablet 3   Menthol-Methyl Salicylate (SALONPAS PAIN RELIEF PATCH EX) Apply 1 patch topically daily as needed (pain).     methocarbamol (ROBAXIN) 500 MG tablet Take 500 mg by mouth daily as needed for muscle spasms.     metoprolol succinate (TOPROL-XL) 50 MG 24 hr tablet Take 25 mg by mouth daily.     Multiple Vitamin (MULTIVITAMIN WITH MINERALS) TABS tablet Take 1 tablet by mouth daily.     Multiple Vitamins-Minerals (HAIR SKIN AND NAILS FORMULA) TABS Take 1 tablet by mouth 3 (three) times a week.     Multiple Vitamins-Minerals (OCUVITE EYE HEATLH GUMMIES PO) Take 1 capsule by mouth 3 (three) times a week.     omeprazole (PRILOSEC) 20 MG capsule Take 20 mg by mouth at bedtime.     oxybutynin (DITROPAN) 5 MG tablet Take 2.5 mg every evening by mouth.     PATADAY 0.2 % SOLN Place 1 drop into both eyes daily as needed (allergies).      rosuvastatin  (CRESTOR) 5 MG tablet Take 5 mg by mouth every evening.   3   SYSTANE ULTRA 0.4-0.3 % SOLN Place 1 drop into both eyes daily as needed (dry eyes).      traMADol (ULTRAM) 50 MG tablet Take 50 mg by mouth 2 (two) times daily as needed for moderate pain.     No current facility-administered medications for this encounter.    Allergies  Allergen Reactions   Betadine [Povidone Iodine] Itching    With prolonged usage    Tommas Olp [Iron Polysaccharide]     Severe constipation     Social History   Socioeconomic History   Marital status: Widowed    Spouse name: Not on file   Number of children: 4   Years of education: Not on file   Highest education level: Not on file  Occupational History   Occupation: retired    Fish farm manager: RETIRED  Tobacco Use   Smoking status: Never   Smokeless tobacco: Never  Vaping Use   Vaping Use: Never used  Substance and Sexual Activity   Alcohol use: No    Alcohol/week: 0.0 standard drinks   Drug use: No   Sexual activity: Not on file  Other Topics Concern   Not on file  Social History Narrative   Daily caffeine   Social Determinants of Health   Financial Resource Strain: Not on file  Food Insecurity: Not on file  Transportation Needs: Not on file  Physical Activity: Not on file  Stress: Not on file  Social Connections: Not on file  Intimate Partner Violence: Not on file    Family History  Problem Relation Age of Onset   Liver cancer Mother    Diabetes Father    Heart disease Father    Colon cancer Neg Hx     ROS- All systems are reviewed and negative except as per the HPI above  Physical Exam: Vitals:   10/14/20 1023  Weight: 70.3 kg  Height: 5'  4.5" (1.638 m)   Wt Readings from Last 3 Encounters:  10/14/20 70.3 kg  09/16/20 70.8 kg  08/11/20 73 kg    Labs: Lab Results  Component Value Date   NA 131 (L) 09/03/2020   K 4.3 09/03/2020   CL 96 09/03/2020   CO2 19 (L) 09/03/2020   GLUCOSE 135 (H) 09/03/2020   BUN 20  09/03/2020   CREATININE 0.78 09/03/2020   CALCIUM 9.5 09/03/2020   MG 2.0 12/25/2017   No results found for: INR No results found for: CHOL, HDL, LDLCALC, TRIG   GEN- The patient is well appearing, alert and oriented x 3 today.   Head- normocephalic, atraumatic Eyes-  Sclera clear, conjunctiva pink Ears- hearing intact Oropharynx- clear Neck- supple, no JVP Lymph- no cervical lymphadenopathy Lungs- Clear to ausculation bilaterally, normal work of breathing Heart- slow regular rate and rhythm, no murmurs, rubs or gallops, PMI not laterally displaced GI- soft, NT, ND, + BS Extremities- no clubbing, cyanosis, or edema, + for varicose veins  MS- no significant deformity or atrophy Skin- no rash or lesion Psych- euthymic mood, full affect Neuro- strength and sensation are intact  EKG-Sinus brady at 49  bpm with first degree AV block, pr int 224 ms, qrs int 146 ms, qtc 408  ms Epic records reviewed  Echo- 10/24/18 1. The left ventricle has normal systolic function with an ejection  fraction of 60-65%. The cavity size was normal. There is mild concentric  left ventricular hypertrophy. Left ventricular diastolic Doppler  parameters are consistent with  pseudonormalization. Elevated mean left atrial pressure.   2. The right ventricle has normal systolic function. The cavity was  normal. There is no increase in right ventricular wall thickness. Right  ventricular systolic pressure is normal with an estimated pressure of 36.9  mmHg.   3. Left atrial size was mildly dilated.   4. There is mild to moderate mitral annular calcification present.   5. The aorta is normal in size and structure.     Assessment and Plan: 1. Persistent  afib S/p ablation and is maintaining  Sinus brady  I will ask Dr. Curt Bears if amio 100 mg can be stopped now as it is aggravating thyroid status  Failed tikosyn in the past  Continue  metoprolol succinate 25 mg daily    2. Chadsvasc score of  3 Continue  eliquis 5 mg bid without interruption   3. HTN Elevated on presentation and on recheck 180/80 I will increase amlodipine to 10 mg daily She will call the office in 7-10 days with BP readings   F/u as  with Dr. Curt Bears as scheduled 12/15/20 Afib clinic as needed   Addendum- Dr. Curt Bears responded ok to stop amiodarone.   Geroge Baseman Willye Javier, [facility] [address] [place], [place] [address] [phone]

## 2020-10-15 DIAGNOSIS — E039 Hypothyroidism, unspecified: Secondary | ICD-10-CM | POA: Diagnosis not present

## 2020-10-26 ENCOUNTER — Telehealth (HOSPITAL_COMMUNITY): Payer: Self-pay | Admitting: *Deleted

## 2020-10-26 DIAGNOSIS — Z85828 Personal history of other malignant neoplasm of skin: Secondary | ICD-10-CM | POA: Diagnosis not present

## 2020-10-26 DIAGNOSIS — L929 Granulomatous disorder of the skin and subcutaneous tissue, unspecified: Secondary | ICD-10-CM | POA: Diagnosis not present

## 2020-10-26 NOTE — Telephone Encounter (Signed)
Pt called with update of BP after increase of amlodipine.  BP ranging 125-137/54-58. Pt tolerating medication well. She will continue at current dose.

## 2020-11-02 ENCOUNTER — Other Ambulatory Visit: Payer: Self-pay | Admitting: Cardiology

## 2020-11-02 ENCOUNTER — Telehealth: Payer: Self-pay | Admitting: Cardiology

## 2020-11-02 DIAGNOSIS — Z1231 Encounter for screening mammogram for malignant neoplasm of breast: Secondary | ICD-10-CM | POA: Diagnosis not present

## 2020-11-02 NOTE — Telephone Encounter (Signed)
*  STAT* If patient is at the pharmacy, call can be transferred to refill team.   1. Which medications need to be refilled? (please list name of each medication and dose if known)  new prescription for Eliquis  2. Which pharmacy/location (including street and city if local pharmacy) is medication to be sent to? [facility]  3. Do they need a 30 day or 90 day supply? 90 days and refills

## 2020-11-03 MED ORDER — APIXABAN 5 MG PO TABS: 5.0000 mg | ORAL_TABLET | Freq: Two times a day (BID) | ORAL | 1 refills | Status: DC

## 2020-11-03 NOTE — Telephone Encounter (Signed)
Prescription refill request for Eliquis received. Indication:afib Last office visit:carroll 10/14/20 Scr:0.78 09/03/20 Age: 44fWeight:73kg

## 2020-11-03 NOTE — Telephone Encounter (Signed)
Prescription refill request for Eliquis received. Indication:afib Last office visit:carroll 10/14/20 Scr:0.78 09/03/20 Age: 26fWeight:73kg Refill was authorized

## 2020-11-16 DIAGNOSIS — H40013 Open angle with borderline findings, low risk, bilateral: Secondary | ICD-10-CM | POA: Diagnosis not present

## 2020-11-24 ENCOUNTER — Telehealth (HOSPITAL_COMMUNITY): Payer: Self-pay | Admitting: *Deleted

## 2020-11-24 MED ORDER — AMLODIPINE BESYLATE 10 MG PO TABS: 5.0000 mg | ORAL_TABLET | Freq: Every day | ORAL | 3 refills | Status: DC

## 2020-11-24 NOTE — Telephone Encounter (Signed)
Patient called in stating she is having significant LLE since increasing amlodipine to 24m daily.  Discussed with DRoderic PalauNP will decrease amlodipine to 576m- she will call in a week with update of BP.

## 2020-12-01 MED ORDER — FUROSEMIDE 20 MG PO TABS: ORAL_TABLET | ORAL | 0 refills | Status: DC

## 2020-12-01 NOTE — Addendum Note (Signed)
Addended by: Juluis Mire on: 12/01/2020 04:38 PM   Modules accepted: Orders

## 2020-12-01 NOTE — Telephone Encounter (Signed)
Patient called with update of BP after reduction of amlodipine. BP ranging from 120-127/56-58 pt still endorses significant lower extremity swelling. Discussed with Roderic Palau NP will call in lasix 59m once a day for 5 days then stop. BMET in 1 week. Pt in agreement.

## 2020-12-09 ENCOUNTER — Ambulatory Visit (HOSPITAL_COMMUNITY)
Admission: RE | Admit: 2020-12-09 | Discharge: 2020-12-09 | Disposition: A | Discharge status: Home / Self Care | Payer: Medicare Other | Source: Ambulatory Visit | Attending: Nurse Practitioner | Admitting: Nurse Practitioner

## 2020-12-09 ENCOUNTER — Other Ambulatory Visit: Payer: Self-pay

## 2020-12-09 DIAGNOSIS — Z7901 Long term (current) use of anticoagulants: Secondary | ICD-10-CM | POA: Insufficient documentation

## 2020-12-09 DIAGNOSIS — I4819 Other persistent atrial fibrillation: Secondary | ICD-10-CM | POA: Insufficient documentation

## 2020-12-09 DIAGNOSIS — I10 Essential (primary) hypertension: Secondary | ICD-10-CM | POA: Diagnosis not present

## 2020-12-09 DIAGNOSIS — D6869 Other thrombophilia: Secondary | ICD-10-CM | POA: Diagnosis not present

## 2020-12-09 DIAGNOSIS — Z79899 Other long term (current) drug therapy: Secondary | ICD-10-CM | POA: Diagnosis not present

## 2020-12-09 LAB — BASIC METABOLIC PANEL
Anion gap: 5 (ref 5–15)
BUN: 11 mg/dL (ref 8–23)
CO2: 26 mmol/L (ref 22–32)
Calcium: 8.8 mg/dL — ABNORMAL LOW (ref 8.9–10.3)
Chloride: 97 mmol/L — ABNORMAL LOW (ref 98–111)
Creatinine, Ser: 0.76 mg/dL (ref 0.44–1.00)
GFR, Estimated: >60 mL/min (ref >60)
Glucose, Bld: 97 mg/dL (ref 70–99)
Potassium: 4 mmol/L (ref 3.5–5.1)
Sodium: 128 mmol/L — ABNORMAL LOW (ref 135–145)

## 2020-12-09 LAB — TSH: TSH: 5.131 u[IU]/mL — ABNORMAL HIGH (ref 0.350–4.500)

## 2020-12-09 NOTE — Progress Notes (Signed)
Susan Bell was in for a BP check for calling a couple weeks ago for elevation of BP. I increased her amlodipine and then she called reporting swelling of her LE"s. Amlodipine was lowered back to her usual dose of 5 mg and with a few days of lasix the swelling improved except for L leg. I worked up her  swelling of her L leg last September. 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 She sees her PCP next week and can ask if he feels she needs to see a vein specialist. She tried hospital grade support socks before but found it difficult to put on. I suggested just get   knee high compression socks that can be bought at various drug stores, [place], [place] for some compression but not hospital grade. She came off amiodarone after her ablation a few months ago. Bmet/tsh today   Her BP is the office is elevated at 160/60. I rechecked at 148/80. At home she reports that she gets readings of 544-920 systolic. She has f/iu with Dr. Curt Bears next week. Suggested that she take and record readings  and discuss when she sees him. Possibly losartan could be increased to 100 mg daily if needed.

## 2020-12-15 ENCOUNTER — Encounter: Payer: Self-pay | Admitting: Cardiology

## 2020-12-15 ENCOUNTER — Other Ambulatory Visit: Payer: Self-pay

## 2020-12-15 ENCOUNTER — Ambulatory Visit (INDEPENDENT_AMBULATORY_CARE_PROVIDER_SITE_OTHER): Payer: Medicare Other | Admitting: Cardiology

## 2020-12-15 VITALS — BP 130/74 | HR 59 | Ht 64.0 in | Wt 156.8 lb

## 2020-12-15 DIAGNOSIS — I4819 Other persistent atrial fibrillation: Secondary | ICD-10-CM

## 2020-12-15 NOTE — Patient Instructions (Signed)
Medication Instructions:  Your physician recommends that you continue on your current medications as directed. Please refer to the Current Medication list given to you today.  *If you need a refill on your cardiac medications before your next appointment, please call your pharmacy*   Lab Work: None ordered   Testing/Procedures: None ordered   Follow-Up: At [facility], you and your health needs are our priority.  As part of our continuing mission to provide you with exceptional heart care, we have created designated Provider Care Teams.  These Care Teams include your primary Cardiologist (physician) and Advanced Practice Providers (APPs -  Physician Assistants and Nurse Practitioners) who all work together to provide you with the care you need, when you need it.  Your next appointment:   3 month(s)  The format for your next appointment:   In Person  Provider:   Allegra Lai, MD    Thank you for choosing [facility]!!   Trinidad Curet, RN [phone]

## 2020-12-15 NOTE — Progress Notes (Signed)
Electrophysiology Office Note   Date:  12/15/2020   ID:  Susan Bell, DOB 08-18-1940, MRN [id number]  PCP:  Shon Baton, MD  Cardiologist:  Hochrein Primary Electrophysiologist: Gaye Alken, MD    Chief Complaint: AF   History of Present Illness: Susan Bell is a 80 y.o. female who is being seen today for the evaluation of AF at the request of Shon Baton, MD. Presenting today for electrophysiology evaluation.  She has a history significant for hyperlipidemia, hypothyroidism, and atrial fibrillation.  She initially presented the emergency room 10/26/2017.  She was cardioverted in the emergency room.  She went back into atrial fibrillation 1819 but was unaware.  She was started on beta-blockers for rate control.  She had a sleep study that showed sleep apnea.  Echo showed an ejection fraction of 35%.  She remained in rate controlled atrial fibrillation with increased shortness of breath.  She was admitted for dofetilide load, but her QT became prolonged.  She was loaded on amiodarone.  She is now status post A. fib ablation 09/16/2020.  Today, denies symptoms of palpitations, chest pain, shortness of breath, orthopnea, PND, lower extremity edema, claudication, dizziness, presyncope, syncope, bleeding, or neurologic sequela. The patient is tolerating medications without difficulties.  Since being seen she has done well.  She has noted no further episodes of atrial fibrillation.  She is overall comfortable with her control.  She was having some thyroid issues and her amiodarone was stopped when she was seen in atrial fibrillation clinic.  She states that her thyroid has been improving since then.   Past Medical History:  Diagnosis Date   Allergy    seasonal   Anemia    Arthritis    Cataract    bilateral removed   Celiac disease    Eczema    H/O transfusion of platelets    Hyperlipidemia    Hypothyroidism    OSA (obstructive sleep apnea)    Osteopenia     Paroxysmal atrial fibrillation (HCC)    Shingles    Vitamin D deficiency    Past Surgical History:  Procedure Laterality Date   ATRIAL FIBRILLATION ABLATION N/A 09/16/2020   Procedure: ATRIAL FIBRILLATION ABLATION;  Surgeon: Constance Haw, MD;  Location: [place] [facility];  Service: Cardiovascular;  Laterality: N/A;   CARDIOVERSION N/A 12/14/2017   Procedure: CARDIOVERSION;  Surgeon: Sanda Klein, MD;  Location: [place];  Service: Cardiovascular;  Laterality: N/A;   CARDIOVERSION N/A 11/07/2019   Procedure: CARDIOVERSION;  Surgeon: Jerline Pain, MD;  Location: [place];  Service: Cardiovascular;  Laterality: N/A;   CARDIOVERSION N/A 03/24/2020   Procedure: CARDIOVERSION;  Surgeon: [name], MD;  Location: [place];  Service: Cardiovascular;  Laterality: N/A;   CARDIOVERSION N/A 05/11/2020   Procedure: CARDIOVERSION;  Surgeon: Fay Records, MD;  Location: [place];  Service: Cardiovascular;  Laterality: N/A;   CATARACT EXTRACTION     bilateral   COLONOSCOPY     DILATION AND CURETTAGE OF UTERUS     UPPER GI ENDOSCOPY       Current Outpatient Medications  Medication Sig Dispense Refill   acetaminophen (TYLENOL) 500 MG tablet Take 1,000 mg by mouth 2 (two) times daily as needed for moderate pain.     alendronate (FOSAMAX) 70 MG tablet Take 70 mg by mouth every Tuesday.      amLODipine (NORVASC) 10 MG tablet Take 0.5 tablets (5 mg total) by mouth daily. 30 tablet 3  apixaban (ELIQUIS) 5 MG TABS tablet Take 1 tablet (5 mg total) by mouth 2 (two) times daily. 180 tablet 1   Ascorbic Acid (VITAMIN C) 500 MG CAPS Take 500 mg by mouth daily.     brimonidine (ALPHAGAN) 0.15 % ophthalmic solution Place 1 drop into the left eye 2 (two) times daily.     Calcium Carbonate-Vitamin D (CALCIUM 600+D PO) Take 1 tablet by mouth daily.     cholecalciferol (VITAMIN D) 400 units TABS tablet Take 400 Units by mouth daily.     dorzolamide-timolol (COSOPT) 22.3-6.8 MG/ML  ophthalmic solution Place 1 drop into the left eye 2 (two) times daily.     fluorometholone (FML) 0.1 % ophthalmic suspension Place 1 drop into the left eye 2 (two) times daily.     furosemide (LASIX) 20 MG tablet Take 1 tablet by mouth daily for 5 days then only as needed for swelling/wt gain 10 tablet 0   gabapentin (NEURONTIN) 100 MG capsule Take 100 mg by mouth at bedtime.     hydrocortisone (ANUSOL-HC) 2.5 % rectal cream Place 1 application rectally 2 (two) times daily as needed for hemorrhoids.     levothyroxine (SYNTHROID) 88 MCG tablet Take 88 mcg by mouth daily before breakfast.     loratadine (CLARITIN) 10 MG tablet Take 10 mg by mouth every evening.      losartan (COZAAR) 50 MG tablet Take 1 tablet (50 mg total) by mouth daily. 90 tablet 3   Menthol-Methyl Salicylate (SALONPAS PAIN RELIEF PATCH EX) Apply 1 patch topically daily as needed (pain).     methocarbamol (ROBAXIN) 500 MG tablet Take 500 mg by mouth daily as needed for muscle spasms.     metoprolol succinate (TOPROL-XL) 50 MG 24 hr tablet Take 25 mg by mouth daily.     Multiple Vitamin (MULTIVITAMIN WITH MINERALS) TABS tablet Take 1 tablet by mouth daily.     Multiple Vitamins-Minerals (HAIR SKIN AND NAILS FORMULA) TABS Take 1 tablet by mouth 3 (three) times a week.     Multiple Vitamins-Minerals (OCUVITE EYE HEATLH GUMMIES PO) Take 1 capsule by mouth 3 (three) times a week.     omeprazole (PRILOSEC) 20 MG capsule Take 20 mg by mouth at bedtime.     oxybutynin (DITROPAN) 5 MG tablet Take 2.5 mg every evening by mouth.     PATADAY 0.2 % SOLN Place 1 drop into both eyes daily as needed (allergies).      rosuvastatin (CRESTOR) 5 MG tablet Take 5 mg by mouth every evening.   3   SYSTANE ULTRA 0.4-0.3 % SOLN Place 1 drop into both eyes daily as needed (dry eyes).      traMADol (ULTRAM) 50 MG tablet Take 50 mg by mouth 2 (two) times daily as needed for moderate pain.     No current facility-administered medications for this visit.     Allergies:   Betadine [povidone iodine] and Ferrex [iron polysaccharide]   Social History:  The patient  reports that she has never smoked. She has never used smokeless tobacco. She reports that she does not drink alcohol and does not use drugs.   Family History:  The patient's family history includes Diabetes in her father; Heart disease in her father; Liver cancer in her mother.   ROS:  Please see the history of present illness.   Otherwise, review of systems is positive for none.   All other systems are reviewed and negative.   PHYSICAL EXAM: VS:  There were no  vitals taken for this visit. , BMI There is no height or weight on file to calculate BMI. GEN: Well nourished, well developed, in no acute distress  HEENT: normal  Neck: no JVD, carotid bruits, or masses Cardiac: RRR; no murmurs, rubs, or gallops,no edema  Respiratory:  clear to auscultation bilaterally, normal work of breathing GI: soft, nontender, nondistended, + BS MS: no deformity or atrophy  Skin: warm and dry Neuro:  Strength and sensation are intact Psych: euthymic mood, full affect  EKG:  EKG is ordered today. Personal review of the ekg ordered shows sinus rhythm, rate 59   Recent Labs: 05/18/2020: ALT 23 09/03/2020: Hemoglobin 10.6; Platelets 385 12/09/2020: BUN 11; Creatinine, Ser 0.76; Potassium 4.0; Sodium 128; TSH 5.131    Lipid Panel  No results found for: CHOL, TRIG, HDL, CHOLHDL, VLDL, LDLCALC, LDLDIRECT   Wt Readings from Last 3 Encounters:  12/09/20 156 lb 6.4 oz (70.9 kg)  10/14/20 155 lb (70.3 kg)  09/16/20 156 lb (70.8 kg)      Other studies Reviewed: Additional studies/ records that were reviewed today include: TTE 10/24/18  Review of the above records today demonstrates:   1. The left ventricle has normal systolic function with an ejection  fraction of 60-65%. The cavity size was normal. There is mild concentric  left ventricular hypertrophy. Left ventricular diastolic Doppler   parameters are consistent with  pseudonormalization. Elevated mean left atrial pressure.   2. The right ventricle has normal systolic function. The cavity was  normal. There is no increase in right ventricular wall thickness. Right  ventricular systolic pressure is normal with an estimated pressure of 36.9  mmHg.   3. Left atrial size was mildly dilated.   4. There is mild to moderate mitral annular calcification present.   5. The aorta is normal in size and structure.    ASSESSMENT AND PLAN:  1.  Persistent atrial fibrillation: Currently on Eliquis 5 mg twice daily.  CHA2DS2-VASc of 3.  Is now status post ablation 09/16/2020.  High risk medication monitoring via ECG.  Amiodarone was stopped at her last A. fib clinic visit due to thyroid abnormalities.  She is currently feeling well and is noted no further episodes of atrial fibrillation.  Continue with current management.  2.  Hypertension: Currently well controlled  Current medicines are reviewed at length with the patient today.   The patient does not have concerns regarding her medicines.  The following changes were made today: None  Labs/ tests ordered today include:  No orders of the defined types were placed in this encounter.    Disposition:   FU with Natahsa Marian 3 months  Signed, Octavian Godek Meredith Leeds, MD  12/15/2020 9:56 AM     [facility] [address] [place] [address] [phone] (office) [phone] (fax)

## 2020-12-16 DIAGNOSIS — M25512 Pain in left shoulder: Secondary | ICD-10-CM | POA: Diagnosis not present

## 2020-12-16 DIAGNOSIS — M25511 Pain in right shoulder: Secondary | ICD-10-CM | POA: Diagnosis not present

## 2020-12-16 DIAGNOSIS — M1712 Unilateral primary osteoarthritis, left knee: Secondary | ICD-10-CM | POA: Diagnosis not present

## 2020-12-18 DIAGNOSIS — M8580 Other specified disorders of bone density and structure, unspecified site: Secondary | ICD-10-CM | POA: Diagnosis not present

## 2020-12-18 DIAGNOSIS — E785 Hyperlipidemia, unspecified: Secondary | ICD-10-CM | POA: Diagnosis not present

## 2020-12-18 DIAGNOSIS — I110 Hypertensive heart disease with heart failure: Secondary | ICD-10-CM | POA: Diagnosis not present

## 2020-12-18 DIAGNOSIS — I700 Atherosclerosis of aorta: Secondary | ICD-10-CM | POA: Diagnosis not present

## 2020-12-18 DIAGNOSIS — I4891 Unspecified atrial fibrillation: Secondary | ICD-10-CM | POA: Diagnosis not present

## 2020-12-18 DIAGNOSIS — E039 Hypothyroidism, unspecified: Secondary | ICD-10-CM | POA: Diagnosis not present

## 2020-12-18 DIAGNOSIS — D692 Other nonthrombocytopenic purpura: Secondary | ICD-10-CM | POA: Diagnosis not present

## 2020-12-18 DIAGNOSIS — G8929 Other chronic pain: Secondary | ICD-10-CM | POA: Diagnosis not present

## 2020-12-18 DIAGNOSIS — Z7901 Long term (current) use of anticoagulants: Secondary | ICD-10-CM | POA: Diagnosis not present

## 2020-12-18 DIAGNOSIS — Z23 Encounter for immunization: Secondary | ICD-10-CM | POA: Diagnosis not present

## 2020-12-18 DIAGNOSIS — I2510 Atherosclerotic heart disease of native coronary artery without angina pectoris: Secondary | ICD-10-CM | POA: Diagnosis not present

## 2020-12-18 DIAGNOSIS — D6869 Other thrombophilia: Secondary | ICD-10-CM | POA: Diagnosis not present

## 2020-12-18 DIAGNOSIS — M8589 Other specified disorders of bone density and structure, multiple sites: Secondary | ICD-10-CM | POA: Diagnosis not present

## 2021-01-13 DIAGNOSIS — L814 Other melanin hyperpigmentation: Secondary | ICD-10-CM | POA: Diagnosis not present

## 2021-01-13 DIAGNOSIS — L910 Hypertrophic scar: Secondary | ICD-10-CM | POA: Diagnosis not present

## 2021-01-13 DIAGNOSIS — L821 Other seborrheic keratosis: Secondary | ICD-10-CM | POA: Diagnosis not present

## 2021-01-13 DIAGNOSIS — Z85828 Personal history of other malignant neoplasm of skin: Secondary | ICD-10-CM | POA: Diagnosis not present

## 2021-02-18 ENCOUNTER — Other Ambulatory Visit (HOSPITAL_COMMUNITY): Payer: Self-pay | Admitting: Nurse Practitioner

## 2021-02-22 DIAGNOSIS — M25512 Pain in left shoulder: Secondary | ICD-10-CM | POA: Diagnosis not present

## 2021-03-11 DIAGNOSIS — M1712 Unilateral primary osteoarthritis, left knee: Secondary | ICD-10-CM | POA: Diagnosis not present

## 2021-03-23 ENCOUNTER — Other Ambulatory Visit: Payer: Self-pay

## 2021-03-23 ENCOUNTER — Encounter: Payer: Self-pay | Admitting: Cardiology

## 2021-03-23 ENCOUNTER — Ambulatory Visit (INDEPENDENT_AMBULATORY_CARE_PROVIDER_SITE_OTHER): Payer: Medicare Other | Admitting: Cardiology

## 2021-03-23 VITALS — BP 140/60 | HR 59 | Ht 64.0 in | Wt 152.0 lb

## 2021-03-23 DIAGNOSIS — I4819 Other persistent atrial fibrillation: Secondary | ICD-10-CM

## 2021-03-23 NOTE — Patient Instructions (Signed)
Medication Instructions:  Your physician recommends that you continue on your current medications as directed. Please refer to the Current Medication list given to you today.  *If you need a refill on your cardiac medications before your next appointment, please call your pharmacy*   Lab Work: None ordered   Testing/Procedures: None ordered   Follow-Up: At [facility], you and your health needs are our priority.  As part of our continuing mission to provide you with exceptional heart care, we have created designated Provider Care Teams.  These Care Teams include your primary Cardiologist (physician) and Advanced Practice Providers (APPs -  Physician Assistants and Nurse Practitioners) who all work together to provide you with the care you need, when you need it.  Your next appointment:   6 month(s)  The format for your next appointment:   In Person  Provider:   Allegra Lai, MD    Thank you for choosing [facility]!!   Trinidad Curet, RN [phone]

## 2021-03-23 NOTE — Progress Notes (Signed)
Electrophysiology Office Note   Date:  03/23/2021   ID:  Susan Bell, DOB [birth year], MRN [id number]  PCP:  Shon Baton, MD  Cardiologist:  Hochrein Primary Electrophysiologist: Gaye Alken, MD    Chief Complaint: AF   History of Present Illness: Susan Bell is a 80 y.o. female who is being seen today for the evaluation of AF at the request of Shon Baton, MD. Presenting today for electrophysiology evaluation.  She has a history significant for hyperlipidemia, hypothyroidism, atrial fibrillation.  She initially presented emergency room 10/26/2017.  She was cardioverted in the emergency room.  She went back into atrial fibrillation but was unaware.  She was started on beta-blockers for rate control.  Sleep study showed sleep apnea.  Echo showed an ejection fraction of 35%.  She was admitted for dofetilide load, but her QT was prolonged.  She was started on amiodarone.  She has now status post atrial fibrillation ablation 09/16/2020.  Today, denies symptoms of palpitations, chest pain, shortness of breath, orthopnea, PND, lower extremity edema, claudication, dizziness, presyncope, syncope, bleeding, or neurologic sequela. The patient is tolerating medications without difficulties.  Since being seen she has done well.  She has noted no further episodes of atrial fibrillation.  She checks her heart rhythm on her cardia mobile each time she feels like she is out of rhythm.  Every time she checks she has been in sinus.  She is overall happy with her control.   Past Medical History:  Diagnosis Date   Allergy    seasonal   Anemia    Arthritis    Cataract    bilateral removed   Celiac disease    Eczema    H/O transfusion of platelets    Hyperlipidemia    Hypothyroidism    OSA (obstructive sleep apnea)    Osteopenia    Paroxysmal atrial fibrillation (HCC)    Shingles    Vitamin D deficiency    Past Surgical History:  Procedure Laterality Date   ATRIAL  FIBRILLATION ABLATION N/A 09/16/2020   Procedure: ATRIAL FIBRILLATION ABLATION;  Surgeon: Constance Haw, MD;  Location: [place] [facility];  Service: Cardiovascular;  Laterality: N/A;   CARDIOVERSION N/A 12/14/2017   Procedure: CARDIOVERSION;  Surgeon: Sanda Klein, MD;  Location: [place];  Service: Cardiovascular;  Laterality: N/A;   CARDIOVERSION N/A 11/07/2019   Procedure: CARDIOVERSION;  Surgeon: Jerline Pain, MD;  Location: [place];  Service: Cardiovascular;  Laterality: N/A;   CARDIOVERSION N/A 03/24/2020   Procedure: CARDIOVERSION;  Surgeon: [name], MD;  Location: [place];  Service: Cardiovascular;  Laterality: N/A;   CARDIOVERSION N/A 05/11/2020   Procedure: CARDIOVERSION;  Surgeon: Fay Records, MD;  Location: [place];  Service: Cardiovascular;  Laterality: N/A;   CATARACT EXTRACTION     bilateral   COLONOSCOPY     DILATION AND CURETTAGE OF UTERUS     UPPER GI ENDOSCOPY       Current Outpatient Medications  Medication Sig Dispense Refill   acetaminophen (TYLENOL) 500 MG tablet Take 1,000 mg by mouth 2 (two) times daily as needed for moderate pain.     alendronate (FOSAMAX) 70 MG tablet Take 70 mg by mouth every 14 (fourteen) days.     amLODipine (NORVASC) 10 MG tablet Take 0.5 tablets (5 mg total) by mouth daily. 45 tablet 3   apixaban (ELIQUIS) 5 MG TABS tablet Take 1 tablet (5 mg total) by mouth 2 (two) times daily. 180 tablet  1   Ascorbic Acid (VITAMIN C) 500 MG CAPS Take 500 mg by mouth daily.     brimonidine (ALPHAGAN) 0.15 % ophthalmic solution Place 1 drop into the left eye 2 (two) times daily.     Calcium Carbonate-Vitamin D (CALCIUM 600+D PO) Take 1 tablet by mouth daily.     cholecalciferol (VITAMIN D) 400 units TABS tablet Take 400 Units by mouth daily.     dorzolamide-timolol (COSOPT) 22.3-6.8 MG/ML ophthalmic solution Place 1 drop into the left eye 2 (two) times daily.     fluorometholone (FML) 0.1 % ophthalmic suspension  Place 1 drop into the left eye daily.     levothyroxine (SYNTHROID) 88 MCG tablet Take 88 mcg by mouth daily before breakfast.     loratadine (CLARITIN) 10 MG tablet Take 10 mg by mouth every evening.      losartan (COZAAR) 50 MG tablet Take 1 tablet (50 mg total) by mouth daily. 90 tablet 3   Menthol-Methyl Salicylate (SALONPAS PAIN RELIEF PATCH EX) Apply 1 patch topically daily as needed (pain).     methocarbamol (ROBAXIN) 500 MG tablet Take 500 mg by mouth daily as needed for muscle spasms.     metoprolol succinate (TOPROL-XL) 50 MG 24 hr tablet Take 25 mg by mouth daily.     Multiple Vitamin (MULTIVITAMIN WITH MINERALS) TABS tablet Take 1 tablet by mouth daily.     Multiple Vitamins-Minerals (HAIR SKIN AND NAILS FORMULA) TABS Take 1 tablet by mouth 3 (three) times a week.     PATADAY 0.2 % SOLN Place 1 drop into both eyes daily as needed (allergies).      rosuvastatin (CRESTOR) 5 MG tablet Take 5 mg by mouth every evening.   3   SYSTANE ULTRA 0.4-0.3 % SOLN Place 1 drop into both eyes daily as needed (dry eyes).      traMADol (ULTRAM) 50 MG tablet Take 50 mg by mouth 2 (two) times daily as needed for moderate pain.     hydrocortisone (ANUSOL-HC) 2.5 % rectal cream Place 1 application rectally 2 (two) times daily as needed for hemorrhoids. (Patient not taking: Reported on 03/23/2021)     Multiple Vitamins-Minerals (OCUVITE EYE HEATLH GUMMIES PO) Take 1 capsule by mouth 3 (three) times a week. (Patient not taking: Reported on 03/23/2021)     omeprazole (PRILOSEC) 20 MG capsule Take 20 mg by mouth at bedtime. (Patient not taking: Reported on 03/23/2021)     No current facility-administered medications for this visit.    Allergies:   Betadine [povidone iodine] and Ferrex [iron polysaccharide]   Social History:  The patient  reports that she has never smoked. She has never used smokeless tobacco. She reports that she does not drink alcohol and does not use drugs.   Family History:  The  patient's family history includes Diabetes in her father; Heart disease in her father; Liver cancer in her mother.   ROS:  Please see the history of present illness.   Otherwise, review of systems is positive for none.   All other systems are reviewed and negative.   PHYSICAL EXAM: VS:  BP 140/60    Pulse (!) 59    Ht 5' 4"  (1.626 m)    Wt 152 lb (68.9 kg)    SpO2 97%    BMI 26.09 kg/m  , BMI Body mass index is 26.09 kg/m. GEN: Well nourished, well developed, in no acute distress  HEENT: normal  Neck: no JVD, carotid bruits, or masses Cardiac: RRR;  no murmurs, rubs, or gallops,no edema  Respiratory:  clear to auscultation bilaterally, normal work of breathing GI: soft, nontender, nondistended, + BS MS: no deformity or atrophy  Skin: warm and dry Neuro:  Strength and sensation are intact Psych: euthymic mood, full affect  EKG:  EKG is ordered today. Personal review of the ekg ordered shows sinus rhythm, rate 59  Recent Labs: 05/18/2020: ALT 23 09/03/2020: Hemoglobin 10.6; Platelets 385 12/09/2020: BUN 11; Creatinine, Ser 0.76; Potassium 4.0; Sodium 128; TSH 5.131    Lipid Panel  No results found for: CHOL, TRIG, HDL, CHOLHDL, VLDL, LDLCALC, LDLDIRECT   Wt Readings from Last 3 Encounters:  03/23/21 152 lb (68.9 kg)  12/15/20 156 lb 12.8 oz (71.1 kg)  12/09/20 156 lb 6.4 oz (70.9 kg)      Other studies Reviewed: Additional studies/ records that were reviewed today include: TTE 10/24/18  Review of the above records today demonstrates:   1. The left ventricle has normal systolic function with an ejection  fraction of 60-65%. The cavity size was normal. There is mild concentric  left ventricular hypertrophy. Left ventricular diastolic Doppler  parameters are consistent with  pseudonormalization. Elevated mean left atrial pressure.   2. The right ventricle has normal systolic function. The cavity was  normal. There is no increase in right ventricular wall thickness. Right   ventricular systolic pressure is normal with an estimated pressure of 36.9  mmHg.   3. Left atrial size was mildly dilated.   4. There is mild to moderate mitral annular calcification present.   5. The aorta is normal in size and structure.    ASSESSMENT AND PLAN:  1.  Persistent atrial fibrillation: Currently on Eliquis 5 mg twice daily.  CHA2DS2-VASc of 3.  Status post ablation 09/16/2020.  She is remained in sinus rhythm.  She is quite happy with her control.  No changes.  2.  Hypertension: Currently well controlled  Current medicines are reviewed at length with the patient today.   The patient does not have concerns regarding her medicines.  The following changes were made today: None  Labs/ tests ordered today include:  Orders Placed This Encounter  Procedures   EKG 12-Lead      Disposition:   FU with Amulya Quintin 6 months  Signed, Daylynn Stumpp Meredith Leeds, MD  03/23/2021 3:31 PM     [facility] [address] [place] [address] [phone] (office) [phone] (fax)

## 2021-04-13 DIAGNOSIS — M170 Bilateral primary osteoarthritis of knee: Secondary | ICD-10-CM | POA: Diagnosis not present

## 2021-04-15 DIAGNOSIS — E039 Hypothyroidism, unspecified: Secondary | ICD-10-CM | POA: Diagnosis not present

## 2021-04-15 DIAGNOSIS — E785 Hyperlipidemia, unspecified: Secondary | ICD-10-CM | POA: Diagnosis not present

## 2021-04-20 DIAGNOSIS — M13861 Other specified arthritis, right knee: Secondary | ICD-10-CM | POA: Diagnosis not present

## 2021-04-20 DIAGNOSIS — M170 Bilateral primary osteoarthritis of knee: Secondary | ICD-10-CM | POA: Diagnosis not present

## 2021-04-20 DIAGNOSIS — M13862 Other specified arthritis, left knee: Secondary | ICD-10-CM | POA: Diagnosis not present

## 2021-04-27 DIAGNOSIS — M13862 Other specified arthritis, left knee: Secondary | ICD-10-CM | POA: Diagnosis not present

## 2021-04-27 DIAGNOSIS — M13861 Other specified arthritis, right knee: Secondary | ICD-10-CM | POA: Diagnosis not present

## 2021-04-27 DIAGNOSIS — M170 Bilateral primary osteoarthritis of knee: Secondary | ICD-10-CM | POA: Diagnosis not present

## 2021-05-03 DIAGNOSIS — R5383 Other fatigue: Secondary | ICD-10-CM | POA: Diagnosis not present

## 2021-05-03 DIAGNOSIS — F325 Major depressive disorder, single episode, in full remission: Secondary | ICD-10-CM | POA: Diagnosis not present

## 2021-05-03 DIAGNOSIS — I110 Hypertensive heart disease with heart failure: Secondary | ICD-10-CM | POA: Diagnosis not present

## 2021-05-03 DIAGNOSIS — D649 Anemia, unspecified: Secondary | ICD-10-CM | POA: Diagnosis not present

## 2021-05-03 DIAGNOSIS — R300 Dysuria: Secondary | ICD-10-CM | POA: Diagnosis not present

## 2021-05-03 DIAGNOSIS — E871 Hypo-osmolality and hyponatremia: Secondary | ICD-10-CM | POA: Diagnosis not present

## 2021-05-03 DIAGNOSIS — I480 Paroxysmal atrial fibrillation: Secondary | ICD-10-CM | POA: Diagnosis not present

## 2021-05-04 ENCOUNTER — Other Ambulatory Visit: Payer: Self-pay | Admitting: Nurse Practitioner

## 2021-05-04 ENCOUNTER — Other Ambulatory Visit: Payer: Self-pay | Admitting: Cardiology

## 2021-05-04 DIAGNOSIS — I4819 Other persistent atrial fibrillation: Secondary | ICD-10-CM

## 2021-05-05 NOTE — Telephone Encounter (Signed)
Eliquis 5mg  refill request received. Patient is 81 years old, weight-68.9kg, Crea-0.76 on 12/09/2020, Diagnosis-Afib, and last seen by Dr. Curt Bears on 03/23/2021. Dose is appropriate based on dosing criteria. Will send in refill to requested pharmacy.

## 2021-05-07 ENCOUNTER — Observation Stay (HOSPITAL_COMMUNITY): Payer: Medicare Other

## 2021-05-07 ENCOUNTER — Emergency Department (HOSPITAL_COMMUNITY): Payer: Medicare Other

## 2021-05-07 ENCOUNTER — Inpatient Hospital Stay (HOSPITAL_COMMUNITY)
Admission: EM | Admit: 2021-05-07 | Discharge: 2021-05-10 | DRG: 645 | Disposition: A | Discharge status: Home / Self Care | Payer: Medicare Other | Attending: Internal Medicine | Admitting: Internal Medicine

## 2021-05-07 ENCOUNTER — Encounter (HOSPITAL_COMMUNITY): Payer: Self-pay | Admitting: Emergency Medicine

## 2021-05-07 ENCOUNTER — Other Ambulatory Visit: Payer: Self-pay

## 2021-05-07 DIAGNOSIS — I110 Hypertensive heart disease with heart failure: Secondary | ICD-10-CM | POA: Diagnosis not present

## 2021-05-07 DIAGNOSIS — E876 Hypokalemia: Secondary | ICD-10-CM | POA: Diagnosis present

## 2021-05-07 DIAGNOSIS — Z888 Allergy status to other drugs, medicaments and biological substances status: Secondary | ICD-10-CM | POA: Diagnosis not present

## 2021-05-07 DIAGNOSIS — E222 Syndrome of inappropriate secretion of antidiuretic hormone: Principal | ICD-10-CM | POA: Diagnosis present

## 2021-05-07 DIAGNOSIS — E871 Hypo-osmolality and hyponatremia: Secondary | ICD-10-CM | POA: Diagnosis present

## 2021-05-07 DIAGNOSIS — Z7983 Long term (current) use of bisphosphonates: Secondary | ICD-10-CM

## 2021-05-07 DIAGNOSIS — I10 Essential (primary) hypertension: Secondary | ICD-10-CM | POA: Diagnosis present

## 2021-05-07 DIAGNOSIS — Z20822 Contact with and (suspected) exposure to covid-19: Secondary | ICD-10-CM | POA: Diagnosis present

## 2021-05-07 DIAGNOSIS — Z79899 Other long term (current) drug therapy: Secondary | ICD-10-CM | POA: Diagnosis not present

## 2021-05-07 DIAGNOSIS — K900 Celiac disease: Secondary | ICD-10-CM | POA: Diagnosis present

## 2021-05-07 DIAGNOSIS — F325 Major depressive disorder, single episode, in full remission: Secondary | ICD-10-CM | POA: Diagnosis not present

## 2021-05-07 DIAGNOSIS — R5383 Other fatigue: Secondary | ICD-10-CM | POA: Diagnosis not present

## 2021-05-07 DIAGNOSIS — E785 Hyperlipidemia, unspecified: Secondary | ICD-10-CM | POA: Diagnosis present

## 2021-05-07 DIAGNOSIS — Z7901 Long term (current) use of anticoagulants: Secondary | ICD-10-CM

## 2021-05-07 DIAGNOSIS — R410 Disorientation, unspecified: Secondary | ICD-10-CM | POA: Diagnosis not present

## 2021-05-07 DIAGNOSIS — E039 Hypothyroidism, unspecified: Secondary | ICD-10-CM | POA: Diagnosis present

## 2021-05-07 DIAGNOSIS — Z79890 Hormone replacement therapy (postmenopausal): Secondary | ICD-10-CM | POA: Diagnosis not present

## 2021-05-07 DIAGNOSIS — D649 Anemia, unspecified: Secondary | ICD-10-CM | POA: Diagnosis present

## 2021-05-07 DIAGNOSIS — R4182 Altered mental status, unspecified: Secondary | ICD-10-CM | POA: Diagnosis not present

## 2021-05-07 DIAGNOSIS — Z8249 Family history of ischemic heart disease and other diseases of the circulatory system: Secondary | ICD-10-CM

## 2021-05-07 DIAGNOSIS — G4733 Obstructive sleep apnea (adult) (pediatric): Secondary | ICD-10-CM | POA: Diagnosis present

## 2021-05-07 DIAGNOSIS — F419 Anxiety disorder, unspecified: Secondary | ICD-10-CM | POA: Diagnosis present

## 2021-05-07 DIAGNOSIS — I480 Paroxysmal atrial fibrillation: Secondary | ICD-10-CM | POA: Diagnosis present

## 2021-05-07 DIAGNOSIS — G4730 Sleep apnea, unspecified: Secondary | ICD-10-CM | POA: Diagnosis present

## 2021-05-07 DIAGNOSIS — K449 Diaphragmatic hernia without obstruction or gangrene: Secondary | ICD-10-CM | POA: Diagnosis not present

## 2021-05-07 LAB — BASIC METABOLIC PANEL
Anion gap: 7 (ref 5–15)
Anion gap: 6 (ref 5–15)
BUN: 9 mg/dL (ref 8–23)
BUN: 9 mg/dL (ref 8–23)
CO2: 26 mmol/L (ref 22–32)
CO2: 25 mmol/L (ref 22–32)
Calcium: 8.7 mg/dL — ABNORMAL LOW (ref 8.9–10.3)
Calcium: 8.5 mg/dL — ABNORMAL LOW (ref 8.9–10.3)
Chloride: 93 mmol/L — ABNORMAL LOW (ref 98–111)
Chloride: 93 mmol/L — ABNORMAL LOW (ref 98–111)
Creatinine, Ser: 0.42 mg/dL — ABNORMAL LOW (ref 0.44–1.00)
Creatinine, Ser: 0.48 mg/dL (ref 0.44–1.00)
GFR, Estimated: >60 mL/min (ref >60)
GFR, Estimated: >60 mL/min (ref >60)
Glucose, Bld: 115 mg/dL — ABNORMAL HIGH (ref 70–99)
Glucose, Bld: 120 mg/dL — ABNORMAL HIGH (ref 70–99)
Potassium: 3.5 mmol/L (ref 3.5–5.1)
Potassium: 3.4 mmol/L — ABNORMAL LOW (ref 3.5–5.1)
Sodium: 126 mmol/L — ABNORMAL LOW (ref 135–145)
Sodium: 124 mmol/L — ABNORMAL LOW (ref 135–145)

## 2021-05-07 LAB — RESP PANEL BY RT-PCR (FLU A&B, COVID) ARPGX2
Influenza A by PCR: NEGATIVE
Influenza B by PCR: NEGATIVE
SARS Coronavirus 2 by RT PCR: NEGATIVE

## 2021-05-07 LAB — CBC
HCT: 33.2 % — ABNORMAL LOW (ref 36.0–46.0)
Hemoglobin: 11.3 g/dL — ABNORMAL LOW (ref 12.0–15.0)
MCH: 31.3 pg (ref 26.0–34.0)
MCHC: 34 g/dL (ref 30.0–36.0)
MCV: 92 fL (ref 80.0–100.0)
Platelets: 354 10*3/uL (ref 150–400)
RBC: 3.61 MIL/uL — ABNORMAL LOW (ref 3.87–5.11)
RDW: 12.9 % (ref 11.5–15.5)
WBC: 10.7 10*3/uL — ABNORMAL HIGH (ref 4.0–10.5)
nRBC: 0 % (ref 0.0–0.2)

## 2021-05-07 LAB — LIPID PANEL
Cholesterol: 216 mg/dL — ABNORMAL HIGH (ref 0–200)
HDL: 89 mg/dL (ref >40)
LDL Cholesterol: 115 mg/dL — ABNORMAL HIGH (ref 0–99)
Total CHOL/HDL Ratio: 2.4 RATIO
Triglycerides: 61 mg/dL (ref <150)
VLDL: 12 mg/dL (ref 0–40)

## 2021-05-07 LAB — COMPREHENSIVE METABOLIC PANEL
ALT: 17 U/L (ref 0–44)
AST: 22 U/L (ref 15–41)
Albumin: 4.1 g/dL (ref 3.5–5.0)
Alkaline Phosphatase: 40 U/L (ref 38–126)
Anion gap: 9 (ref 5–15)
BUN: 13 mg/dL (ref 8–23)
CO2: 24 mmol/L (ref 22–32)
Calcium: 8.8 mg/dL — ABNORMAL LOW (ref 8.9–10.3)
Chloride: 87 mmol/L — ABNORMAL LOW (ref 98–111)
Creatinine, Ser: 0.45 mg/dL (ref 0.44–1.00)
GFR, Estimated: >60 mL/min (ref >60)
Glucose, Bld: 109 mg/dL — ABNORMAL HIGH (ref 70–99)
Potassium: 3.3 mmol/L — ABNORMAL LOW (ref 3.5–5.1)
Sodium: 120 mmol/L — ABNORMAL LOW (ref 135–145)
Total Bilirubin: 0.5 mg/dL (ref 0.3–1.2)
Total Protein: 6.9 g/dL (ref 6.5–8.1)

## 2021-05-07 LAB — URINALYSIS, ROUTINE W REFLEX MICROSCOPIC
Bilirubin Urine: NEGATIVE
Glucose, UA: NEGATIVE mg/dL
Hgb urine dipstick: NEGATIVE
Ketones, ur: NEGATIVE mg/dL
Leukocytes,Ua: NEGATIVE
Nitrite: NEGATIVE
Protein, ur: NEGATIVE mg/dL
Specific Gravity, Urine: 1.01 (ref 1.005–1.030)
pH: 7.5 (ref 5.0–8.0)

## 2021-05-07 LAB — OSMOLALITY: Osmolality: 268 mOsm/kg — ABNORMAL LOW (ref 275–295)

## 2021-05-07 LAB — MAGNESIUM: Magnesium: 2.1 mg/dL (ref 1.7–2.4)

## 2021-05-07 LAB — TSH: TSH: 3.278 u[IU]/mL (ref 0.350–4.500)

## 2021-05-07 LAB — OSMOLALITY, URINE: Osmolality, Ur: 233 mOsm/kg — ABNORMAL LOW (ref 300–900)

## 2021-05-07 LAB — SODIUM, URINE, RANDOM: Sodium, Ur: 38 mmol/L

## 2021-05-07 MED ORDER — AMLODIPINE BESYLATE 5 MG PO TABS
5.0000 mg | ORAL_TABLET | Freq: Every day | ORAL | Status: DC
  Administered 2021-05-07 – 2021-05-09 (×3): 5 mg via ORAL
  Filled 2021-05-07 (×3): qty 1

## 2021-05-07 MED ORDER — POTASSIUM CHLORIDE CRYS ER 20 MEQ PO TBCR
40.0000 meq | EXTENDED_RELEASE_TABLET | Freq: Once | ORAL | Status: AC
  Administered 2021-05-07: 40 meq via ORAL
  Filled 2021-05-07: qty 2

## 2021-05-07 MED ORDER — TRAMADOL HCL 50 MG PO TABS: 50.0000 mg | ORAL_TABLET | Freq: Four times a day (QID) | ORAL | Status: DC | PRN

## 2021-05-07 MED ORDER — HYDROCORTISONE (PERIANAL) 2.5 % EX CREA
1.0000 "application " | TOPICAL_CREAM | Freq: Two times a day (BID) | CUTANEOUS | Status: DC | PRN
  Filled 2021-05-07: qty 28.35

## 2021-05-07 MED ORDER — DORZOLAMIDE HCL-TIMOLOL MAL 2-0.5 % OP SOLN
1.0000 [drp] | Freq: Two times a day (BID) | OPHTHALMIC | Status: DC
  Administered 2021-05-07 – 2021-05-10 (×4): 1 [drp] via OPHTHALMIC
  Filled 2021-05-07: qty 10

## 2021-05-07 MED ORDER — HYDRALAZINE HCL 10 MG PO TABS: 10.0000 mg | ORAL_TABLET | Freq: Three times a day (TID) | ORAL | Status: DC | PRN

## 2021-05-07 MED ORDER — LEVOTHYROXINE SODIUM 88 MCG PO TABS
88.0000 ug | ORAL_TABLET | Freq: Every day | ORAL | Status: DC
  Administered 2021-05-08 – 2021-05-10 (×3): 88 ug via ORAL
  Filled 2021-05-07 (×3): qty 1

## 2021-05-07 MED ORDER — LOSARTAN POTASSIUM 25 MG PO TABS: 50.0000 mg | ORAL_TABLET | Freq: Every morning | ORAL | Status: DC

## 2021-05-07 MED ORDER — BRIMONIDINE TARTRATE 0.15 % OP SOLN
1.0000 [drp] | Freq: Two times a day (BID) | OPHTHALMIC | Status: DC
  Administered 2021-05-07 – 2021-05-10 (×4): 1 [drp] via OPHTHALMIC
  Filled 2021-05-07: qty 5

## 2021-05-07 MED ORDER — ACETAMINOPHEN 500 MG PO TABS: 1000.0000 mg | ORAL_TABLET | Freq: Two times a day (BID) | ORAL | Status: DC | PRN

## 2021-05-07 MED ORDER — POLYVINYL ALCOHOL 1.4 % OP SOLN
1.0000 [drp] | Freq: Every day | OPHTHALMIC | Status: DC | PRN
  Filled 2021-05-07: qty 15

## 2021-05-07 MED ORDER — ASCORBIC ACID 500 MG PO TABS
500.0000 mg | ORAL_TABLET | Freq: Every day | ORAL | Status: DC
  Administered 2021-05-08 – 2021-05-10 (×3): 500 mg via ORAL
  Filled 2021-05-07 (×3): qty 1

## 2021-05-07 MED ORDER — ROSUVASTATIN CALCIUM 5 MG PO TABS
5.0000 mg | ORAL_TABLET | Freq: Every day | ORAL | Status: DC
  Administered 2021-05-07 – 2021-05-09 (×3): 5 mg via ORAL
  Filled 2021-05-07 (×3): qty 1

## 2021-05-07 MED ORDER — SODIUM CHLORIDE 0.9 % IV BOLUS
1000.0000 mL | Freq: Once | INTRAVENOUS | Status: AC
  Administered 2021-05-07: 1000 mL via INTRAVENOUS

## 2021-05-07 MED ORDER — FLUOROMETHOLONE 0.1 % OP SUSP: 1.0000 [drp] | Freq: Every day | OPHTHALMIC | Status: DC

## 2021-05-07 MED ORDER — METOPROLOL SUCCINATE ER 25 MG PO TB24
25.0000 mg | ORAL_TABLET | Freq: Every day | ORAL | Status: DC
  Administered 2021-05-08 – 2021-05-10 (×3): 25 mg via ORAL
  Filled 2021-05-07 (×3): qty 1

## 2021-05-07 MED ORDER — APIXABAN 5 MG PO TABS
5.0000 mg | ORAL_TABLET | Freq: Two times a day (BID) | ORAL | Status: DC
  Administered 2021-05-07 – 2021-05-10 (×6): 5 mg via ORAL
  Filled 2021-05-07 (×6): qty 1

## 2021-05-07 MED ORDER — LORATADINE 10 MG PO TABS: 10.0000 mg | ORAL_TABLET | Freq: Every day | ORAL | Status: DC | PRN

## 2021-05-07 MED ORDER — CHOLECALCIFEROL 10 MCG (400 UNIT) PO TABS
400.0000 [IU] | ORAL_TABLET | Freq: Every day | ORAL | Status: DC
  Administered 2021-05-08 – 2021-05-10 (×3): 400 [IU] via ORAL
  Filled 2021-05-07 (×3): qty 1

## 2021-05-07 MED ORDER — METHOCARBAMOL 500 MG PO TABS: 500.0000 mg | ORAL_TABLET | Freq: Every day | ORAL | Status: DC | PRN

## 2021-05-07 MED ORDER — PANTOPRAZOLE SODIUM 40 MG PO TBEC
40.0000 mg | DELAYED_RELEASE_TABLET | Freq: Every day | ORAL | Status: DC
  Administered 2021-05-07 – 2021-05-09 (×3): 40 mg via ORAL
  Filled 2021-05-07 (×3): qty 1

## 2021-05-07 MED ORDER — ADULT MULTIVITAMIN W/MINERALS CH
1.0000 | ORAL_TABLET | Freq: Every day | ORAL | Status: DC
  Administered 2021-05-08 – 2021-05-10 (×3): 1 via ORAL
  Filled 2021-05-07 (×3): qty 1

## 2021-05-07 NOTE — ED Triage Notes (Signed)
Patient has experienced confusion and disorientation since Sunday. On Monday, at her MD's office, they found her sodium to be 118. She was sent home at told to increase sodium in her diet. Today her sodium was rechecked and found to be 118 again despite her best efforts to increase it.   Hx: AFIb

## 2021-05-07 NOTE — ED Provider Triage Note (Signed)
Emergency Medicine Provider Triage Evaluation Note  Susan Bell , a 81 y.o. female  was evaluated in triage.  Pt complains of hyponatremia.  Accompanied by daughter states that over the past 2 weeks patient has had intermittent confusion and disorientation.  She was seen by primary care on Monday and found to have sodium level of 118.  She states that they instructed her to eat lots of salty foods, Pedialyte, Gatorade which she has been doing all week to attempt improved sodium level.  She states that she had appointment today with sodium rechecked which was still 118 and she was instructed to present to the emergency department.  Denies any recent head trauma or falls.  Denies swelling.  Denies increased urination.  She does endorse increased thirst..  No history of diabetes or congestive heart failure.  Review of Systems  Positive: See above Negative:   Physical Exam  BP (!) 164/65 (BP Location: Left Arm)    Pulse (!) 59    Temp 98.2 F (36.8 C) (Oral)    Resp 18    Ht 5\' 4"  (1.626 m)    Wt 59.6 kg    SpO2 99%    BMI 22.55 kg/m  Gen:   Awake, no distress, oriented and alert Resp:  Normal effort, lungs clear bilaterally MSK:   Moves extremities without difficulty  Other:  No lower extremity edema.  Medical Decision Making  Medically screening exam initiated at 1:33 PM.  Appropriate orders placed.  Damika A Carrera was informed that the remainder of the evaluation will be completed by another provider, this initial triage assessment does not replace that evaluation, and the importance of remaining in the ED until their evaluation is complete.     Mickie Hillier, PA-C 05/07/21 1335

## 2021-05-07 NOTE — Progress Notes (Signed)
Per Dr. Posey Pronto: "I saw this patient's labs-sodium 126 up from 120 after saline bolus in the emergency room. I would leave her on fluid restriction overnight with every 4 hour sodium levels without additional intravenous fluids at this time. "  1) have ordered Q4H BMPs 2) have ordered 1287m PO fluid restriction with diet 3) have verified no further IVF is currently ordered at this time.

## 2021-05-07 NOTE — ED Provider Notes (Signed)
[facility] DEPT Provider Note   CSN: [id number] Arrival date & time: 05/07/21  1229     History  Chief Complaint  Patient presents with   Abnormal Lab    Susan Bell is a 81 y.o. female.  Pt is a 81 yo wf with a hx of htn, hypothyroidism, hyperlipidemia, anemia, celiac disease, afib on Eliquis, and osa.  Pt had some confusion last week.  She saw her pcp on Monday, 1/30.  She had labs done which showed a Na of 118.  The pt was told to go home and eat salty foods.  She has been doing that and had a recheck of labs today.  Na still 118.  She was told to come to the ED for further eval.  She is less confused than last week, but still feels very weak.  She has not been able to do much this week.  She has had a poor appetite.  No f/c.  No vomiting, but she has felt nauseous.  No pain.  No trauma.  She is not on diuretics.      Home Medications Prior to Admission medications   Medication Sig Start Date End Date Taking? Authorizing Provider  acetaminophen (TYLENOL) 500 MG tablet Take 1,000 mg by mouth 2 (two) times daily as needed for mild pain or moderate pain.   Yes [provider]  alendronate (FOSAMAX) 70 MG tablet Take 70 mg by mouth every 14 (fourteen) days. 11/24/14  Yes [provider]  amLODipine (NORVASC) 10 MG tablet Take 0.5 tablets (5 mg total) by mouth daily. Patient taking differently: Take 5 mg by mouth at bedtime. 02/18/21  Yes Camnitz, Ocie Doyne, MD  Ascorbic Acid (VITAMIN C) 500 MG CAPS Take 500 mg by mouth daily.   Yes [provider]  brimonidine (ALPHAGAN) 0.15 % ophthalmic solution Place 1 drop into the left eye in the morning and at bedtime. 07/12/20  Yes [provider]  Cholecalciferol (VITAMIN D3) 10 MCG (400 UNIT) CAPS Take 400 Units by mouth daily with breakfast.   Yes [provider]  dorzolamide-timolol (COSOPT) 22.3-6.8 MG/ML ophthalmic solution Place 1 drop into the left eye in  the morning and at bedtime. 06/05/19  Yes [provider]  ELIQUIS 5 MG TABS tablet TAKE 1 TABLET TWICE A DAY Patient taking differently: Take 5 mg by mouth in the morning and at bedtime. 05/05/21  Yes Camnitz, Will Hassell Done, MD  escitalopram (LEXAPRO) 10 MG tablet Take 5-10 mg by mouth See admin instructions. Take 5 mg by mouth once a day from 05/04/2021 through 05/11/2021. On 05/12/2021, increase the daily dosage to 10 mg.   Yes [provider]  fluorometholone (FML) 0.1 % ophthalmic suspension Place 1 drop into the left eye in the morning. 07/13/20  Yes [provider]  hydrocortisone (ANUSOL-HC) 2.5 % rectal cream Place 1 application rectally 2 (two) times daily as needed for hemorrhoids. 06/15/20  Yes [provider]  levothyroxine (SYNTHROID) 88 MCG tablet Take 88 mcg by mouth daily before breakfast.   Yes [provider]  loratadine (CLARITIN) 10 MG tablet Take 10 mg by mouth daily as needed for allergies or rhinitis.   Yes [provider]  losartan (COZAAR) 50 MG tablet TAKE 1 TABLET BY MOUTH EVERY DAY Patient taking differently: Take 50 mg by mouth in the morning. 05/04/21  Yes Hochrein, Jeneen Rinks, MD  MELATONIN GUMMIES PO Take 0.5 tablets by mouth at bedtime as needed (for sleep).  Yes [provider]  Menthol-Methyl Salicylate (SALONPAS PAIN RELIEF PATCH EX) Apply 1 patch topically daily as needed (for pain).   Yes [provider]  methocarbamol (ROBAXIN) 500 MG tablet Take 500 mg by mouth daily as needed for muscle spasms. 01/21/20  Yes [provider]  metoprolol succinate (TOPROL-XL) 50 MG 24 hr tablet Take 25 mg by mouth daily.   Yes [provider]  Multiple Vitamins-Minerals (ONE-A-DAY WOMENS 50+ ADVANTAGE) TABS Take 1 tablet by mouth daily with breakfast.   Yes [provider]  PATADAY 0.2 % SOLN Place 1 drop into both eyes daily as needed (allergies).  05/14/19  Yes [provider]   Pediatric Multivitamins-Iron (FLINTSTONES PLUS IRON PO) Take 1 tablet by mouth daily with breakfast.   Yes [provider]  PRILOSEC OTC 20 MG tablet Take 20 mg by mouth at bedtime.   Yes [provider]  rosuvastatin (CRESTOR) 5 MG tablet Take 5 mg by mouth at bedtime. 09/21/17  Yes [provider]  SYSTANE ULTRA 0.4-0.3 % SOLN Place 1 drop into both eyes daily as needed (dry eyes).  05/14/19  Yes [provider]  traMADol (ULTRAM) 50 MG tablet Take 50 mg by mouth every 6 (six) hours as needed for moderate pain. 03/11/20  Yes [provider]      Allergies    Atorvastatin, Tommas Olp [iron polysaccharide], and Povidone iodine    Review of Systems   Review of Systems  Neurological:  Positive for weakness.       Confusion  All other systems reviewed and are negative.  Physical Exam Updated Vital Signs BP (!) 189/66    Pulse 69    Temp 98.2 F (36.8 C) (Oral)    Resp 18    Ht 5' 4"  (1.626 m)    Wt 67.3 kg    SpO2 100%    BMI 25.48 kg/m  Physical Exam Vitals and nursing note reviewed.  Constitutional:      Appearance: Normal appearance.  HENT:     Head: Normocephalic and atraumatic.     Right Ear: External ear normal.     Left Ear: External ear normal.     Nose: Nose normal.     Mouth/Throat:     Mouth: Mucous membranes are moist.     Pharynx: Oropharynx is clear.  Eyes:     Extraocular Movements: Extraocular movements intact.     Conjunctiva/sclera: Conjunctivae normal.     Pupils: Pupils are equal, round, and reactive to light.  Cardiovascular:     Rate and Rhythm: Normal rate and regular rhythm.     Pulses: Normal pulses.     Heart sounds: Normal heart sounds.  Pulmonary:     Effort: Pulmonary effort is normal.     Breath sounds: Normal breath sounds.  Abdominal:     General: Abdomen is flat. Bowel sounds are normal.     Palpations: Abdomen is soft.  Musculoskeletal:        General: Normal range of motion.     Cervical back:  Normal range of motion and neck supple.  Skin:    General: Skin is warm.     Capillary Refill: Capillary refill takes less than 2 seconds.  Neurological:     General: No focal deficit present.     Mental Status: She is alert and oriented to person, place, and time.  Psychiatric:        Mood and Affect: Mood normal.  Behavior: Behavior normal.    ED Results / Procedures / Treatments   Labs (all labs ordered are listed, but only abnormal results are displayed) Labs Reviewed  COMPREHENSIVE METABOLIC PANEL - Abnormal; Notable for the following components:      Result Value   Sodium 120 (*)    Potassium 3.3 (*)    Chloride 87 (*)    Glucose, Bld 109 (*)    Calcium 8.8 (*)    All other components within normal limits  CBC - Abnormal; Notable for the following components:   WBC 10.7 (*)    RBC 3.61 (*)    Hemoglobin 11.3 (*)    HCT 33.2 (*)    All other components within normal limits  RESP PANEL BY RT-PCR (FLU A&B, COVID) ARPGX2  URINALYSIS, ROUTINE W REFLEX MICROSCOPIC  SODIUM, URINE, RANDOM  TSH  MAGNESIUM  LIPID PANEL    EKG EKG Interpretation  Date/Time:  Friday May 07 2021 14:45:50 EST Ventricular Rate:  65 PR Interval:  211 QRS Duration: 152 QT Interval:  435 QTC Calculation: 453 R Axis:   -37 Text Interpretation: Sinus rhythm Nonspecific IVCD with LAD LVH with secondary repolarization abnormality No significant change since last tracing Confirmed by Isla Pence [phone]) on 05/07/2021 3:18:50 PM  Radiology DG Chest 2 View  Result Date: 05/07/2021 CLINICAL DATA:  Hyponatremia.  Confusion. EXAM: CHEST - 2 VIEW COMPARISON:  Radiograph 10/26/2017, cardiac CT 09/14/2020 FINDINGS: The heart is normal in size. Unchanged mediastinal contours with aortic atherosclerosis. Hiatal hernia on prior CT not well seen by radiograph. No pulmonary edema, focal airspace disease, pleural effusion or pneumothorax. No evident pulmonary mass. No acute osseous findings are  seen. IMPRESSION: No acute chest findings. Electronically Signed   By: Keith Rake M.D.   On: 05/07/2021 15:54    Procedures Procedures    Medications Ordered in ED Medications  potassium chloride SA (KLOR-CON M) CR tablet 40 mEq (has no administration in time range)  sodium chloride 0.9 % bolus 1,000 mL (1,000 mLs Intravenous New Bag/Given 05/07/21 1525)    ED Course/ Medical Decision Making/ A&P                           Medical Decision Making Amount and/or Complexity of Data Reviewed Labs: ordered. Radiology: ordered.   Pt's sodium has not gone up much despite trying to increase salt intake at home.  Pt is not post op, nor has she recently received IVIG or IV mannitor.  She is not jaundiced, no hx plasma cell dyscrasia.  Specimen was not lipemic, but a lipid panel has been added on.  Her urine Na is nl.  No evidence of fluid overload and she is not on diuretics.  BP is  nl.  TSH is nl.  Cause of hyponatremia is unclear.  She may need an am cortisol and ACTH stimulation test.  K is low, so pt is given 40 meq po Kdur.  Mg ordered.  Hgb of 11.3 is chronic for patient.  Pt remains symptomatic, so she is d/w Dr. Erlinda Hong (triad) for admission.  CRITICAL CARE Performed by: Isla Pence   Total critical care time: 30 minutes  Critical care time was exclusive of separately billable procedures and treating other patients.  Critical care was necessary to treat or prevent imminent or life-threatening deterioration.  Critical care was time spent personally by me on the following activities: development of treatment plan with patient and/or surrogate as  well as nursing, discussions with consultants, evaluation of patient's response to treatment, examination of patient, obtaining history from patient or surrogate, ordering and performing treatments and interventions, ordering and review of laboratory studies, ordering and review of radiographic studies, pulse oximetry and re-evaluation  of patient's condition.         Final Clinical Impression(s) / ED Diagnoses Final diagnoses:  Hyponatremia  Hypokalemia  Chronic anemia    Rx / DC Orders ED Discharge Orders     None         Isla Pence, MD 05/07/21 1731

## 2021-05-07 NOTE — H&P (Addendum)
History and Physical    Susan Bell CMK:[id number] DOB: 1940-04-08 DOA: 05/07/2021  PCP: Shon Baton, MD Patient coming from: Home, independent of ADL,   I have personally briefly reviewed patient's old medical records in [place]  Chief Complaint: Hyponatremia, confusion  HPI: Susan Bell is a 81 y.o. female with medical history significant of celiac disease and gluten free diet ,OSA on cPAP, HTN, A-fib on Eliquis has been having confusion for a week, reportedly sodium was checked at PCPs office was 118, was told to increase salt intake, on recheck sodium was 120 , she is advised to come to the ED. she reported was irritable and anxious a few days ago, this symptom has improved after she started on Lexapro, currently she report feeling weak , she stopped driving a week ago due to weakness . she denies headache, denies confusion.  Denies  vomiting or diarrhea, does not take diuretics. No edema. No polydipsia.   ED Course: Blood pressure (!) 186/72, pulse 71, temperature 98.2 F (36.8 C), temperature source Oral, resp. rate 14, height 5' 4"  (1.626 m), weight 67.3 kg, SpO2 99 %. UA unremarkable, urine specific gravity 1.01, urine sodium 38, WBC 10.7, hemoglobin 11.3, sodium 120, potassium 3.3, creatinine 0.45, LFT unremarkable, she received 1 L of normal saline, hospitalist called to further manage hyponatremia.  Review of Systems: As per HPI otherwise all other systems reviewed and are negative.   Past Medical History:  Diagnosis Date   Allergy    seasonal   Anemia    Arthritis    Cataract    bilateral removed   Celiac disease    Eczema    H/O transfusion of platelets    Hyperlipidemia    Hypothyroidism    OSA (obstructive sleep apnea)    Osteopenia    Paroxysmal atrial fibrillation (HCC)    Shingles    Vitamin D deficiency     Past Surgical History:  Procedure Laterality Date   ATRIAL FIBRILLATION ABLATION N/A 09/16/2020   Procedure: ATRIAL FIBRILLATION  ABLATION;  Surgeon: Constance Haw, MD;  Location: [facility];  Service: Cardiovascular;  Laterality: N/A;   CARDIOVERSION N/A 12/14/2017   Procedure: CARDIOVERSION;  Surgeon: Sanda Klein, MD;  Location: [facility] ENDOSCOPY;  Service: Cardiovascular;  Laterality: N/A;   CARDIOVERSION N/A 11/07/2019   Procedure: CARDIOVERSION;  Surgeon: Jerline Pain, MD;  Location: [facility] ENDOSCOPY;  Service: Cardiovascular;  Laterality: N/A;   CARDIOVERSION N/A 03/24/2020   Procedure: CARDIOVERSION;  Surgeon: [name], MD;  Location: [facility];  Service: Cardiovascular;  Laterality: N/A;   CARDIOVERSION N/A 05/11/2020   Procedure: CARDIOVERSION;  Surgeon: Fay Records, MD;  Location[facility] ENDOSCOPY;  Service: Cardiovascular;  Laterality: N/A;   CATARACT EXTRACTION     bilateral   COLONOSCOPY     DILATION AND CURETTAGE OF UTERUS     UPPER GI ENDOSCOPY      Social History  reports that she has never smoked. She has never used smokeless tobacco. She reports that she does not drink alcohol and does not use drugs.  Allergies  Allergen Reactions   Atorvastatin Other (See Comments)    Knee pain   Ferrex [Iron Polysaccharide] Other (See Comments)    Severe constipation    Povidone Iodine Itching and Other (See Comments)    With prolonged usage, itching    Family History  Problem Relation Age of Onset   Liver cancer Mother    Diabetes Father  Heart disease Father    Colon cancer Neg Hx     Prior to Admission medications   Medication Sig Start Date End Date Taking? Authorizing Provider  acetaminophen (TYLENOL) 500 MG tablet Take 1,000 mg by mouth 2 (two) times daily as needed for mild pain or moderate pain.   Yes [provider]  alendronate (FOSAMAX) 70 MG tablet Take 70 mg by mouth every 14 (fourteen) days. 11/24/14  Yes [provider]  amLODipine (NORVASC) 10 MG tablet Take 0.5 tablets (5 mg total) by mouth daily. Patient taking differently: Take 5 mg by mouth  at bedtime. 02/18/21  Yes Camnitz, Ocie Doyne, MD  Ascorbic Acid (VITAMIN C) 500 MG CAPS Take 500 mg by mouth daily.   Yes [provider]  brimonidine (ALPHAGAN) 0.15 % ophthalmic solution Place 1 drop into the left eye in the morning and at bedtime. 07/12/20  Yes [provider]  Cholecalciferol (VITAMIN D3) 10 MCG (400 UNIT) CAPS Take 400 Units by mouth daily with breakfast.   Yes [provider]  dorzolamide-timolol (COSOPT) 22.3-6.8 MG/ML ophthalmic solution Place 1 drop into the left eye in the morning and at bedtime. 06/05/19  Yes [provider]  ELIQUIS 5 MG TABS tablet TAKE 1 TABLET TWICE A DAY Patient taking differently: Take 5 mg by mouth in the morning and at bedtime. 05/05/21  Yes Camnitz, Will Hassell Done, MD  escitalopram (LEXAPRO) 10 MG tablet Take 5-10 mg by mouth See admin instructions. Take 5 mg by mouth once a day from 05/04/2021 through 05/11/2021. On 05/12/2021, increase the daily dosage to 10 mg.   Yes [provider]  fluorometholone (FML) 0.1 % ophthalmic suspension Place 1 drop into the left eye in the morning. 07/13/20  Yes [provider]  hydrocortisone (ANUSOL-HC) 2.5 % rectal cream Place 1 application rectally 2 (two) times daily as needed for hemorrhoids. 06/15/20  Yes [provider]  levothyroxine (SYNTHROID) 88 MCG tablet Take 88 mcg by mouth daily before breakfast.   Yes [provider]  loratadine (CLARITIN) 10 MG tablet Take 10 mg by mouth daily as needed for allergies or rhinitis.   Yes [provider]  losartan (COZAAR) 50 MG tablet TAKE 1 TABLET BY MOUTH EVERY DAY Patient taking differently: Take 50 mg by mouth in the morning. 05/04/21  Yes Hochrein, Jeneen Rinks, MD  MELATONIN GUMMIES PO Take 0.5 tablets by mouth at bedtime as needed (for sleep).   Yes [provider]  Menthol-Methyl Salicylate (SALONPAS PAIN RELIEF PATCH EX) Apply 1 patch topically daily as needed (for pain).   Yes  [provider]  methocarbamol (ROBAXIN) 500 MG tablet Take 500 mg by mouth daily as needed for muscle spasms. 01/21/20  Yes [provider]  metoprolol succinate (TOPROL-XL) 50 MG 24 hr tablet Take 25 mg by mouth daily.   Yes [provider]  Multiple Vitamins-Minerals (ONE-A-DAY WOMENS 50+ ADVANTAGE) TABS Take 1 tablet by mouth daily with breakfast.   Yes [provider]  PATADAY 0.2 % SOLN Place 1 drop into both eyes daily as needed (allergies).  05/14/19  Yes [provider]  Pediatric Multivitamins-Iron (FLINTSTONES PLUS IRON PO) Take 1 tablet by mouth daily with breakfast.   Yes [provider]  PRILOSEC OTC 20 MG tablet Take 20 mg by mouth at bedtime.   Yes [provider]  rosuvastatin (CRESTOR) 5 MG tablet Take 5 mg by mouth at bedtime. 09/21/17  Yes [provider]  SYSTANE ULTRA 0.4-0.3 %  SOLN Place 1 drop into both eyes daily as needed (dry eyes).  05/14/19  Yes [provider]  traMADol (ULTRAM) 50 MG tablet Take 50 mg by mouth every 6 (six) hours as needed for moderate pain. 03/11/20  Yes [provider]    Physical Exam: Vitals:   05/07/21 1500 05/07/21 1530 05/07/21 1600 05/07/21 1630  BP: (!) 182/59 (!) 185/65 (!) 191/61 (!) 189/66  Pulse: 61 63 69 69  Resp: 15 20 18 18   Temp:      TempSrc:      SpO2: 99% 99% 100% 100%  Weight:      Height:        Constitutional: NAD, calm, comfortable, AAOx3, very pleasant Eyes: PERRL, lids and conjunctivae normal ENMT: Mucous membranes are moist.  Respiratory: clear to auscultation bilaterally, no wheezing, no crackles. Normal respiratory effort. No accessory muscle use.  Cardiovascular: Regular rate and rhythm,  No extremity edema. 2+ pedal pulses. No carotid bruits.  Abdomen: no tenderness, not distended, Bowel sounds positive.  Musculoskeletal: no clubbing / cyanosis. No joint deformity upper and lower extremities. Good ROM, no contractures. Normal  muscle tone.  Skin: no rashes, lesions, ulcers. No induration Neurologic: CN 2-12 grossly intact. Sensation intact, Strength 5/5 in all 4.  Psychiatric: Normal judgment and insight. Alert and oriented x 3. Normal mood.    Labs on Admission: I have personally reviewed following labs and imaging studies  CBC: Recent Labs  Lab 05/07/21 1345  WBC 10.7*  HGB 11.3*  HCT 33.2*  MCV 92.0  PLT 161    Basic Metabolic Panel: Recent Labs  Lab 05/07/21 1345  NA 120*  K 3.3*  CL 87*  CO2 24  GLUCOSE 109*  BUN 13  CREATININE 0.45  CALCIUM 8.8*    GFR: Estimated Creatinine Clearance: 52 mL/min (by C-G formula based on SCr of 0.45 mg/dL).  Liver Function Tests: Recent Labs  Lab 05/07/21 1345  AST 22  ALT 17  ALKPHOS 40  BILITOT 0.5  PROT 6.9  ALBUMIN 4.1    Urine analysis:    Component Value Date/Time   COLORURINE YELLOW 05/07/2021 [place] 05/07/2021 1445   LABSPEC 1.010 05/07/2021 1445   PHURINE 7.5 05/07/2021 1445   GLUCOSEU NEGATIVE 05/07/2021 1445   HGBUR NEGATIVE 05/07/2021 1445   BILIRUBINUR NEGATIVE 05/07/2021 1445   KETONESUR NEGATIVE 05/07/2021 1445   PROTEINUR NEGATIVE 05/07/2021 1445   UROBILINOGEN 0.2 04/15/2010 1315   NITRITE NEGATIVE 05/07/2021 1445   LEUKOCYTESUR NEGATIVE 05/07/2021 1445    Radiological Exams on Admission: DG Chest 2 View  Result Date: 05/07/2021 CLINICAL DATA:  Hyponatremia.  Confusion. EXAM: CHEST - 2 VIEW COMPARISON:  Radiograph 10/26/2017, cardiac CT 09/14/2020 FINDINGS: The heart is normal in size. Unchanged mediastinal contours with aortic atherosclerosis. Hiatal hernia on prior CT not well seen by radiograph. No pulmonary edema, focal airspace disease, pleural effusion or pneumothorax. No evident pulmonary mass. No acute osseous findings are seen. IMPRESSION: No acute chest findings. Electronically Signed   By: Keith Rake M.D.   On: 05/07/2021 15:54    EKG: Independently reviewed.    Assessment/Plan Principal Problem:   Hyponatremia    Chronic hyponatremia (>48hr), mild symptom, euvolemic -Sodium 120 on presentation, urine sodium checked in the ED was 38 -Case discussed with nephrology Dr. Posey Pronto who recommend check stat urine osmo, serum osmolality, repeat stat BMP -Dr. Posey Pronto will decide on rate of correction after labs resulted, goal of correction less than 8 mEq per  24 hours -Dr. Posey Pronto recommend hold Lexapro and losartan for now  Hypokalemia Replace K, recheck in a.m., check mag  Hypertension Continue metoprolol, Norvasc,  hold Cozaar in the setting of hyponatremia Start as needed hydralazine for systolic BP greater than 749  Paroxysmal A-fib -History of cardioversion -In sinus rhythm -Continue beta-blocker and Eliquis  Hyperlipidemia Continue statin  OSA continue CPAP nightly  DVT prophylaxis:   On Eliquis   Code Status:   Full Family Communication:  Daughter at bedside  Patient is from: Home    Anticipated DC to: Home   Anticipated DC date: 24-48 hours    Consults called:  Dr. Posey Pronto Admission status:  Observation    Voice Recognition Viviann Spare dictation system was used to create this note, attempts have been made to correct errors. Please contact the author with questions and/or clarifications.  Florencia Reasons MD PhD FACP [facility]  How to contact the [facility] Attending or Consulting provider [place] or covering provider during after hours [place], for this patient?   Check the care team in [facility] and look for a) attending/consulting [facility] provider listed and b) the [facility] team listed Log into [email or web address] and use [place]'s universal password to access. If you do not have the password, please contact the hospital operator. Locate the [facility] provider you are looking for under [facility] and page to a number that you can be directly reached. If you still have difficulty reaching the provider, please page the [facility] (Director on Call) for the  Hospitalists listed on amion for assistance.  05/07/2021, 5:26 PM

## 2021-05-08 DIAGNOSIS — E871 Hypo-osmolality and hyponatremia: Secondary | ICD-10-CM | POA: Diagnosis not present

## 2021-05-08 DIAGNOSIS — Z888 Allergy status to other drugs, medicaments and biological substances status: Secondary | ICD-10-CM | POA: Diagnosis not present

## 2021-05-08 DIAGNOSIS — E785 Hyperlipidemia, unspecified: Secondary | ICD-10-CM | POA: Diagnosis present

## 2021-05-08 DIAGNOSIS — Z20822 Contact with and (suspected) exposure to covid-19: Secondary | ICD-10-CM | POA: Diagnosis present

## 2021-05-08 DIAGNOSIS — Z79890 Hormone replacement therapy (postmenopausal): Secondary | ICD-10-CM | POA: Diagnosis not present

## 2021-05-08 DIAGNOSIS — Z79899 Other long term (current) drug therapy: Secondary | ICD-10-CM | POA: Diagnosis not present

## 2021-05-08 DIAGNOSIS — Z8249 Family history of ischemic heart disease and other diseases of the circulatory system: Secondary | ICD-10-CM | POA: Diagnosis not present

## 2021-05-08 DIAGNOSIS — D649 Anemia, unspecified: Secondary | ICD-10-CM | POA: Diagnosis present

## 2021-05-08 DIAGNOSIS — E222 Syndrome of inappropriate secretion of antidiuretic hormone: Secondary | ICD-10-CM | POA: Diagnosis present

## 2021-05-08 DIAGNOSIS — I480 Paroxysmal atrial fibrillation: Secondary | ICD-10-CM | POA: Diagnosis present

## 2021-05-08 DIAGNOSIS — E039 Hypothyroidism, unspecified: Secondary | ICD-10-CM | POA: Diagnosis present

## 2021-05-08 DIAGNOSIS — K900 Celiac disease: Secondary | ICD-10-CM | POA: Diagnosis present

## 2021-05-08 DIAGNOSIS — Z7983 Long term (current) use of bisphosphonates: Secondary | ICD-10-CM | POA: Diagnosis not present

## 2021-05-08 DIAGNOSIS — I10 Essential (primary) hypertension: Secondary | ICD-10-CM | POA: Diagnosis present

## 2021-05-08 DIAGNOSIS — Z7901 Long term (current) use of anticoagulants: Secondary | ICD-10-CM | POA: Diagnosis not present

## 2021-05-08 DIAGNOSIS — F419 Anxiety disorder, unspecified: Secondary | ICD-10-CM | POA: Diagnosis present

## 2021-05-08 DIAGNOSIS — E876 Hypokalemia: Secondary | ICD-10-CM | POA: Diagnosis present

## 2021-05-08 DIAGNOSIS — G4733 Obstructive sleep apnea (adult) (pediatric): Secondary | ICD-10-CM | POA: Diagnosis present

## 2021-05-08 LAB — BASIC METABOLIC PANEL
Anion gap: 8 (ref 5–15)
Anion gap: 6 (ref 5–15)
Anion gap: 8 (ref 5–15)
BUN: 9 mg/dL (ref 8–23)
BUN: 9 mg/dL (ref 8–23)
BUN: 8 mg/dL (ref 8–23)
CO2: 23 mmol/L (ref 22–32)
CO2: 25 mmol/L (ref 22–32)
CO2: 22 mmol/L (ref 22–32)
Calcium: 8.3 mg/dL — ABNORMAL LOW (ref 8.9–10.3)
Calcium: 8.6 mg/dL — ABNORMAL LOW (ref 8.9–10.3)
Calcium: 8.7 mg/dL — ABNORMAL LOW (ref 8.9–10.3)
Chloride: 91 mmol/L — ABNORMAL LOW (ref 98–111)
Chloride: 94 mmol/L — ABNORMAL LOW (ref 98–111)
Chloride: 94 mmol/L — ABNORMAL LOW (ref 98–111)
Creatinine, Ser: 0.55 mg/dL (ref 0.44–1.00)
Creatinine, Ser: 0.54 mg/dL (ref 0.44–1.00)
Creatinine, Ser: 0.56 mg/dL (ref 0.44–1.00)
GFR, Estimated: >60 mL/min (ref >60)
GFR, Estimated: >60 mL/min (ref >60)
GFR, Estimated: >60 mL/min (ref >60)
Glucose, Bld: 99 mg/dL (ref 70–99)
Glucose, Bld: 100 mg/dL — ABNORMAL HIGH (ref 70–99)
Glucose, Bld: 115 mg/dL — ABNORMAL HIGH (ref 70–99)
Potassium: 3.4 mmol/L — ABNORMAL LOW (ref 3.5–5.1)
Potassium: 3.4 mmol/L — ABNORMAL LOW (ref 3.5–5.1)
Potassium: 3.5 mmol/L (ref 3.5–5.1)
Sodium: 122 mmol/L — ABNORMAL LOW (ref 135–145)
Sodium: 125 mmol/L — ABNORMAL LOW (ref 135–145)
Sodium: 124 mmol/L — ABNORMAL LOW (ref 135–145)

## 2021-05-08 LAB — CBC
HCT: 34.4 % — ABNORMAL LOW (ref 36.0–46.0)
Hemoglobin: 11.4 g/dL — ABNORMAL LOW (ref 12.0–15.0)
MCH: 30.7 pg (ref 26.0–34.0)
MCHC: 33.1 g/dL (ref 30.0–36.0)
MCV: 92.7 fL (ref 80.0–100.0)
Platelets: 339 10*3/uL (ref 150–400)
RBC: 3.71 MIL/uL — ABNORMAL LOW (ref 3.87–5.11)
RDW: 12.9 % (ref 11.5–15.5)
WBC: 8.1 10*3/uL (ref 4.0–10.5)
nRBC: 0 % (ref 0.0–0.2)

## 2021-05-08 LAB — SODIUM
Sodium: 123 mmol/L — ABNORMAL LOW (ref 135–145)
Sodium: 124 mmol/L — ABNORMAL LOW (ref 135–145)
Sodium: 127 mmol/L — ABNORMAL LOW (ref 135–145)

## 2021-05-08 LAB — MAGNESIUM: Magnesium: 2.1 mg/dL (ref 1.7–2.4)

## 2021-05-08 LAB — CORTISOL: Cortisol, Plasma: 10.5 ug/dL

## 2021-05-08 LAB — URIC ACID: Uric Acid, Serum: 2.1 mg/dL — ABNORMAL LOW (ref 2.5–7.1)

## 2021-05-08 MED ORDER — ENSURE ENLIVE PO LIQD
237.0000 mL | Freq: Two times a day (BID) | ORAL | Status: DC
  Administered 2021-05-08 – 2021-05-10 (×4): 237 mL via ORAL

## 2021-05-08 MED ORDER — SODIUM CHLORIDE 3 % IV SOLN
INTRAVENOUS | Status: DC
  Filled 2021-05-08 (×3): qty 500

## 2021-05-08 MED ORDER — POTASSIUM CHLORIDE CRYS ER 20 MEQ PO TBCR
40.0000 meq | EXTENDED_RELEASE_TABLET | Freq: Once | ORAL | Status: AC
  Administered 2021-05-08: 40 meq via ORAL
  Filled 2021-05-08: qty 2

## 2021-05-08 MED ORDER — POLYETHYLENE GLYCOL 3350 17 G PO PACK
17.0000 g | PACK | Freq: Every day | ORAL | Status: DC
  Administered 2021-05-10: 17 g via ORAL
  Filled 2021-05-08: qty 1

## 2021-05-08 NOTE — Progress Notes (Signed)
Pt home CPAP unit inspected at bedside for frayed cords/damages, none were found.  Pt's CPAP machine is working properly and pt will place on when ready for bed.

## 2021-05-08 NOTE — Assessment & Plan Note (Signed)
Continue CPAP nightly. °

## 2021-05-08 NOTE — Assessment & Plan Note (Signed)
History of cardioversion -In sinus rhythm -Continue beta-blocker and Eliquis

## 2021-05-08 NOTE — Consult Note (Addendum)
Reason for Consult: Hyponatremia-acute on chronic Referring Physician: Florencia Reasons, MD  HPI:  81 year old woman with past medical history significant for celiac disease, hypothyroidism, atrial fibrillation on anticoagulation with Eliquis, obstructive sleep apnea on CPAP, anxiety and hypertension.  She appears to have a normal renal function at baseline with a creatinine ranging 0.5-0.6 and baseline sodium level ranging 128-132.  She presented to her primary care provider's office last week with concerns of increasing confusion and labs revealed a sodium level of 118 for which she was asked to increase her salt intake with subsequent improvement of sodium level to 120 this week prompting referral to the emergency room.  She was started on Lexapro for increased anxiety and irritability symptoms about a week ago (although she has some confusion about the timing of this).  She reports some preceding nausea without any vomiting or diarrhea.  She denies any dysuria, urgency, frequency, flank pain, fever or chills.  She denies any chest pain or shortness of breath and has not had any worsening lower extremity edema.  She received a liter bolus of normal saline in the emergency room with sodium level rising up to 126 from 120 following which we monitored her overnight on fluid restrictions and started her on 3% saline this morning as sodium level had dropped down back to 122.  Urine osmolality is elevated at 233, serum osmolality is low at 268 and TSH/cortisol are unremarkable.  CT scan of the brain unremarkable for acute lesion.  She denies any alcohol use.  Past Medical History:  Diagnosis Date   Allergy    seasonal   Anemia    Arthritis    Cataract    bilateral removed   Celiac disease    Eczema    H/O transfusion of platelets    Hyperlipidemia    Hypothyroidism    OSA (obstructive sleep apnea)    Osteopenia    Paroxysmal atrial fibrillation (HCC)    Shingles    Vitamin D deficiency     Past  Surgical History:  Procedure Laterality Date   ATRIAL FIBRILLATION ABLATION N/A 09/16/2020   Procedure: ATRIAL FIBRILLATION ABLATION;  Surgeon: Constance Haw, MD;  Location: [facility];  Service: Cardiovascular;  Laterality: N/A;   CARDIOVERSION N/A 12/14/2017   Procedure: CARDIOVERSION;  Surgeon: Sanda Klein, MD;  Location: [facility];  Service: Cardiovascular;  Laterality: N/A;   CARDIOVERSION N/A 11/07/2019   Procedure: CARDIOVERSION;  Surgeon: Jerline Pain, MD;  Location: [facility] ENDOSCOPY;  Service: Cardiovascular;  Laterality: N/A;   CARDIOVERSION N/A 03/24/2020   Procedure: CARDIOVERSION;  Surgeon: [name], MD;  Location: [facility];  Service: Cardiovascular;  Laterality: N/A;   CARDIOVERSION N/A 05/11/2020   Procedure: CARDIOVERSION;  Surgeon: Fay Records, MD;  Location: [facility] ENDOSCOPY;  Service: Cardiovascular;  Laterality: N/A;   CATARACT EXTRACTION     bilateral   COLONOSCOPY     DILATION AND CURETTAGE OF UTERUS     UPPER GI ENDOSCOPY      Family History  Problem Relation Age of Onset   Liver cancer Mother    Diabetes Father    Heart disease Father    Colon cancer Neg Hx     Social History:  reports that she has never smoked. She has never used smokeless tobacco. She reports that she does not drink alcohol and does not use drugs.  Allergies:  Allergies  Allergen Reactions   Atorvastatin Other (See Comments)    Knee pain   Ferrex [  Iron Polysaccharide] Other (See Comments)    Severe constipation    Povidone Iodine Itching and Other (See Comments)    With prolonged usage, itching    Medications: I have reviewed the patient's current medications. Scheduled:  amLODipine  5 mg Oral QHS   apixaban  5 mg Oral BID   ascorbic acid  500 mg Oral Daily   brimonidine  1 drop Left Eye BID   cholecalciferol  400 Units Oral Q breakfast   dorzolamide-timolol  1 drop Left Eye BID   feeding supplement  237 mL Oral BID BM   fluorometholone  1 drop  Left Eye QHS   levothyroxine  88 mcg Oral QAC breakfast   metoprolol succinate  25 mg Oral Daily   multivitamin with minerals  1 tablet Oral Daily   pantoprazole  40 mg Oral QHS   polyethylene glycol  17 g Oral Daily   rosuvastatin  5 mg Oral QHS   BMP Latest Ref Rng & Units 05/08/2021 05/08/2021 05/08/2021  Glucose 70 - 99 mg/dL - 100(H) 99  BUN 8 - 23 mg/dL - 9 9  Creatinine 0.44 - 1.00 mg/dL - 0.54 0.55  BUN/Creat Ratio 12 - 28 - - -  Sodium 135 - 145 mmol/L 123(L) 125(L) 122(L)  Potassium 3.5 - 5.1 mmol/L - 3.4(L) 3.4(L)  Chloride 98 - 111 mmol/L - 94(L) 91(L)  CO2 22 - 32 mmol/L - 25 23  Calcium 8.9 - 10.3 mg/dL - 8.6(L) 8.3(L)   CBC Latest Ref Rng & Units 05/08/2021 05/07/2021 09/03/2020  WBC 4.0 - 10.5 K/uL 8.1 10.7(H) 11.0(H)  Hemoglobin 12.0 - 15.0 g/dL 11.4(L) 11.3(L) 10.6(L)  Hematocrit 36.0 - 46.0 % 34.4(L) 33.2(L) 31.2(L)  Platelets 150 - 400 K/uL 339 354 385     DG Chest 2 View  Result Date: 05/07/2021 CLINICAL DATA:  Hyponatremia.  Confusion. EXAM: CHEST - 2 VIEW COMPARISON:  Radiograph 10/26/2017, cardiac CT 09/14/2020 FINDINGS: The heart is normal in size. Unchanged mediastinal contours with aortic atherosclerosis. Hiatal hernia on prior CT not well seen by radiograph. No pulmonary edema, focal airspace disease, pleural effusion or pneumothorax. No evident pulmonary mass. No acute osseous findings are seen. IMPRESSION: No acute chest findings. Electronically Signed   By: Keith Rake M.D.   On: 05/07/2021 15:54   CT HEAD WO CONTRAST (5MM)  Result Date: 05/07/2021 CLINICAL DATA:  Mental status change, unknown cause EXAM: CT HEAD WITHOUT CONTRAST TECHNIQUE: Contiguous axial images were obtained from the base of the skull through the vertex without intravenous contrast. RADIATION DOSE REDUCTION: This exam was performed according to the departmental dose-optimization program which includes automated exposure control, adjustment of the mA and/or kV according to patient size and/or  use of iterative reconstruction technique. 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 No acute intracranial abnormality. Specifically, no hemorrhage, hydrocephalus, mass lesion, acute infarction, or significant intracranial injury. Vascular: No hyperdense vessel or unexpected calcification. Skull: No acute calvarial abnormality. 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 No acute intracranial abnormality. Electronically Signed   By: Rolm Baptise M.D.   On: 05/07/2021 19:13    Review of Systems  Constitutional:  Positive for activity change, appetite change and fatigue.  HENT:  Negative for sinus pain, sore throat and trouble swallowing.   Eyes:  Negative for photophobia and visual disturbance.  Respiratory:  Negative for chest tightness and shortness of breath.   Cardiovascular:  Negative for chest pain and leg swelling.  Gastrointestinal:  Positive for nausea. Negative for diarrhea and vomiting.  Genitourinary:  Negative for difficulty urinating, dysuria, frequency, hematuria and urgency.  Musculoskeletal:  Negative for back pain, joint swelling and myalgias.  Skin:  Negative for pallor and wound.  Neurological:  Negative for dizziness, light-headedness and headaches.  Psychiatric/Behavioral:  Positive for confusion. The patient is nervous/anxious.   Blood pressure (!) 184/64, pulse 64, temperature 98 F (36.7 C), temperature source Oral, resp. rate 16, height 5\' 4"  (1.626 m), weight 67.3 kg, SpO2 96 %. Physical Exam Vitals and nursing note reviewed.  Constitutional:      Appearance: Normal appearance. She is normal weight. She is not ill-appearing or toxic-appearing.  HENT:     Head: Normocephalic and atraumatic.     Right Ear: External ear normal.     Left Ear: External ear normal.     Nose: Nose normal.     Mouth/Throat:     Mouth: Mucous membranes  are dry.     Pharynx: Oropharynx is clear.  Eyes:     Extraocular Movements: Extraocular movements intact.     Conjunctiva/sclera: Conjunctivae normal.  Cardiovascular:     Rate and Rhythm: Normal rate and regular rhythm.     Pulses: Normal pulses.     Heart sounds: Normal heart sounds. No murmur heard. Pulmonary:     Effort: Pulmonary effort is normal.     Breath sounds: Normal breath sounds. No wheezing or rales.  Abdominal:     General: Abdomen is flat. Bowel sounds are normal.     Palpations: Abdomen is soft.     Tenderness: There is no abdominal tenderness.  Musculoskeletal:     Cervical back: Normal range of motion and neck supple.     Right lower leg: No edema.     Left lower leg: No edema.  Skin:    General: Skin is warm and dry.     Coloration: Skin is not pale.  Neurological:     Mental Status: She is alert. She is disoriented.     Comments: Exam difficulty with orientation to time/timeline of events.  Oriented well to person and place  Psychiatric:        Mood and Affect: Mood normal.    Assessment/Plan: 1.  Hyponatremia: Acute on chronic.  Euvolemic hypoosmolar hyponatremia.  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  She is symptomatic with confusion and we will treat her with hypertonic 3% saline and serial monitoring of sodium levels to avoid overcorrection/rapid rise given that this is acute on chronic.  Will hold ARB at this time to help with correction of hyponatremia and continue oral fluid restriction.  I suspect that there might be an element of muscle wasting/poor solute intake especially with the decrease of her creatinine from ranging 0.9-1.0 now to 0.4-0.6 over the past year.  We will start her on nutritional supplement. 2.  Hypokalemia: She is surprisingly not on any potassium wasting medications and hypokalemia is an unusual finding in this patient without history  of chronic alcoholism/diarrhea.  Will start on potassium supplement that should help improve serum osmolality/symptoms of hyponatremia. 3.  Hypertension: Hold ARB at this time with efforts at trying to correct hyponatremia and can resume her amlodipine and metoprolol at this time. 4.  Paroxysmal atrial fibrillation: Appears to be rate controlled and currently in sinus rhythm, resume Eliquis for CVA prophylaxis.  Susan Venard K. 05/08/2021, 2:26 PM

## 2021-05-08 NOTE — Hospital Course (Signed)
celiac disease and gluten free diet ,OSA on cPAP, HTN, A-fib on Eliquis presented with acute on chronic symptomatic hyponatremia, nephrology consulted

## 2021-05-08 NOTE — Progress Notes (Signed)
°  Progress Note   Patient: Susan Bell UUE:[id number] DOB: 08/16/1940 DOA: 05/07/2021     0 DOS: the patient was seen and examined on 05/08/2021   Brief hospital course:  celiac disease and gluten free diet ,OSA on cPAP, HTN, A-fib on Eliquis presented with acute on chronic symptomatic hyponatremia, nephrology consulted  Assessment and Plan: * Hyponatremia- (present on admission) Acute on chronic, sodium 128 December 2022, sodium 118 a few days ago at PCP office, sodium 120 on presentation Labs consistent with SIADH, hold Lexapro  Nephrology consulted, she is started on hypertonic saline, management per nephrology, appreciate nephrology input  Hypokalemia, replaced, improved  Normocytic anemia Hemoglobin at baseline  Paroxysmal atrial fibrillation ([place])- (present on admission) History of cardioversion -In sinus rhythm -Continue beta-blocker and Eliquis  Essential hypertension- (present on admission) Hold ARB in the setting of hyponatremia, plan to resume ARB once hyponatremia corrected Continue Norvasc, metoprolol And as needed hydralazine   Sleep apnea- (present on admission) Continue CPAP nightly   Subjective: Slightly confused, denies headache, family at bedside  Physical Exam: Vitals:   05/08/21 0600 05/08/21 0700 05/08/21 1345 05/08/21 1743  BP: 137/61 (!) 142/54 (!) 184/64 (!) 145/48  Pulse: (!) 59 61 64 70  Resp: 14 14 16    Temp:   98 F (36.7 C)   TempSrc:   Oral   SpO2: 95% 98% 96% 97%  Weight:      Height:       General:  NAD, appears younger than stated age, very pleasant Cardiovascular: RRR Respiratory: CTABL Abdomen: Soft/ND/NT, positive BS Musculoskeletal: No Edema Neuro: alert, oriented x3 but slightly slow in answering questions   Data Reviewed: As mentioned in assessment and plan    Family Communication: Son at bedside  Disposition: Home after correction of hyponatremia, previously independent in ADLs Status is:  Inpatient     Author: Florencia Reasons, MD PhD FACP 05/08/2021 6:41 PM  For on call review [email or web address].

## 2021-05-08 NOTE — Assessment & Plan Note (Signed)
Acute on chronic, sodium 128 December 2022, sodium 118 a few days ago at PCP office, sodium 120 on presentation Labs consistent with SIADH, hold Lexapro  Nephrology consulted, she is started on hypertonic saline, management per nephrology, appreciate nephrology input

## 2021-05-08 NOTE — Assessment & Plan Note (Signed)
Hold ARB in the setting of hyponatremia, plan to resume ARB once hyponatremia corrected Continue Norvasc, metoprolol And as needed hydralazine

## 2021-05-09 DIAGNOSIS — E871 Hypo-osmolality and hyponatremia: Secondary | ICD-10-CM | POA: Diagnosis not present

## 2021-05-09 LAB — BASIC METABOLIC PANEL
Anion gap: 9 (ref 5–15)
Anion gap: 6 (ref 5–15)
BUN: 12 mg/dL (ref 8–23)
BUN: 17 mg/dL (ref 8–23)
CO2: 22 mmol/L (ref 22–32)
CO2: 25 mmol/L (ref 22–32)
Calcium: 8.5 mg/dL — ABNORMAL LOW (ref 8.9–10.3)
Calcium: 8.9 mg/dL (ref 8.9–10.3)
Chloride: 98 mmol/L (ref 98–111)
Chloride: 98 mmol/L (ref 98–111)
Creatinine, Ser: 0.54 mg/dL (ref 0.44–1.00)
Creatinine, Ser: 0.53 mg/dL (ref 0.44–1.00)
GFR, Estimated: >60 mL/min (ref >60)
GFR, Estimated: >60 mL/min (ref >60)
Glucose, Bld: 102 mg/dL — ABNORMAL HIGH (ref 70–99)
Glucose, Bld: 103 mg/dL — ABNORMAL HIGH (ref 70–99)
Potassium: 3.7 mmol/L (ref 3.5–5.1)
Potassium: 4 mmol/L (ref 3.5–5.1)
Sodium: 129 mmol/L — ABNORMAL LOW (ref 135–145)
Sodium: 129 mmol/L — ABNORMAL LOW (ref 135–145)

## 2021-05-09 LAB — SODIUM
Sodium: 130 mmol/L — ABNORMAL LOW (ref 135–145)
Sodium: 131 mmol/L — ABNORMAL LOW (ref 135–145)

## 2021-05-09 MED ORDER — POTASSIUM CHLORIDE CRYS ER 20 MEQ PO TBCR
20.0000 meq | EXTENDED_RELEASE_TABLET | Freq: Two times a day (BID) | ORAL | Status: AC
  Administered 2021-05-09 (×2): 20 meq via ORAL
  Filled 2021-05-09 (×2): qty 1

## 2021-05-09 MED ORDER — LOSARTAN POTASSIUM 50 MG PO TABS
50.0000 mg | ORAL_TABLET | Freq: Every day | ORAL | Status: DC
  Administered 2021-05-09 – 2021-05-10 (×2): 50 mg via ORAL
  Filled 2021-05-09 (×2): qty 1

## 2021-05-09 NOTE — Progress Notes (Signed)
Patient has home CPAP machine and will place on when she is ready.

## 2021-05-09 NOTE — Progress Notes (Signed)
Patient ID: Susan Bell, female   DOB: [birth year], 81 y.o.   MRN: [id number] [facility] Progress Note   Assessment/ Plan:   1.  Hyponatremia: Acute on chronic (baseline seems 128-132).  Euvolemic hypoosmolar hyponatremia.  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 Treated with 3% saline with improvement of sodium level today- now off 3% saline and with mental status back to baseline. Monitor x 24hr off IVFs and consider DC home if stable overnight. 2.  Hypokalemia: Likely from poor oral intake- supplemented orally and will give additional KDur today with diet education.  3.  Hypertension: Hyponatremia corrected and can resume her losartan at this time. 4.  Paroxysmal atrial fibrillation: Appears to be rate controlled and currently in sinus rhythm, resume Eliquis for CVA prophylaxis.  Subjective:   Reports to be feeling much better this morning "like a new person"   Objective:   BP (!) 139/51 (BP Location: Right Arm)    Pulse 64    Temp 98.2 F (36.8 C) (Oral)    Resp 18    Ht 5\' 4"  (1.626 m)    Wt 67.3 kg    SpO2 99%    BMI 25.48 kg/m   Intake/Output Summary (Last 24 hours) at 05/09/2021 1125 Last data filed at 05/09/2021 1030 Gross per 24 hour  Intake 1433.18 ml  Output --  Net 1433.18 ml   Weight change:   Physical Exam: WUJ:[id number] resting in bed, daughter and son at bedside AOZ:HYQMV regular rhythm and normal rate, normal s1 and s2 Resp:Clear to auscultation bilaterally, no rales/rhonchi HQI:ONGE, flat, non-tender Ext:No LE edema  Imaging: DG Chest 2 View  Result Date: 05/07/2021 CLINICAL DATA:  Hyponatremia.  Confusion. EXAM: CHEST - 2 VIEW COMPARISON:  Radiograph 10/26/2017, cardiac CT 09/14/2020 FINDINGS: The heart is normal in size. Unchanged mediastinal contours with aortic atherosclerosis. Hiatal hernia on prior CT not well seen by radiograph. No  pulmonary edema, focal airspace disease, pleural effusion or pneumothorax. No evident pulmonary mass. No acute osseous findings are seen. IMPRESSION: No acute chest findings. Electronically Signed   By: Keith Rake M.D.   On: 05/07/2021 15:54   CT HEAD WO CONTRAST (5MM)  Result Date: 05/07/2021 CLINICAL DATA:  Mental status change, unknown cause EXAM: CT HEAD WITHOUT CONTRAST TECHNIQUE: Contiguous axial images were obtained from the base of the skull through the vertex without intravenous contrast. RADIATION DOSE REDUCTION: This exam was performed according to the departmental dose-optimization program which includes automated exposure control, adjustment of the mA and/or kV according to patient size and/or use of iterative reconstruction technique. 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 No acute intracranial abnormality. Specifically, no hemorrhage, hydrocephalus, mass lesion, acute infarction, or significant intracranial injury. Vascular: No hyperdense vessel or unexpected calcification. Skull: No acute calvarial abnormality. 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 No acute intracranial abnormality. Electronically Signed   By: Rolm Baptise M.D.   On: 05/07/2021 19:13    Labs: BMET Recent Labs  Lab 05/07/21 1345 05/07/21 1934 05/07/21 2237 05/08/21 0309 05/08/21 0731 05/08/21 0735 05/08/21 1615 05/08/21 2236 05/09/21 0245 05/09/21 0637 05/09/21 1020  NA 120* 126* 124* 122* 125* 123* 124*   124* 127* 129* 130* 131*  K 3.3* 3.5 3.4* 3.4* 3.4*  --  3.5  --  3.7  --   --  CL 87* 93* 93* 91* 94*  --  94*  --  98  --   --   CO2 24 26 25 23 25   --  22  --  22  --   --   GLUCOSE 109* 115* 120* 99 100*  --  115*  --  102*  --   --   BUN 13 9 9 9 9   --  8  --  12  --   --   CREATININE 0.45 0.42* 0.48 0.55 0.54  --  0.56  --  0.54  --   --   CALCIUM  8.8* 8.7* 8.5* 8.3* 8.6*  --  8.7*  --  8.5*  --   --    CBC Recent Labs  Lab 05/07/21 1345 05/08/21 0305  WBC 10.7* 8.1  HGB 11.3* 11.4*  HCT 33.2* 34.4*  MCV 92.0 92.7  PLT 354 339    Medications:     amLODipine  5 mg Oral QHS   apixaban  5 mg Oral BID   ascorbic acid  500 mg Oral Daily   brimonidine  1 drop Left Eye BID   cholecalciferol  400 Units Oral Q breakfast   dorzolamide-timolol  1 drop Left Eye BID   feeding supplement  237 mL Oral BID BM   fluorometholone  1 drop Left Eye QHS   levothyroxine  88 mcg Oral QAC breakfast   metoprolol succinate  25 mg Oral Daily   multivitamin with minerals  1 tablet Oral Daily   pantoprazole  40 mg Oral QHS   polyethylene glycol  17 g Oral Daily   rosuvastatin  5 mg Oral QHS   Elmarie Shiley, MD 05/09/2021, 11:25 AM

## 2021-05-09 NOTE — Progress Notes (Signed)
°  Progress Note   Patient: Susan Bell DOB: [birth year] DOA: 05/07/2021     1 DOS: the patient was seen and examined on 05/09/2021   Brief hospital course:  celiac disease and gluten free diet ,OSA on cPAP, HTN, A-fib on Eliquis presented with acute on chronic symptomatic hyponatremia, nephrology consulted  Assessment and Plan: * Hyponatremia- (present on admission) -Acute on chronic, sodium 128 in December 2022, sodium 118 a few days ago at PCP office, sodium 120 on presentation L-abs consistent with SIADH, hold Lexapro  -Nephrology consulted, treated with hypertonic saline, improving -management per nephrology, appreciate nephrology input  Hypokalemia, replaced, improved  Normocytic anemia Hemoglobin at baseline  Paroxysmal atrial fibrillation ([place])- (present on admission) History of cardioversion -In sinus rhythm -Continue beta-blocker and Eliquis  Essential hypertension- (present on admission)  ARB  held in the setting of hyponatremia,   Resumed, now that sodium has improved Continue Norvasc, metoprolol And as needed hydralazine   Sleep apnea- (present on admission) Continue CPAP nightly   Subjective: Feeling better , family at bedside   Physical Exam: Vitals:   05/08/21 2040 05/08/21 2309 05/09/21 0412 05/09/21 1447  BP: (!) 183/60 (!) 159/54 (!) 139/51 (!) 142/52  Pulse: 71 65 64 65  Resp: 20 18 18 16   Temp: 98.1 F (36.7 C) 98.1 F (36.7 C) 98.2 F (36.8 C) 97.9 F (36.6 C)  TempSrc: Oral Oral Oral Oral  SpO2: 99% 98% 99% 99%  Weight:      Height:       General:  NAD, appears younger than stated age, very pleasant Cardiovascular: RRR Respiratory: CTABL Abdomen: Soft/ND/NT, positive BS Musculoskeletal: No Edema Neuro: alert, oriented x3    Data Reviewed: As mentioned in assessment and plan    Family Communication: Son at bedside  Disposition: Home on Monday if sodium stable and cleared by nephrology,  She is from home  ,previously independent in ADLs Status is: Inpatient     Author: Florencia Reasons, MD PhD FACP 05/09/2021 3:52 PM  For on call review [email or web address].

## 2021-05-10 DIAGNOSIS — E871 Hypo-osmolality and hyponatremia: Secondary | ICD-10-CM | POA: Diagnosis not present

## 2021-05-10 LAB — BASIC METABOLIC PANEL
Anion gap: 6 (ref 5–15)
BUN: 17 mg/dL (ref 8–23)
CO2: 25 mmol/L (ref 22–32)
Calcium: 8.9 mg/dL (ref 8.9–10.3)
Chloride: 96 mmol/L — ABNORMAL LOW (ref 98–111)
Creatinine, Ser: 0.56 mg/dL (ref 0.44–1.00)
GFR, Estimated: >60 mL/min (ref >60)
Glucose, Bld: 88 mg/dL (ref 70–99)
Potassium: 4.2 mmol/L (ref 3.5–5.1)
Sodium: 127 mmol/L — ABNORMAL LOW (ref 135–145)

## 2021-05-10 LAB — RENAL FUNCTION PANEL
Albumin: 3.8 g/dL (ref 3.5–5.0)
Anion gap: 5 (ref 5–15)
BUN: 17 mg/dL (ref 8–23)
CO2: 23 mmol/L (ref 22–32)
Calcium: 8.8 mg/dL — ABNORMAL LOW (ref 8.9–10.3)
Chloride: 98 mmol/L (ref 98–111)
Creatinine, Ser: 0.5 mg/dL (ref 0.44–1.00)
GFR, Estimated: >60 mL/min (ref >60)
Glucose, Bld: 95 mg/dL (ref 70–99)
Phosphorus: 3.6 mg/dL (ref 2.5–4.6)
Potassium: 5 mmol/L (ref 3.5–5.1)
Sodium: 126 mmol/L — ABNORMAL LOW (ref 135–145)

## 2021-05-10 MED ORDER — UREA 15 G PO PACK
15.0000 g | PACK | Freq: Two times a day (BID) | ORAL | Status: DC
  Administered 2021-05-10: 15 g via ORAL
  Filled 2021-05-10: qty 1

## 2021-05-10 MED ORDER — UREA 15 G PO PACK: 15.0000 g | PACK | Freq: Two times a day (BID) | ORAL | 0 refills | Status: AC

## 2021-05-10 NOTE — Discharge Summary (Signed)
Physician Discharge Summary  Susan Bell OIN:[id number] DOB: 1940-11-08 DOA: 05/07/2021  PCP: Shon Baton, MD  Admit date: 05/07/2021 Discharge date: 05/10/2021  Admitted From: home Discharge disposition: home   Recommendations for Outpatient Follow-Up:   BMP re: Na in 7-10 days BP check- -may need adjustment of medications    Discharge Diagnosis:   Principal Problem:   Hyponatremia Active Problems:   Celiac disease   Sleep apnea   Essential hypertension   Paroxysmal atrial fibrillation ([place])    Discharge Condition: Improved.  Diet recommendation: fluid restriction  Wound care: None.  Code status: Full.   History of Present Illness:   Susan Bell is a 81 y.o. female with medical history significant of celiac disease and gluten free diet ,OSA on cPAP, HTN, A-fib on Eliquis has been having confusion for a week, reportedly sodium was checked at PCPs office was 118, was told to increase salt intake, on recheck sodium was 120 , she is advised to come to the ED. she reported was irritable and anxious a few days ago, this symptom has improved after she started on Lexapro, currently she report feeling weak , she stopped driving a week ago due to weakness . she denies headache, denies confusion.  Denies  vomiting or diarrhea, does not take diuretics. No edema. No polydipsia.   Hospital Course by Problem:   Hyponatremia- (present on admission) -Acute on chronic, sodium 128 in December 2022, sodium 118 a few days ago at PCP office, sodium 120 on presentation L-abs consistent with SIADH, hold Lexapro  -Nephrology consulted, treated with hypertonic saline- discussed with renal-- would recommend a trial of urea BID with repeat of Na in 7-10 days   Hypokalemia, replaced, improved   Normocytic anemia Hemoglobin at baseline   Paroxysmal atrial fibrillation ([place])- (present on admission) History of cardioversion -In sinus rhythm -Continue beta-blocker and  Eliquis   Essential hypertension- (present on admission)  ARB  held in the setting of hyponatremia,   Resumed, now that sodium has improved Continue Norvasc, metoprolol -adjust as an outpatient      Sleep apnea- (present on admission) Continue CPAP nightly      Medical Consultants:   renal   Discharge Exam:   Vitals:   05/10/21 0400 05/10/21 1307  BP: (!) 160/77 (!) 170/57  Pulse: 61 67  Resp:  15  Temp: 97.8 F (36.6 C) 97.7 F (36.5 C)  SpO2: 99% 100%   Vitals:   05/09/21 1447 05/09/21 2052 05/10/21 0400 05/10/21 1307  BP: (!) 142/52 (!) 152/53 (!) 160/77 (!) 170/57  Pulse: 65 66 61 67  Resp: 16 17  15   Temp: 97.9 F (36.6 C) 97.8 F (36.6 C) 97.8 F (36.6 C) 97.7 F (36.5 C)  TempSrc: Oral Oral Axillary Oral  SpO2: 99% 97% 99% 100%  Weight:      Height:        General exam: Appears calm and comfortable.    The results of significant diagnostics from this hospitalization (including imaging, microbiology, ancillary and laboratory) are listed below for reference.     Procedures and Diagnostic Studies:   DG Chest 2 View  Result Date: 05/07/2021 CLINICAL DATA:  Hyponatremia.  Confusion. EXAM: CHEST - 2 VIEW COMPARISON:  Radiograph 10/26/2017, cardiac CT 09/14/2020 FINDINGS: The heart is normal in size. Unchanged mediastinal contours with aortic atherosclerosis. Hiatal hernia on prior CT not well seen by radiograph. No pulmonary edema, focal airspace disease, pleural effusion or pneumothorax. No evident  pulmonary mass. No acute osseous findings are seen. IMPRESSION: No acute chest findings. Electronically Signed   By: Keith Rake M.D.   On: 05/07/2021 15:54   CT HEAD WO CONTRAST (5MM)  Result Date: 05/07/2021 CLINICAL DATA:  Mental status change, unknown cause EXAM: CT HEAD WITHOUT CONTRAST TECHNIQUE: Contiguous axial images were obtained from the base of the skull through the vertex without intravenous contrast. RADIATION DOSE REDUCTION: This exam was  performed according to the departmental dose-optimization program which includes automated exposure control, adjustment of the mA and/or kV according to patient size and/or use of iterative reconstruction technique. 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 No acute intracranial abnormality. Specifically, no hemorrhage, hydrocephalus, mass lesion, acute infarction, or significant intracranial injury. Vascular: No hyperdense vessel or unexpected calcification. Skull: No acute calvarial abnormality. 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 No acute intracranial abnormality. Electronically Signed   By: Rolm Baptise M.D.   On: 05/07/2021 19:13     Labs:   Basic Metabolic Panel: Recent Labs  Lab 05/07/21 1345 05/07/21 1934 05/08/21 0305 05/08/21 0309 05/08/21 1615 05/08/21 2236 05/09/21 0245 05/09/21 0637 05/09/21 1020 05/09/21 1826 05/10/21 0533 05/10/21 1120  NA 120*   < >  --    < > 124*   124*   < > 129* 130* 131* 129* 126* 127*  K 3.3*   < >  --    < > 3.5  --  3.7  --   --  4.0 5.0 4.2  CL 87*   < >  --    < > 94*  --  98  --   --  98 98 96*  CO2 24   < >  --    < > 22  --  22  --   --  25 23 25   GLUCOSE 109*   < >  --    < > 115*  --  102*  --   --  103* 95 88  BUN 13   < >  --    < > 8  --  12  --   --  17 17 17   CREATININE 0.45   < >  --    < > 0.56  --  0.54  --   --  0.53 0.50 0.56  CALCIUM 8.8*   < >  --    < > 8.7*  --  8.5*  --   --  8.9 8.8* 8.9  MG 2.1  --  2.1  --   --   --   --   --   --   --   --   --   PHOS  --   --   --   --   --   --   --   --   --   --  3.6  --    < > = values in this interval not displayed.   GFR Estimated Creatinine Clearance: 52 mL/min (by C-G formula based on SCr of 0.56 mg/dL). Liver Function Tests: Recent Labs  Lab 05/07/21 1345 05/10/21 0533  AST 22  --   ALT 17  --   ALKPHOS 40  --   BILITOT  0.5  --   PROT 6.9  --   ALBUMIN 4.1 3.8   No results for input(s): LIPASE, AMYLASE in the last 168 hours. No  results for input(s): AMMONIA in the last 168 hours. Coagulation profile No results for input(s): INR, PROTIME in the last 168 hours.  CBC: Recent Labs  Lab 05/07/21 1345 05/08/21 0305  WBC 10.7* 8.1  HGB 11.3* 11.4*  HCT 33.2* 34.4*  MCV 92.0 92.7  PLT 354 339   Cardiac Enzymes: No results for input(s): CKTOTAL, CKMB, CKMBINDEX, TROPONINI in the last 168 hours. BNP: Invalid input(s): POCBNP CBG: No results for input(s): GLUCAP in the last 168 hours. D-Dimer No results for input(s): DDIMER in the last 72 hours. Hgb A1c No results for input(s): HGBA1C in the last 72 hours. Lipid Profile Recent Labs    05/07/21 1345  CHOL 216*  HDL 89  LDLCALC 115*  TRIG 61  CHOLHDL 2.4   Thyroid function studies Recent Labs    05/07/21 1345  TSH 3.278   Anemia work up No results for input(s): VITAMINB12, FOLATE, FERRITIN, TIBC, IRON, RETICCTPCT in the last 72 hours. Microbiology Recent Results (from the past 240 hour(s))  Resp Panel by RT-PCR (Flu A&B, Covid) Nasopharyngeal Swab     Status: None   Collection Time: 05/07/21  3:28 PM   Specimen: Nasopharyngeal Swab; Nasopharyngeal(NP) swabs in vial transport medium  Result Value Ref Range Status   SARS Coronavirus 2 by RT PCR NEGATIVE NEGATIVE Final    Comment: (NOTE) SARS-CoV-2 target nucleic acids are NOT DETECTED.  The SARS-CoV-2 RNA is generally detectable in upper respiratory specimens during the acute phase of infection. The lowest concentration of SARS-CoV-2 viral copies this assay can detect is 138 copies/mL. A negative result does not preclude SARS-Cov-2 infection and should not be used as the sole basis for treatment or other patient management decisions. A negative result may occur with  improper specimen collection/handling, submission of specimen other than nasopharyngeal swab, presence of viral  mutation(s) within the areas targeted by this assay, and inadequate number of viral copies(<138 copies/mL). A negative result must be combined with clinical observations, patient history, and epidemiological information. The expected result is Negative.  Fact Sheet for Patients:  EntrepreneurPulse.com.au  Fact Sheet for Healthcare Providers:  IncredibleEmployment.be  This test is no t yet approved or cleared by the [place] FDA and  has been authorized for detection and/or diagnosis of SARS-CoV-2 by FDA under an Emergency Use Authorization (EUA). This EUA will remain  in effect (meaning this test can be used) for the duration of the COVID-19 declaration under Section 564(b)(1) of the Act, 21 U.S.C.section 360bbb-3(b)(1), unless the authorization is terminated  or revoked sooner.       Influenza A by PCR NEGATIVE NEGATIVE Final   Influenza B by PCR NEGATIVE NEGATIVE Final    Comment: (NOTE) The Xpert Xpress SARS-CoV-2/FLU/RSV plus assay is intended as an aid in the diagnosis of influenza from Nasopharyngeal swab specimens and should not be used as a sole basis for treatment. Nasal washings and aspirates are unacceptable for Xpert Xpress SARS-CoV-2/FLU/RSV testing.  Fact Sheet for Patients: EntrepreneurPulse.com.au  Fact Sheet for Healthcare Providers: IncredibleEmployment.be  This test is not yet approved or cleared by the [place] FDA and has been authorized for detection and/or diagnosis of SARS-CoV-2 by FDA under an Emergency Use Authorization (EUA). This EUA will remain in effect (meaning this test can be used) for the duration of the COVID-19 declaration under Section 564(b)(1) of the Act, 21 U.S.C. section 360bbb-3(b)(1), unless the authorization is terminated or revoked.  Performed at [facility], [place] [address], [place], [place] [address]  Discharge  Instructions:   Discharge Instructions     Discharge instructions   Complete by: As directed    BMP 7-10 days Fluid restrict 10 1229m/day   Increase activity slowly   Complete by: As directed       Allergies as of 05/10/2021       Reactions   Atorvastatin Other (See Comments)   Knee pain   Ferrex [iron Polysaccharide] Other (See Comments)   Severe constipation    Povidone Iodine Itching, Other (See Comments)   With prolonged usage, itching        Medication List     STOP taking these medications    escitalopram 10 MG tablet Commonly known as: LEXAPRO   FLINTSTONES PLUS IRON PO       TAKE these medications    acetaminophen 500 MG tablet Commonly known as: TYLENOL Take 1,000 mg by mouth 2 (two) times daily as needed for mild pain or moderate pain.   alendronate 70 MG tablet Commonly known as: FOSAMAX Take 70 mg by mouth every 14 (fourteen) days.   amLODipine 10 MG tablet Commonly known as: NORVASC Take 0.5 tablets (5 mg total) by mouth daily. What changed: when to take this   brimonidine 0.15 % ophthalmic solution Commonly known as: ALPHAGAN Place 1 drop into the left eye in the morning and at bedtime.   dorzolamide-timolol 22.3-6.8 MG/ML ophthalmic solution Commonly known as: COSOPT Place 1 drop into the left eye in the morning and at bedtime.   Eliquis 5 MG Tabs tablet Generic drug: apixaban TAKE 1 TABLET TWICE A DAY What changed:  how much to take when to take this   fluorometholone 0.1 % ophthalmic suspension Commonly known as: FML Place 1 drop into the left eye in the morning.   hydrocortisone 2.5 % rectal cream Commonly known as: ANUSOL-HC Place 1 application rectally 2 (two) times daily as needed for hemorrhoids.   levothyroxine 88 MCG tablet Commonly known as: SYNTHROID Take 88 mcg by mouth daily before breakfast.   loratadine 10 MG tablet Commonly known as: CLARITIN Take 10 mg by mouth daily as needed for allergies or  rhinitis.   losartan 50 MG tablet Commonly known as: COZAAR TAKE 1 TABLET BY MOUTH EVERY DAY What changed: when to take this   MELATONIN GUMMIES PO Take 0.5 tablets by mouth at bedtime as needed (for sleep).   methocarbamol 500 MG tablet Commonly known as: ROBAXIN Take 500 mg by mouth daily as needed for muscle spasms.   metoprolol succinate 50 MG 24 hr tablet Commonly known as: TOPROL-XL Take 25 mg by mouth daily.   One-A-Day Womens 50+ Advantage Tabs Take 1 tablet by mouth daily with breakfast.   Pataday 0.2 % Soln Generic drug: Olopatadine HCl Place 1 drop into both eyes daily as needed (allergies).   PriLOSEC OTC 20 MG tablet Generic drug: omeprazole Take 20 mg by mouth at bedtime.   rosuvastatin 5 MG tablet Commonly known as: CRESTOR Take 5 mg by mouth at bedtime.   SALONPAS PAIN RELIEF PATCH EX Apply 1 patch topically daily as needed (for pain).   Systane Ultra 0.4-0.3 % Soln Generic drug: Polyethyl Glycol-Propyl Glycol Place 1 drop into both eyes daily as needed (dry eyes).   traMADol 50 MG tablet Commonly known as: ULTRAM Take 50 mg by mouth every 6 (six) hours as needed for moderate pain.   urea 15 g Pack oral packet Commonly known as: URE-NA Take 15 g by mouth 2 (two) times  daily.   Vitamin C 500 MG Caps Take 500 mg by mouth daily.   Vitamin D3 10 MCG (400 UNIT) Caps Take 400 Units by mouth daily with breakfast.          Time coordinating discharge: 35 min  Signed:  Geradine Girt DO  [facility] 05/10/2021, 1:25 PM

## 2021-05-10 NOTE — Progress Notes (Signed)
Patient ID: Susan Bell, female   DOB: 1940/08/31, 81 y.o.   MRN: [id number] [facility] Progress Note   Assessment/ Plan:   1.  Hyponatremia: Acute on chronic (baseline seems 128-132).  Euvolemic hypoosmolar hyponatremia.  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 Treated with 3% saline with improvement of sodium level today- now off 3% saline and with mental status back to baseline. Na today is 127, pt feeling much better. Her chronic baseline is 128- 132. OK for DC home. Recommend give her 2 wks of po urea 15gm bid (similar to salt tabs) and have her f/u with her PCP for labs. Have d/w pmd, will sign off.  2.  Hypokalemia: Likely from poor oral intake- supplemented orally and will give additional KDur today with diet education.  3.  Hypertension: Hyponatremia corrected and can resume her losartan at this time. 4.  Paroxysmal atrial fibrillation: Appears to be rate controlled and currently in sinus rhythm, resume Eliquis for CVA prophylaxis.  Kelly Splinter, MD 05/10/2021, 6:51 PM     Subjective:   Feeling very good   Objective:   BP (!) 170/57 (BP Location: Right Arm)    Pulse 67    Temp 97.7 F (36.5 C) (Oral)    Resp 15    Ht 5' 4"  (1.626 m)    Wt 67.3 kg    SpO2 100%    BMI 25.48 kg/m   Intake/Output Summary (Last 24 hours) at 05/10/2021 1850 Last data filed at 05/10/2021 0532 Gross per 24 hour  Intake 420 ml  Output --  Net 420 ml    Weight change:   Physical Exam: QMG:[id number] resting in bed, daughter and son at bedside IZT:[id number] regular rhythm and normal rate, normal s1 and s2 Resp:Clear to auscultation bilaterally, no rales/rhonchi DXI:PJAS, flat, non-tender Ext:No LE edema  Imaging: No results found.  Labs: BMET Recent Labs  Lab 05/08/21 0309 05/08/21 0731 05/08/21 0735 05/08/21 1615 05/08/21 2236 05/09/21 0245 05/09/21 0637 05/09/21 1020  05/09/21 1826 05/10/21 0533 05/10/21 1120  NA 122* 125*   < > 124*   124* 127* 129* 130* 131* 129* 126* 127*  K 3.4* 3.4*  --  3.5  --  3.7  --   --  4.0 5.0 4.2  CL 91* 94*  --  94*  --  98  --   --  98 98 96*  CO2 23 25  --  22  --  22  --   --  25 23 25   GLUCOSE 99 100*  --  115*  --  102*  --   --  103* 95 88  BUN 9 9  --  8  --  12  --   --  17 17 17   CREATININE 0.55 0.54  --  0.56  --  0.54  --   --  0.53 0.50 0.56  CALCIUM 8.3* 8.6*  --  8.7*  --  8.5*  --   --  8.9 8.8* 8.9  PHOS  --   --   --   --   --   --   --   --   --  3.6  --    < > = values in this interval not displayed.    CBC Recent Labs  Lab 05/07/21 1345 05/08/21 0305  WBC 10.7* 8.1  HGB 11.3* 11.4*  HCT 33.2* 34.4*  MCV 92.0 92.7  PLT 354 339     Medications:     amLODipine  5 mg Oral QHS   apixaban  5 mg Oral BID   ascorbic acid  500 mg Oral Daily   brimonidine  1 drop Left Eye BID   cholecalciferol  400 Units Oral Q breakfast   dorzolamide-timolol  1 drop Left Eye BID   feeding supplement  237 mL Oral BID BM   fluorometholone  1 drop Left Eye QHS   levothyroxine  88 mcg Oral QAC breakfast   losartan  50 mg Oral Daily   metoprolol succinate  25 mg Oral Daily   multivitamin with minerals  1 tablet Oral Daily   pantoprazole  40 mg Oral QHS   polyethylene glycol  17 g Oral Daily   rosuvastatin  5 mg Oral QHS   urea  15 g Oral BID

## 2021-05-10 NOTE — Discharge Planning (Signed)
0700-1400 Patients discharge medication and instructions went over with patient and new medications. Patient's iv taken out and patient wheeled down to lobby for pickup with belongings.

## 2021-05-10 NOTE — TOC CM/SW Note (Signed)
°  Transition of Care (TOC) Screening Note   Patient Details  Name: Susan Bell Date of Birth: 1940/11/10   Transition of Care [facility]) CM/SW Contact:    Ross Ludwig, LCSW Phone Number: 05/10/2021, 1:38 PM    Transition of Care Department [facility]) has reviewed patient and no TOC needs have been identified at this time. We will continue to monitor patient advancement through interdisciplinary progression rounds. If new patient transition needs arise, please place a TOC consult.

## 2021-05-13 DIAGNOSIS — I110 Hypertensive heart disease with heart failure: Secondary | ICD-10-CM | POA: Diagnosis not present

## 2021-05-17 DIAGNOSIS — F418 Other specified anxiety disorders: Secondary | ICD-10-CM | POA: Diagnosis not present

## 2021-05-17 DIAGNOSIS — H40013 Open angle with borderline findings, low risk, bilateral: Secondary | ICD-10-CM | POA: Diagnosis not present

## 2021-05-17 DIAGNOSIS — H0102B Squamous blepharitis left eye, upper and lower eyelids: Secondary | ICD-10-CM | POA: Diagnosis not present

## 2021-05-17 DIAGNOSIS — F430 Acute stress reaction: Secondary | ICD-10-CM | POA: Diagnosis not present

## 2021-05-17 DIAGNOSIS — R634 Abnormal weight loss: Secondary | ICD-10-CM | POA: Diagnosis not present

## 2021-05-17 DIAGNOSIS — G4733 Obstructive sleep apnea (adult) (pediatric): Secondary | ICD-10-CM | POA: Diagnosis not present

## 2021-05-17 DIAGNOSIS — E871 Hypo-osmolality and hyponatremia: Secondary | ICD-10-CM | POA: Diagnosis not present

## 2021-05-17 DIAGNOSIS — I480 Paroxysmal atrial fibrillation: Secondary | ICD-10-CM | POA: Diagnosis not present

## 2021-05-17 DIAGNOSIS — F410 Panic disorder [episodic paroxysmal anxiety] without agoraphobia: Secondary | ICD-10-CM | POA: Diagnosis not present

## 2021-05-17 DIAGNOSIS — I110 Hypertensive heart disease with heart failure: Secondary | ICD-10-CM | POA: Diagnosis not present

## 2021-05-17 DIAGNOSIS — H0102A Squamous blepharitis right eye, upper and lower eyelids: Secondary | ICD-10-CM | POA: Diagnosis not present

## 2021-05-17 DIAGNOSIS — Z961 Presence of intraocular lens: Secondary | ICD-10-CM | POA: Diagnosis not present

## 2021-05-17 DIAGNOSIS — H18513 Endothelial corneal dystrophy, bilateral: Secondary | ICD-10-CM | POA: Diagnosis not present

## 2021-05-18 ENCOUNTER — Other Ambulatory Visit (HOSPITAL_COMMUNITY): Payer: Self-pay | Admitting: Adult Health

## 2021-05-18 ENCOUNTER — Other Ambulatory Visit: Payer: Self-pay | Admitting: Adult Health

## 2021-05-18 DIAGNOSIS — R5383 Other fatigue: Secondary | ICD-10-CM

## 2021-05-18 DIAGNOSIS — R079 Chest pain, unspecified: Secondary | ICD-10-CM

## 2021-05-18 DIAGNOSIS — E871 Hypo-osmolality and hyponatremia: Secondary | ICD-10-CM

## 2021-05-18 DIAGNOSIS — R634 Abnormal weight loss: Secondary | ICD-10-CM

## 2021-05-19 ENCOUNTER — Other Ambulatory Visit: Payer: Self-pay

## 2021-05-19 ENCOUNTER — Encounter: Payer: Self-pay | Admitting: Cardiology

## 2021-05-19 ENCOUNTER — Ambulatory Visit (HOSPITAL_BASED_OUTPATIENT_CLINIC_OR_DEPARTMENT_OTHER)
Admission: RE | Admit: 2021-05-19 | Discharge: 2021-05-19 | Disposition: A | Discharge status: Home / Self Care | Payer: Medicare Other | Source: Ambulatory Visit | Attending: Adult Health | Admitting: Adult Health

## 2021-05-19 ENCOUNTER — Encounter (HOSPITAL_BASED_OUTPATIENT_CLINIC_OR_DEPARTMENT_OTHER): Payer: Self-pay

## 2021-05-19 DIAGNOSIS — N281 Cyst of kidney, acquired: Secondary | ICD-10-CM | POA: Diagnosis not present

## 2021-05-19 DIAGNOSIS — R918 Other nonspecific abnormal finding of lung field: Secondary | ICD-10-CM | POA: Diagnosis not present

## 2021-05-19 DIAGNOSIS — R079 Chest pain, unspecified: Secondary | ICD-10-CM | POA: Insufficient documentation

## 2021-05-19 DIAGNOSIS — I700 Atherosclerosis of aorta: Secondary | ICD-10-CM | POA: Diagnosis not present

## 2021-05-19 DIAGNOSIS — R634 Abnormal weight loss: Secondary | ICD-10-CM | POA: Diagnosis not present

## 2021-05-19 DIAGNOSIS — E871 Hypo-osmolality and hyponatremia: Secondary | ICD-10-CM | POA: Diagnosis not present

## 2021-05-19 DIAGNOSIS — R5383 Other fatigue: Secondary | ICD-10-CM | POA: Diagnosis not present

## 2021-05-19 MED ORDER — IOHEXOL 300 MG/ML  SOLN
80.0000 mL | Freq: Once | INTRAMUSCULAR | Status: AC | PRN
  Administered 2021-05-19: 80 mL via INTRAVENOUS

## 2021-06-03 DIAGNOSIS — R4189 Other symptoms and signs involving cognitive functions and awareness: Secondary | ICD-10-CM | POA: Diagnosis not present

## 2021-06-03 DIAGNOSIS — I480 Paroxysmal atrial fibrillation: Secondary | ICD-10-CM | POA: Diagnosis not present

## 2021-06-03 DIAGNOSIS — E871 Hypo-osmolality and hyponatremia: Secondary | ICD-10-CM | POA: Diagnosis not present

## 2021-06-03 DIAGNOSIS — R634 Abnormal weight loss: Secondary | ICD-10-CM | POA: Diagnosis not present

## 2021-06-03 DIAGNOSIS — G4733 Obstructive sleep apnea (adult) (pediatric): Secondary | ICD-10-CM | POA: Diagnosis not present

## 2021-06-03 DIAGNOSIS — F418 Other specified anxiety disorders: Secondary | ICD-10-CM | POA: Diagnosis not present

## 2021-06-04 DIAGNOSIS — E785 Hyperlipidemia, unspecified: Secondary | ICD-10-CM | POA: Diagnosis not present

## 2021-06-04 DIAGNOSIS — E039 Hypothyroidism, unspecified: Secondary | ICD-10-CM | POA: Diagnosis not present

## 2021-06-21 ENCOUNTER — Ambulatory Visit: Payer: Medicare Other | Admitting: Neurology

## 2021-06-22 DIAGNOSIS — I110 Hypertensive heart disease with heart failure: Secondary | ICD-10-CM | POA: Diagnosis not present

## 2021-06-22 DIAGNOSIS — E785 Hyperlipidemia, unspecified: Secondary | ICD-10-CM | POA: Diagnosis not present

## 2021-06-22 DIAGNOSIS — E559 Vitamin D deficiency, unspecified: Secondary | ICD-10-CM | POA: Diagnosis not present

## 2021-06-22 DIAGNOSIS — E039 Hypothyroidism, unspecified: Secondary | ICD-10-CM | POA: Diagnosis not present

## 2021-06-23 ENCOUNTER — Encounter: Payer: Self-pay | Admitting: Neurology

## 2021-06-23 ENCOUNTER — Ambulatory Visit (INDEPENDENT_AMBULATORY_CARE_PROVIDER_SITE_OTHER): Payer: Medicare Other | Admitting: Neurology

## 2021-06-23 VITALS — BP 170/74 | HR 71 | Ht 64.0 in | Wt 147.0 lb

## 2021-06-23 DIAGNOSIS — E871 Hypo-osmolality and hyponatremia: Secondary | ICD-10-CM | POA: Diagnosis not present

## 2021-06-23 NOTE — Patient Instructions (Signed)
Follow-up with your primary care doctor for management of hyponatremia ?Return if worse or any other concern. ?

## 2021-06-23 NOTE — Progress Notes (Signed)
? ?[facility] ? ?PATIENT: Susan Bell ?DOB: 05/25/1940 ? ?REQUESTING CLINICIAN: Reginold Agent, NP ?HISTORY FROM: Patient and daughter  ?REASON FOR VISIT: Period of confusion, brain fog, recently admitted for hyponatremia  ? ? ?HISTORICAL ? ?CHIEF COMPLAINT:  ?Chief Complaint  ?Patient presents with  ? New Patient (Initial Visit)  ?  Room 12, with daughter ?NP/Paper Proficient/[facility]/Stephanie Edwards NP [phone]/[id number] changes/ ?States symptoms started jan 2023  ? ? ?HISTORY OF PRESENT ILLNESS:  ?This is a 81 year old woman with medical history including atrial fibrillation, on Eliquis, hypertension, hyperlipidemia, hypothyroidism, arthritis and depression who is presenting with history of brain fog can give an impairment since end of January.  At that time patient was found to have hyponatremia, up to 118, she was admitted to the hospital and hospital summary below.   ?Since leaving the hospital she was started on urea for hyponatremia, initially daughter has reported some confusion about her hospital stay, was asking multiple questions regarding what happened during the hospitalization lasted for about 2 minutes but now she is back to her normal self.  She still independent, still able to complete all ADLs and IADLs. ? ? ?Hospital summary  ?Susan Bell is a 81 y.o. female with medical history significant of celiac disease and gluten free diet ,OSA on cPAP, HTN, A-fib on Eliquis has been having confusion for a week, reportedly sodium was checked at PCPs office was 118, was told to increase salt intake, on recheck sodium was 120 , she is advised to come to the ED. she reported was irritable and anxious a few days ago, this symptom has improved after she started on Lexapro, currently she report feeling weak , she stopped driving a week ago due to weakness . she denies headache, denies confusion.  Denies  vomiting or diarrhea, does not take diuretics. No edema. No  polydipsia. ?Hyponatremia- (present on admission) ?-Acute on chronic, sodium 128 in December 2022, sodium 118 a few days ago at PCP office, sodium 120 on presentation ?L-abs consistent with SIADH, hold Lexapro  ?-Nephrology consulted, treated with hypertonic saline- discussed with renal-- would recommend a trial of urea BID with repeat of Na in 7-10 days ? ? ?TBI:  No past history of TBI ?Stroke: no past history of stroke ?Seizures:  no past history of seizures ?Sleep:  no history of sleep apnea.  ?Mood: Patient has a history of depression ? ?Functional status: independent in all ADLs and IADLs ?Patient lives with daughter ?Cooking: patient  ?Cleaning: patient  ?Shopping: Yes  ?Bathing: Yes  ?Toileting: Yes  ?Driving: yes  ?Bills: Yes  ?Ever left the stove on by accident?: No  ?Forget how to use items around the house?: Right after being discharge, forget how to use the sowing machine  ?Getting lost going to familiar places?: No  ?Forgetting loved ones names?: No  ?Word finding difficulty? No  ?Sleep: 8 hours sleep per night ? ? ?OTHER MEDICAL CONDITIONS: Atrial fibrillation, Celiac disease, Hypothyroidism, arthritis, hypertension  ? ? ?REVIEW OF SYSTEMS: Full 14 system review of systems performed and negative with exception of: as noted in the HPI  ? ?ALLERGIES: ?Allergies  ?Allergen Reactions  ? Atorvastatin Other (See Comments)  ?  Knee pain  ? Tommas Olp Antonieta Pert Polysaccharide] Other (See Comments)  ?  Severe constipation   ? Povidone Iodine Itching and Other (See Comments)  ?  With prolonged usage, itching  ? ? ?HOME MEDICATIONS: ?Outpatient Medications Prior to Visit  ?Medication Sig Dispense Refill  ?  acetaminophen (TYLENOL) 500 MG tablet Take 1,000 mg by mouth 2 (two) times daily as needed for mild pain or moderate pain.    ? alendronate (FOSAMAX) 70 MG tablet Take 70 mg by mouth every 14 (fourteen) days.    ? amLODipine (NORVASC) 10 MG tablet Take 0.5 tablets (5 mg total) by mouth daily. (Patient taking  differently: Take 5 mg by mouth at bedtime.) 45 tablet 3  ? Ascorbic Acid (VITAMIN C) 500 MG CAPS Take 500 mg by mouth daily.    ? brimonidine (ALPHAGAN) 0.15 % ophthalmic solution Place 1 drop into the left eye in the morning and at bedtime.    ? Cholecalciferol (VITAMIN D3) 10 MCG (400 UNIT) CAPS Take 400 Units by mouth daily with breakfast.    ? dorzolamide-timolol (COSOPT) 22.3-6.8 MG/ML ophthalmic solution Place 1 drop into the left eye in the morning and at bedtime.    ? ELIQUIS 5 MG TABS tablet TAKE 1 TABLET TWICE A DAY (Patient taking differently: Take 5 mg by mouth in the morning and at bedtime.) 180 tablet 1  ? fluorometholone (FML) 0.1 % ophthalmic suspension Place 1 drop into the left eye in the morning.    ? hydrocortisone (ANUSOL-HC) 2.5 % rectal cream Place 1 application rectally 2 (two) times daily as needed for hemorrhoids.    ? levothyroxine (SYNTHROID) 88 MCG tablet Take 88 mcg by mouth daily before breakfast.    ? loratadine (CLARITIN) 10 MG tablet Take 10 mg by mouth daily as needed for allergies or rhinitis.    ? losartan (COZAAR) 50 MG tablet TAKE 1 TABLET BY MOUTH EVERY DAY (Patient taking differently: Take 50 mg by mouth in the morning.) 90 tablet 3  ? MELATONIN GUMMIES PO Take 0.5 tablets by mouth at bedtime as needed (for sleep).    ? Menthol-Methyl Salicylate (SALONPAS PAIN RELIEF PATCH EX) Apply 1 patch topically daily as needed (for pain).    ? methocarbamol (ROBAXIN) 500 MG tablet Take 500 mg by mouth daily as needed for muscle spasms.    ? metoprolol succinate (TOPROL-XL) 50 MG 24 hr tablet Take 25 mg by mouth daily.    ? Multiple Vitamins-Minerals (ONE-A-DAY WOMENS 50+ ADVANTAGE) TABS Take 1 tablet by mouth daily with breakfast.    ? PATADAY 0.2 % SOLN Place 1 drop into both eyes daily as needed (allergies).     ? PRILOSEC OTC 20 MG tablet Take 20 mg by mouth at bedtime.    ? rosuvastatin (CRESTOR) 5 MG tablet Take 5 mg by mouth at bedtime.  3  ? SYSTANE ULTRA 0.4-0.3 % SOLN Place 1  drop into both eyes daily as needed (dry eyes).     ? traMADol (ULTRAM) 50 MG tablet Take 50 mg by mouth every 6 (six) hours as needed for moderate pain.    ? urea (URE-NA) 15 g PACK oral packet Take 15 g by mouth 2 (two) times daily. 60 packet 0  ? venlafaxine (EFFEXOR) 37.5 MG tablet TAKE 1 TABLET WITH FOOD ORALLY ONCE A DAY FOR ANXIETY/MOOD    ? ?No facility-administered medications prior to visit.  ? ? ?PAST MEDICAL HISTORY: ?Past Medical History:  ?Diagnosis Date  ? Allergy   ? seasonal  ? Anemia   ? Arthritis   ? Cataract   ? bilateral removed  ? Celiac disease   ? Eczema   ? H/O transfusion of platelets   ? Hyperlipidemia   ? Hypothyroidism   ? OSA (obstructive sleep apnea)   ?  Osteopenia   ? Paroxysmal atrial fibrillation (HCC)   ? Shingles   ? Vitamin D deficiency   ? ? ?PAST SURGICAL HISTORY: ?Past Surgical History:  ?Procedure Laterality Date  ? ATRIAL FIBRILLATION ABLATION N/A 09/16/2020  ? Procedure: ATRIAL FIBRILLATION ABLATION;  Surgeon: Constance Haw, MD;  Location: [place] [facility];  Service: Cardiovascular;  Laterality: N/A;  ? CARDIOVERSION N/A 12/14/2017  ? Procedure: CARDIOVERSION;  Surgeon: Sanda Klein, MD;  Location: [place];  Service: Cardiovascular;  Laterality: N/A;  ? CARDIOVERSION N/A 11/07/2019  ? Procedure: CARDIOVERSION;  Surgeon: Jerline Pain, MD;  Location: [facility];  Service: Cardiovascular;  Laterality: N/A;  ? CARDIOVERSION N/A 03/24/2020  ? Procedure: CARDIOVERSION;  Surgeon: [name], MD;  Location: [facility]OSCOPY;  Service: Cardiovascular;  Laterality: N/A;  ? CARDIOVERSION N/A 05/11/2020  ? Procedure: CARDIOVERSION;  Surgeon: Fay Records, MD;  Location: [place];  Service: Cardiovascular;  Laterality: N/A;  ? CATARACT EXTRACTION    ? bilateral  ? COLONOSCOPY    ? DILATION AND CURETTAGE OF UTERUS    ? UPPER GI ENDOSCOPY    ? ? ?FAMILY HISTORY: ?Family History  ?Problem Relation Age of Onset  ? Liver cancer Mother   ? Diabetes Father   ? Heart  disease Father   ? Colon cancer Neg Hx   ? ? ?SOCIAL HISTORY: ?Social History  ? ?Socioeconomic History  ? Marital status: Widowed  ?  Spouse name: Not on file  ? Number of children: 4  ? Years of education: Not o

## 2021-06-24 DIAGNOSIS — M25562 Pain in left knee: Secondary | ICD-10-CM | POA: Diagnosis not present

## 2021-06-24 DIAGNOSIS — M25512 Pain in left shoulder: Secondary | ICD-10-CM | POA: Diagnosis not present

## 2021-06-25 DIAGNOSIS — F325 Major depressive disorder, single episode, in full remission: Secondary | ICD-10-CM | POA: Diagnosis not present

## 2021-06-25 DIAGNOSIS — F411 Generalized anxiety disorder: Secondary | ICD-10-CM | POA: Diagnosis not present

## 2021-06-25 DIAGNOSIS — F418 Other specified anxiety disorders: Secondary | ICD-10-CM | POA: Diagnosis not present

## 2021-06-25 DIAGNOSIS — F410 Panic disorder [episodic paroxysmal anxiety] without agoraphobia: Secondary | ICD-10-CM | POA: Diagnosis not present

## 2021-06-25 DIAGNOSIS — F321 Major depressive disorder, single episode, moderate: Secondary | ICD-10-CM | POA: Diagnosis not present

## 2021-06-29 DIAGNOSIS — M8580 Other specified disorders of bone density and structure, unspecified site: Secondary | ICD-10-CM | POA: Diagnosis not present

## 2021-06-29 DIAGNOSIS — Z0000 Encounter for general adult medical examination without abnormal findings: Secondary | ICD-10-CM | POA: Diagnosis not present

## 2021-06-29 DIAGNOSIS — I2510 Atherosclerotic heart disease of native coronary artery without angina pectoris: Secondary | ICD-10-CM | POA: Diagnosis not present

## 2021-06-29 DIAGNOSIS — Z7901 Long term (current) use of anticoagulants: Secondary | ICD-10-CM | POA: Diagnosis not present

## 2021-06-29 DIAGNOSIS — E039 Hypothyroidism, unspecified: Secondary | ICD-10-CM | POA: Diagnosis not present

## 2021-06-29 DIAGNOSIS — F325 Major depressive disorder, single episode, in full remission: Secondary | ICD-10-CM | POA: Diagnosis not present

## 2021-06-29 DIAGNOSIS — I5022 Chronic systolic (congestive) heart failure: Secondary | ICD-10-CM | POA: Diagnosis not present

## 2021-06-29 DIAGNOSIS — D6869 Other thrombophilia: Secondary | ICD-10-CM | POA: Diagnosis not present

## 2021-06-29 DIAGNOSIS — I110 Hypertensive heart disease with heart failure: Secondary | ICD-10-CM | POA: Diagnosis not present

## 2021-06-29 DIAGNOSIS — I2584 Coronary atherosclerosis due to calcified coronary lesion: Secondary | ICD-10-CM | POA: Diagnosis not present

## 2021-06-29 DIAGNOSIS — E785 Hyperlipidemia, unspecified: Secondary | ICD-10-CM | POA: Diagnosis not present

## 2021-06-29 DIAGNOSIS — D692 Other nonthrombocytopenic purpura: Secondary | ICD-10-CM | POA: Diagnosis not present

## 2021-06-29 DIAGNOSIS — R82998 Other abnormal findings in urine: Secondary | ICD-10-CM | POA: Diagnosis not present

## 2021-07-14 DIAGNOSIS — E785 Hyperlipidemia, unspecified: Secondary | ICD-10-CM | POA: Diagnosis not present

## 2021-07-30 DIAGNOSIS — E785 Hyperlipidemia, unspecified: Secondary | ICD-10-CM | POA: Diagnosis not present

## 2021-08-01 DIAGNOSIS — I480 Paroxysmal atrial fibrillation: Secondary | ICD-10-CM | POA: Diagnosis not present

## 2021-08-01 DIAGNOSIS — E785 Hyperlipidemia, unspecified: Secondary | ICD-10-CM | POA: Diagnosis not present

## 2021-08-01 DIAGNOSIS — I110 Hypertensive heart disease with heart failure: Secondary | ICD-10-CM | POA: Diagnosis not present

## 2021-08-13 DIAGNOSIS — I110 Hypertensive heart disease with heart failure: Secondary | ICD-10-CM | POA: Diagnosis not present

## 2021-08-13 DIAGNOSIS — E785 Hyperlipidemia, unspecified: Secondary | ICD-10-CM | POA: Diagnosis not present

## 2021-09-01 DIAGNOSIS — I5022 Chronic systolic (congestive) heart failure: Secondary | ICD-10-CM | POA: Diagnosis not present

## 2021-09-01 DIAGNOSIS — E785 Hyperlipidemia, unspecified: Secondary | ICD-10-CM | POA: Diagnosis not present

## 2021-09-01 DIAGNOSIS — I2510 Atherosclerotic heart disease of native coronary artery without angina pectoris: Secondary | ICD-10-CM | POA: Diagnosis not present

## 2021-09-01 DIAGNOSIS — I480 Paroxysmal atrial fibrillation: Secondary | ICD-10-CM | POA: Diagnosis not present

## 2021-09-10 DIAGNOSIS — I110 Hypertensive heart disease with heart failure: Secondary | ICD-10-CM | POA: Diagnosis not present

## 2021-09-10 DIAGNOSIS — E785 Hyperlipidemia, unspecified: Secondary | ICD-10-CM | POA: Diagnosis not present

## 2021-09-14 ENCOUNTER — Other Ambulatory Visit (HOSPITAL_COMMUNITY): Payer: Self-pay | Admitting: Nurse Practitioner

## 2021-09-14 ENCOUNTER — Ambulatory Visit (INDEPENDENT_AMBULATORY_CARE_PROVIDER_SITE_OTHER): Payer: Medicare Other | Admitting: Cardiology

## 2021-09-14 ENCOUNTER — Encounter: Payer: Self-pay | Admitting: Cardiology

## 2021-09-14 VITALS — BP 158/60 | HR 64 | Ht 64.0 in | Wt 150.0 lb

## 2021-09-14 DIAGNOSIS — I4819 Other persistent atrial fibrillation: Secondary | ICD-10-CM

## 2021-09-14 NOTE — Progress Notes (Signed)
Electrophysiology Office Note   Date:  09/14/2021   ID:  Susan Bell, DOB 13-Jan-1941, MRN [id number]  PCP:  Shon Baton, MD  Cardiologist:  Hochrein Primary Electrophysiologist: Gaye Alken, MD    Chief Complaint: AF   History of Present Illness: Susan Bell is a 81 y.o. female who is being seen today for the evaluation of AF at the request of Shon Baton, MD. Presenting today for electrophysiology evaluation.  She has a history significant for hyperlipidemia, hypothyroidism, atrial fibrillation.  She presented to emergency room 10/26/2017.  She was cardioverted at that time.  She went back into atrial fibrillation but was unaware.  Sleep study showed sleep apnea.  Echo showed an ejection fraction of 25%.  She was admitted for dofetilide load.  QT prolonged.  She is status post atrial fibrillation ablation 09/16/2020.  Today, denies symptoms of palpitations, chest pain, shortness of breath, orthopnea, PND, lower extremity edema, claudication, dizziness, presyncope, syncope, bleeding, or neurologic sequela. The patient is tolerating medications without difficulties.  Since being seen she has done well.  She has noted no further episodes of atrial fibrillation.  Unfortunately she had a significantly low sodium requiring hospitalization.  She is currently recovering from that.   Past Medical History:  Diagnosis Date   Allergy    seasonal   Anemia    Arthritis    Cataract    bilateral removed   Celiac disease    Eczema    H/O transfusion of platelets    Hyperlipidemia    Hypothyroidism    OSA (obstructive sleep apnea)    Osteopenia    Paroxysmal atrial fibrillation (HCC)    Shingles    Vitamin D deficiency    Past Surgical History:  Procedure Laterality Date   ATRIAL FIBRILLATION ABLATION N/A 09/16/2020   Procedure: ATRIAL FIBRILLATION ABLATION;  Surgeon: Constance Haw, MD;  Location: [place] [facility];  Service: Cardiovascular;  Laterality:  N/A;   CARDIOVERSION N/A 12/14/2017   Procedure: CARDIOVERSION;  Surgeon: Sanda Klein, MD;  Location: [place];  Service: Cardiovascular;  Laterality: N/A;   CARDIOVERSION N/A 11/07/2019   Procedure: CARDIOVERSION;  Surgeon: Jerline Pain, MD;  Location: [place];  Service: Cardiovascular;  Laterality: N/A;   CARDIOVERSION N/A 03/24/2020   Procedure: CARDIOVERSION;  Surgeon: [name], MD;  Location: [place];  Service: Cardiovascular;  Laterality: N/A;   CARDIOVERSION N/A 05/11/2020   Procedure: CARDIOVERSION;  Surgeon: Fay Records, MD;  Location: [place];  Service: Cardiovascular;  Laterality: N/A;   CATARACT EXTRACTION     bilateral   COLONOSCOPY     DILATION AND CURETTAGE OF UTERUS     UPPER GI ENDOSCOPY       Current Outpatient Medications  Medication Sig Dispense Refill   acetaminophen (TYLENOL) 500 MG tablet Take 1,000 mg by mouth 2 (two) times daily as needed for mild pain or moderate pain.     alendronate (FOSAMAX) 70 MG tablet Take 70 mg by mouth every 14 (fourteen) days.     amLODipine (NORVASC) 10 MG tablet Take 0.5 tablets (5 mg total) by mouth daily. (Patient taking differently: Take 5 mg by mouth at bedtime.) 45 tablet 3   Ascorbic Acid (VITAMIN C) 500 MG CAPS Take 500 mg by mouth daily.     brimonidine (ALPHAGAN) 0.15 % ophthalmic solution Place 1 drop into the left eye in the morning and at bedtime.     Cholecalciferol (VITAMIN D3) 10 MCG (400 UNIT)  CAPS Take 400 Units by mouth daily with breakfast.     dorzolamide-timolol (COSOPT) 22.3-6.8 MG/ML ophthalmic solution Place 1 drop into the left eye in the morning and at bedtime.     ELIQUIS 5 MG TABS tablet TAKE 1 TABLET TWICE A DAY (Patient taking differently: Take 5 mg by mouth in the morning and at bedtime.) 180 tablet 1   fluorometholone (FML) 0.1 % ophthalmic suspension Place 1 drop into the left eye in the morning.     hydrocortisone (ANUSOL-HC) 2.5 % rectal cream Place 1 application  rectally 2 (two) times daily as needed for hemorrhoids.     levothyroxine (SYNTHROID) 88 MCG tablet Take 88 mcg by mouth daily before breakfast.     loratadine (CLARITIN) 10 MG tablet Take 10 mg by mouth daily as needed for allergies or rhinitis.     losartan (COZAAR) 50 MG tablet TAKE 1 TABLET BY MOUTH EVERY DAY (Patient taking differently: Take 50 mg by mouth in the morning.) 90 tablet 3   MELATONIN GUMMIES PO Take 0.5 tablets by mouth at bedtime as needed (for sleep).     Menthol-Methyl Salicylate (SALONPAS PAIN RELIEF PATCH EX) Apply 1 patch topically daily as needed (for pain).     methocarbamol (ROBAXIN) 500 MG tablet Take 500 mg by mouth daily as needed for muscle spasms.     metoprolol succinate (TOPROL-XL) 50 MG 24 hr tablet Take 25 mg by mouth daily.     Multiple Vitamins-Minerals (ONE-A-DAY WOMENS 50+ ADVANTAGE) TABS Take 1 tablet by mouth daily with breakfast.     PATADAY 0.2 % SOLN Place 1 drop into both eyes daily as needed (allergies).      PRILOSEC OTC 20 MG tablet Take 20 mg by mouth at bedtime.     rosuvastatin (CRESTOR) 5 MG tablet Take 5 mg by mouth at bedtime.  3   SYSTANE ULTRA 0.4-0.3 % SOLN Place 1 drop into both eyes daily as needed (dry eyes).      traMADol (ULTRAM) 50 MG tablet Take 50 mg by mouth every 6 (six) hours as needed for moderate pain.     urea (URE-NA) 15 g PACK oral packet Take 15 g by mouth 2 (two) times daily. 60 packet 0   venlafaxine (EFFEXOR) 37.5 MG tablet TAKE 1 TABLET WITH FOOD ORALLY ONCE A DAY FOR ANXIETY/MOOD     No current facility-administered medications for this visit.    Allergies:   Atorvastatin, Ferrex [iron polysaccharide], and Povidone iodine   Social History:  The patient  reports that she has never smoked. She has never used smokeless tobacco. She reports that she does not drink alcohol and does not use drugs.   Family History:  The patient's family history includes Diabetes in her father; Heart disease in her father; Liver cancer  in her mother.   ROS:  Please see the history of present illness.   Otherwise, review of systems is positive for none.   All other systems are reviewed and negative.   PHYSICAL EXAM: VS:  BP (!) 158/60   Pulse 64   Ht 5' 4"  (1.626 m)   Wt 150 lb (68 kg)   SpO2 96%   BMI 25.75 kg/m  , BMI Body mass index is 25.75 kg/m. GEN: Well nourished, well developed, in no acute distress  HEENT: normal  Neck: no JVD, carotid bruits, or masses Cardiac: RRR; no murmurs, rubs, or gallops,no edema  Respiratory:  clear to auscultation bilaterally, normal work of breathing GI: soft,  nontender, nondistended, + BS MS: no deformity or atrophy  Skin: warm and dry Neuro:  Strength and sensation are intact Psych: euthymic mood, full affect  EKG:  EKG is ordered today. Personal review of the ekg ordered shows sinus rhythm, rate 64  Recent Labs: 05/07/2021: ALT 17; TSH 3.278 05/08/2021: Hemoglobin 11.4; Magnesium 2.1; Platelets 339 05/10/2021: BUN 17; Creatinine, Ser 0.56; Potassium 4.2; Sodium 127    Lipid Panel     Component Value Date/Time   CHOL 216 (H) 05/07/2021 1345   TRIG 61 05/07/2021 1345   HDL 89 05/07/2021 1345   CHOLHDL 2.4 05/07/2021 1345   VLDL 12 05/07/2021 1345   LDLCALC 115 (H) 05/07/2021 1345     Wt Readings from Last 3 Encounters:  09/14/21 150 lb (68 kg)  06/23/21 147 lb (66.7 kg)  05/07/21 148 lb 7 oz (67.3 kg)      Other studies Reviewed: Additional studies/ records that were reviewed today include: TTE 10/24/18  Review of the above records today demonstrates:   1. The left ventricle has normal systolic function with an ejection  fraction of 60-65%. The cavity size was normal. There is mild concentric  left ventricular hypertrophy. Left ventricular diastolic Doppler  parameters are consistent with  pseudonormalization. Elevated mean left atrial pressure.   2. The right ventricle has normal systolic function. The cavity was  normal. There is no increase in right  ventricular wall thickness. Right  ventricular systolic pressure is normal with an estimated pressure of 36.9  mmHg.   3. Left atrial size was mildly dilated.   4. There is mild to moderate mitral annular calcification present.   5. The aorta is normal in size and structure.    ASSESSMENT AND PLAN:  1.  Persistent atrial fibrillation: CHA2DS2-VASc of 3.  Currently on Eliquis 5 mg twice daily.  Status post ablation 09/16/2020.  Remains in sinus rhythm.  No changes at this time.  2.  Secondary hypercoagulable state: Currently on Eliquis for atrial fibrillation as above  3.  Hypertension: Today.  Usually well controlled at home.  No changes.   Current medicines are reviewed at length with the patient today.   The patient does not have concerns regarding her medicines.  The following changes were made today: None  Labs/ tests ordered today include:  Orders Placed This Encounter  Procedures   EKG 12-Lead      Disposition:   FU 12 months  Signed, Tacara Hadlock Meredith Leeds, MD  09/14/2021 3:50 PM     [place] [address] [phone] (office) [phone] (fax)

## 2021-09-15 DIAGNOSIS — M25562 Pain in left knee: Secondary | ICD-10-CM | POA: Diagnosis not present

## 2021-09-15 DIAGNOSIS — M25512 Pain in left shoulder: Secondary | ICD-10-CM | POA: Diagnosis not present

## 2021-09-15 DIAGNOSIS — M25511 Pain in right shoulder: Secondary | ICD-10-CM | POA: Diagnosis not present

## 2021-09-24 ENCOUNTER — Emergency Department (HOSPITAL_BASED_OUTPATIENT_CLINIC_OR_DEPARTMENT_OTHER): Payer: Medicare Other

## 2021-09-24 ENCOUNTER — Emergency Department (HOSPITAL_BASED_OUTPATIENT_CLINIC_OR_DEPARTMENT_OTHER)
Admission: EM | Admit: 2021-09-24 | Discharge: 2021-09-24 | Disposition: A | Discharge status: Home / Self Care | Payer: Medicare Other | Attending: Emergency Medicine | Admitting: Emergency Medicine

## 2021-09-24 ENCOUNTER — Other Ambulatory Visit: Payer: Self-pay

## 2021-09-24 ENCOUNTER — Encounter (HOSPITAL_BASED_OUTPATIENT_CLINIC_OR_DEPARTMENT_OTHER): Payer: Self-pay

## 2021-09-24 ENCOUNTER — Telehealth: Payer: Self-pay | Admitting: Cardiology

## 2021-09-24 DIAGNOSIS — R42 Dizziness and giddiness: Secondary | ICD-10-CM | POA: Diagnosis not present

## 2021-09-24 DIAGNOSIS — Z7901 Long term (current) use of anticoagulants: Secondary | ICD-10-CM | POA: Insufficient documentation

## 2021-09-24 DIAGNOSIS — I4891 Unspecified atrial fibrillation: Secondary | ICD-10-CM | POA: Diagnosis not present

## 2021-09-24 LAB — BASIC METABOLIC PANEL
Anion gap: 8 (ref 5–15)
BUN: 29 mg/dL — ABNORMAL HIGH (ref 8–23)
CO2: 26 mmol/L (ref 22–32)
Calcium: 9.8 mg/dL (ref 8.9–10.3)
Chloride: 97 mmol/L — ABNORMAL LOW (ref 98–111)
Creatinine, Ser: 0.79 mg/dL (ref 0.44–1.00)
GFR, Estimated: >60 mL/min (ref >60)
Glucose, Bld: 106 mg/dL — ABNORMAL HIGH (ref 70–99)
Potassium: 4.2 mmol/L (ref 3.5–5.1)
Sodium: 131 mmol/L — ABNORMAL LOW (ref 135–145)

## 2021-09-24 LAB — CBC
HCT: 36.9 % (ref 36.0–46.0)
Hemoglobin: 12.2 g/dL (ref 12.0–15.0)
MCH: 30.1 pg (ref 26.0–34.0)
MCHC: 33.1 g/dL (ref 30.0–36.0)
MCV: 91.1 fL (ref 80.0–100.0)
Platelets: 389 10*3/uL (ref 150–400)
RBC: 4.05 MIL/uL (ref 3.87–5.11)
RDW: 13.5 % (ref 11.5–15.5)
WBC: 9.2 10*3/uL (ref 4.0–10.5)
nRBC: 0 % (ref 0.0–0.2)

## 2021-09-24 LAB — TROPONIN I (HIGH SENSITIVITY): Troponin I (High Sensitivity): 17 ng/L (ref <18)

## 2021-09-24 MED ORDER — SODIUM CHLORIDE 0.9 % IV BOLUS
500.0000 mL | Freq: Once | INTRAVENOUS | Status: AC
  Administered 2021-09-24: 500 mL via INTRAVENOUS

## 2021-09-24 MED ORDER — MECLIZINE HCL 25 MG PO TABS
25.0000 mg | ORAL_TABLET | Freq: Once | ORAL | Status: AC
  Administered 2021-09-24: 25 mg via ORAL
  Filled 2021-09-24: qty 1

## 2021-09-24 MED ORDER — MECLIZINE HCL 25 MG PO TABS: 25.0000 mg | ORAL_TABLET | Freq: Three times a day (TID) | ORAL | 0 refills | Status: AC | PRN

## 2021-09-24 MED ORDER — METOPROLOL TARTRATE 5 MG/5ML IV SOLN
2.5000 mg | Freq: Once | INTRAVENOUS | Status: AC
  Administered 2021-09-24: 2.5 mg via INTRAVENOUS
  Filled 2021-09-24: qty 5

## 2021-09-24 NOTE — Telephone Encounter (Signed)
Spoke to pt and dtr. Reviewed w/ Dr. Elberta Fortis. Due to low BP and unable to advise on taking extra Metoprolol, pt advised to go to ED for evaluation. Patient & dtr verbalized understanding and agreeable to plan.

## 2021-09-27 ENCOUNTER — Encounter: Payer: Self-pay | Admitting: Cardiology

## 2021-11-02 DIAGNOSIS — H0102A Squamous blepharitis right eye, upper and lower eyelids: Secondary | ICD-10-CM | POA: Diagnosis not present

## 2021-11-02 DIAGNOSIS — H40013 Open angle with borderline findings, low risk, bilateral: Secondary | ICD-10-CM | POA: Diagnosis not present

## 2021-11-02 DIAGNOSIS — H1045 Other chronic allergic conjunctivitis: Secondary | ICD-10-CM | POA: Diagnosis not present

## 2021-11-02 DIAGNOSIS — Z961 Presence of intraocular lens: Secondary | ICD-10-CM | POA: Diagnosis not present

## 2021-11-02 DIAGNOSIS — H18513 Endothelial corneal dystrophy, bilateral: Secondary | ICD-10-CM | POA: Diagnosis not present

## 2021-11-02 DIAGNOSIS — H0102B Squamous blepharitis left eye, upper and lower eyelids: Secondary | ICD-10-CM | POA: Diagnosis not present

## 2021-11-02 DIAGNOSIS — Z947 Corneal transplant status: Secondary | ICD-10-CM | POA: Diagnosis not present

## 2021-11-09 ENCOUNTER — Other Ambulatory Visit: Payer: Self-pay | Admitting: *Deleted

## 2021-11-09 DIAGNOSIS — I4819 Other persistent atrial fibrillation: Secondary | ICD-10-CM

## 2021-11-09 MED ORDER — APIXABAN 5 MG PO TABS: 5.0000 mg | ORAL_TABLET | Freq: Two times a day (BID) | ORAL | 1 refills | Status: DC

## 2021-11-09 NOTE — Telephone Encounter (Signed)
Eliquis 74m refill request received. Patient is 81years old, weight-68kg, Crea-0.79 on 09/24/2021, Diagnosis-Afib, and last seen by Dr CCurt Bearson 09/14/2021. Dose is appropriate based on dosing criteria. Will send in refill to requested pharmacy.

## 2021-11-12 DIAGNOSIS — R7989 Other specified abnormal findings of blood chemistry: Secondary | ICD-10-CM | POA: Diagnosis not present

## 2021-11-12 DIAGNOSIS — E785 Hyperlipidemia, unspecified: Secondary | ICD-10-CM | POA: Diagnosis not present

## 2021-11-16 DIAGNOSIS — Z1231 Encounter for screening mammogram for malignant neoplasm of breast: Secondary | ICD-10-CM | POA: Diagnosis not present

## 2021-12-15 ENCOUNTER — Telehealth: Payer: Self-pay | Admitting: Cardiology

## 2021-12-15 DIAGNOSIS — M25562 Pain in left knee: Secondary | ICD-10-CM | POA: Diagnosis not present

## 2021-12-15 NOTE — Telephone Encounter (Signed)
Patient c/o Palpitations:  High priority if patient c/o lightheadedness, shortness of breath, or chest pain  How long have you had palpitations/irregular HR/ Afib? Are you having the symptoms now? No  Are you currently experiencing lightheadedness, SOB or CP? No  Do you have a history of afib (atrial fibrillation) or irregular heart rhythm? Yes  Have you checked your BP or HR? (document readings if available):  133/63 HR 61 09/13    Are you experiencing any other symptoms? No   Pt states she had Cortizone shot in left knee this morning and HR got up to 132 and based on previous experience after Cortizone, pt now believes this was the cause after the last episode. She would like a call back to discuss this and what to do in future cases of having this shot.

## 2021-12-15 NOTE — Telephone Encounter (Signed)
Returned pt call. Aware it could be correlated. She appreciates my follow up

## 2021-12-22 ENCOUNTER — Telehealth: Payer: Self-pay | Admitting: Licensed Clinical Social Worker

## 2021-12-22 NOTE — Patient Outreach (Signed)
  Care Coordination   12/22/2021 Name: Susan Bell MRN: [id number] DOB: [birth year]   Care Coordination Outreach Attempts:  An unsuccessful telephone outreach was attempted today to offer the patient information about available care coordination services as a benefit of their health plan.   Follow Up Plan:  Additional outreach attempts will be made to offer the patient care coordination information and services.   Encounter Outcome:  No Answer  Care Coordination Interventions Activated:  No   Care Coordination Interventions:  No, not indicated    Christa See, [email or web address], [email or web address] Phone [phone] 12:47 PM

## 2022-01-06 DIAGNOSIS — Z947 Corneal transplant status: Secondary | ICD-10-CM | POA: Diagnosis not present

## 2022-01-06 DIAGNOSIS — I2510 Atherosclerotic heart disease of native coronary artery without angina pectoris: Secondary | ICD-10-CM | POA: Diagnosis not present

## 2022-01-06 DIAGNOSIS — I110 Hypertensive heart disease with heart failure: Secondary | ICD-10-CM | POA: Diagnosis not present

## 2022-01-06 DIAGNOSIS — I4891 Unspecified atrial fibrillation: Secondary | ICD-10-CM | POA: Diagnosis not present

## 2022-01-06 DIAGNOSIS — E871 Hypo-osmolality and hyponatremia: Secondary | ICD-10-CM | POA: Diagnosis not present

## 2022-01-06 DIAGNOSIS — E039 Hypothyroidism, unspecified: Secondary | ICD-10-CM | POA: Diagnosis not present

## 2022-01-06 DIAGNOSIS — D692 Other nonthrombocytopenic purpura: Secondary | ICD-10-CM | POA: Diagnosis not present

## 2022-01-06 DIAGNOSIS — D6869 Other thrombophilia: Secondary | ICD-10-CM | POA: Diagnosis not present

## 2022-01-06 DIAGNOSIS — Z23 Encounter for immunization: Secondary | ICD-10-CM | POA: Diagnosis not present

## 2022-01-06 DIAGNOSIS — Z7901 Long term (current) use of anticoagulants: Secondary | ICD-10-CM | POA: Diagnosis not present

## 2022-01-06 DIAGNOSIS — I5022 Chronic systolic (congestive) heart failure: Secondary | ICD-10-CM | POA: Diagnosis not present

## 2022-01-06 DIAGNOSIS — E785 Hyperlipidemia, unspecified: Secondary | ICD-10-CM | POA: Diagnosis not present

## 2022-02-09 DIAGNOSIS — N390 Urinary tract infection, site not specified: Secondary | ICD-10-CM | POA: Diagnosis not present

## 2022-02-16 DIAGNOSIS — R399 Unspecified symptoms and signs involving the genitourinary system: Secondary | ICD-10-CM | POA: Diagnosis not present

## 2022-02-17 DIAGNOSIS — I480 Paroxysmal atrial fibrillation: Secondary | ICD-10-CM | POA: Diagnosis not present

## 2022-02-17 DIAGNOSIS — R112 Nausea with vomiting, unspecified: Secondary | ICD-10-CM | POA: Diagnosis not present

## 2022-02-17 DIAGNOSIS — E871 Hypo-osmolality and hyponatremia: Secondary | ICD-10-CM | POA: Diagnosis not present

## 2022-02-17 DIAGNOSIS — I110 Hypertensive heart disease with heart failure: Secondary | ICD-10-CM | POA: Diagnosis not present

## 2022-02-17 DIAGNOSIS — I5022 Chronic systolic (congestive) heart failure: Secondary | ICD-10-CM | POA: Diagnosis not present

## 2022-02-21 ENCOUNTER — Encounter: Payer: Self-pay | Admitting: Cardiology

## 2022-02-21 DIAGNOSIS — I5022 Chronic systolic (congestive) heart failure: Secondary | ICD-10-CM | POA: Diagnosis not present

## 2022-02-21 DIAGNOSIS — E871 Hypo-osmolality and hyponatremia: Secondary | ICD-10-CM | POA: Diagnosis not present

## 2022-02-21 DIAGNOSIS — I480 Paroxysmal atrial fibrillation: Secondary | ICD-10-CM | POA: Diagnosis not present

## 2022-02-21 DIAGNOSIS — I110 Hypertensive heart disease with heart failure: Secondary | ICD-10-CM | POA: Diagnosis not present

## 2022-02-22 ENCOUNTER — Telehealth: Payer: Self-pay | Admitting: Cardiology

## 2022-02-22 NOTE — Telephone Encounter (Signed)
Patient c/o Palpitations:  High priority if patient c/o lightheadedness, shortness of breath, or chest pain  How long have you had palpitations/irregular HR/ Afib? Dr. Keane Police NP said she was in A-Fib. She went yesterday to see the NP. Are you having the symptoms now? Yes   Are you currently experiencing lightheadedness, SOB or CP? No   Do you have a history of afib (atrial fibrillation) or irregular heart rhythm? Has history of A-Fib.   Have you checked your BP or HR? (document readings if available): BP 109/59 HR 110  Are you experiencing any other symptoms? Stated she was very tired last night, doesn't feel rested this morning.

## 2022-02-22 NOTE — Telephone Encounter (Signed)
Patient reports she was seen by PCP yesterday and was told she is in afib. She states her rate is controled and her lab work was normal. PCP instructed her to make Dr. Curt Bears aware.   She denies chest pain and SOB. She does report feeling more fatigued than usual. She is taking Eliquis.   Advised I would forward this to Dr. Curt Bears. Also is she become symptomatic, develops chest pain, SOB or other symptoms to to the ED for evaluation. Patient verbalized understanding.

## 2022-02-23 NOTE — Telephone Encounter (Signed)
Pt is requesting back for update and to speak to [place], Therapist, sports.

## 2022-02-23 NOTE — Telephone Encounter (Signed)
Patient being treated for UTI - just finished antibiotics she is unsure when she went back into Afib - she does not check her HRs on a regular basis at home and is not always symptomatic with afib.  BP 124/88 HR 125 currently. Discussed with Adline Peals PA will increase metoprolol to 72m BID and call with update on Monday. If convert before Monday and becomes bradycardic will go back to normal dosing of metoprolol. ER precautions given. Pt verbalized agreement.

## 2022-02-23 NOTE — Telephone Encounter (Signed)
Spoke with the patient and advised her that Dr. Curt Bears would like her to be seen at AFIB clinic. Advised that we have sent them a message and they should be contacting her for an appointment.

## 2022-02-28 NOTE — Telephone Encounter (Signed)
Pt converted to NSR on Friday morning. She has returned to her normal dosing of metoprolol as of Friday and feels back to baseline. Pt will call if issues arise.

## 2022-03-08 DIAGNOSIS — Z85828 Personal history of other malignant neoplasm of skin: Secondary | ICD-10-CM | POA: Diagnosis not present

## 2022-03-08 DIAGNOSIS — L821 Other seborrheic keratosis: Secondary | ICD-10-CM | POA: Diagnosis not present

## 2022-03-08 DIAGNOSIS — D2261 Melanocytic nevi of right upper limb, including shoulder: Secondary | ICD-10-CM | POA: Diagnosis not present

## 2022-03-08 DIAGNOSIS — L853 Xerosis cutis: Secondary | ICD-10-CM | POA: Diagnosis not present

## 2022-03-08 DIAGNOSIS — D692 Other nonthrombocytopenic purpura: Secondary | ICD-10-CM | POA: Diagnosis not present

## 2022-03-08 DIAGNOSIS — D2262 Melanocytic nevi of left upper limb, including shoulder: Secondary | ICD-10-CM | POA: Diagnosis not present

## 2022-03-08 DIAGNOSIS — L720 Epidermal cyst: Secondary | ICD-10-CM | POA: Diagnosis not present

## 2022-03-08 DIAGNOSIS — L814 Other melanin hyperpigmentation: Secondary | ICD-10-CM | POA: Diagnosis not present

## 2022-03-08 DIAGNOSIS — D1801 Hemangioma of skin and subcutaneous tissue: Secondary | ICD-10-CM | POA: Diagnosis not present

## 2022-03-16 ENCOUNTER — Other Ambulatory Visit (HOSPITAL_COMMUNITY): Payer: Self-pay | Admitting: Cardiology

## 2022-04-16 ENCOUNTER — Other Ambulatory Visit: Payer: Self-pay | Admitting: Cardiology

## 2022-04-19 ENCOUNTER — Telehealth: Payer: Self-pay | Admitting: Cardiology

## 2022-04-19 ENCOUNTER — Other Ambulatory Visit: Payer: Self-pay

## 2022-04-19 DIAGNOSIS — I4819 Other persistent atrial fibrillation: Secondary | ICD-10-CM

## 2022-04-19 MED ORDER — APIXABAN 5 MG PO TABS: 5.0000 mg | ORAL_TABLET | Freq: Two times a day (BID) | ORAL | 1 refills | Status: DC

## 2022-04-19 NOTE — Telephone Encounter (Signed)
Pt c/o medication issue:  1. Name of Medication:   apixaban (ELIQUIS) 5 MG TABS tablet    2. How are you currently taking this medication (dosage and times per day)?   Take 1 tablet (5 mg total) by mouth 2 (two) times daily.    3. Are you having a reaction (difficulty breathing--STAT)? No  4. What is your medication issue? Pt states that new prescription for medication is needed to be sent to pharmacy. Please advise   [facility] [place], [place] to [facility] [employer] [facility]

## 2022-04-19 NOTE — Telephone Encounter (Signed)
Prescription refill request for Eliquis received. Indication:afib Last office visit:6/23 Scr:0.7 Age: 82 Weight:68  kg  Prescription refilled

## 2022-04-20 DIAGNOSIS — I110 Hypertensive heart disease with heart failure: Secondary | ICD-10-CM | POA: Diagnosis not present

## 2022-04-20 DIAGNOSIS — I872 Venous insufficiency (chronic) (peripheral): Secondary | ICD-10-CM | POA: Diagnosis not present

## 2022-04-20 DIAGNOSIS — H6121 Impacted cerumen, right ear: Secondary | ICD-10-CM | POA: Diagnosis not present

## 2022-04-20 DIAGNOSIS — R600 Localized edema: Secondary | ICD-10-CM | POA: Diagnosis not present

## 2022-04-20 DIAGNOSIS — I480 Paroxysmal atrial fibrillation: Secondary | ICD-10-CM | POA: Diagnosis not present

## 2022-04-25 ENCOUNTER — Telehealth: Payer: Self-pay | Admitting: Cardiology

## 2022-04-25 NOTE — Telephone Encounter (Signed)
Pt concerned as she in the past has converted to Afib after steroid injections and wants to have a plan in place should this occur. Pt heart rates in normal rhythm are in the upper 50s. Pt can use extra '25mg'$  of metoprolol (for a total of '25mg'$  BID) if she should go into afib and her heart rate is over 100. Pt understands she will only take extra metoprolol should she go into afib. Pt in agreement and verbalized understanding. She will call if issues arise.

## 2022-04-25 NOTE — Telephone Encounter (Signed)
Pt c/o medication issue:  1. Name of Medication: metoprolol succinate (TOPROL-XL) 50 MG 24 hr tablet   2. How are you currently taking this medication (dosage and times per day)? As written   3. Are you having a reaction (difficulty breathing--STAT)? No   4. What is your medication issue? Pt states she was told to increase her metoprolol when she has her cortisone shots but she is unsure how much she should increase. Please advise.

## 2022-05-04 DIAGNOSIS — M25562 Pain in left knee: Secondary | ICD-10-CM | POA: Diagnosis not present

## 2022-05-04 DIAGNOSIS — M25512 Pain in left shoulder: Secondary | ICD-10-CM | POA: Diagnosis not present

## 2022-05-04 DIAGNOSIS — M170 Bilateral primary osteoarthritis of knee: Secondary | ICD-10-CM | POA: Diagnosis not present

## 2022-05-04 DIAGNOSIS — M25511 Pain in right shoulder: Secondary | ICD-10-CM | POA: Diagnosis not present

## 2022-05-12 ENCOUNTER — Encounter (HOSPITAL_COMMUNITY): Payer: Self-pay | Admitting: *Deleted

## 2022-06-08 DIAGNOSIS — M170 Bilateral primary osteoarthritis of knee: Secondary | ICD-10-CM | POA: Diagnosis not present

## 2022-06-15 DIAGNOSIS — M170 Bilateral primary osteoarthritis of knee: Secondary | ICD-10-CM | POA: Diagnosis not present

## 2022-06-20 ENCOUNTER — Other Ambulatory Visit (HOSPITAL_COMMUNITY): Payer: Self-pay | Admitting: Cardiology

## 2022-06-22 DIAGNOSIS — M170 Bilateral primary osteoarthritis of knee: Secondary | ICD-10-CM | POA: Diagnosis not present

## 2022-06-23 DIAGNOSIS — H40013 Open angle with borderline findings, low risk, bilateral: Secondary | ICD-10-CM | POA: Diagnosis not present

## 2022-07-07 DIAGNOSIS — R7989 Other specified abnormal findings of blood chemistry: Secondary | ICD-10-CM | POA: Diagnosis not present

## 2022-07-07 DIAGNOSIS — E039 Hypothyroidism, unspecified: Secondary | ICD-10-CM | POA: Diagnosis not present

## 2022-07-07 DIAGNOSIS — E785 Hyperlipidemia, unspecified: Secondary | ICD-10-CM | POA: Diagnosis not present

## 2022-07-07 DIAGNOSIS — K219 Gastro-esophageal reflux disease without esophagitis: Secondary | ICD-10-CM | POA: Diagnosis not present

## 2022-07-07 DIAGNOSIS — E559 Vitamin D deficiency, unspecified: Secondary | ICD-10-CM | POA: Diagnosis not present

## 2022-07-07 DIAGNOSIS — D649 Anemia, unspecified: Secondary | ICD-10-CM | POA: Diagnosis not present

## 2022-07-14 DIAGNOSIS — I480 Paroxysmal atrial fibrillation: Secondary | ICD-10-CM | POA: Diagnosis not present

## 2022-07-14 DIAGNOSIS — I2510 Atherosclerotic heart disease of native coronary artery without angina pectoris: Secondary | ICD-10-CM | POA: Diagnosis not present

## 2022-07-14 DIAGNOSIS — D6869 Other thrombophilia: Secondary | ICD-10-CM | POA: Diagnosis not present

## 2022-07-14 DIAGNOSIS — E785 Hyperlipidemia, unspecified: Secondary | ICD-10-CM | POA: Diagnosis not present

## 2022-07-14 DIAGNOSIS — I110 Hypertensive heart disease with heart failure: Secondary | ICD-10-CM | POA: Diagnosis not present

## 2022-07-14 DIAGNOSIS — Z23 Encounter for immunization: Secondary | ICD-10-CM | POA: Diagnosis not present

## 2022-07-14 DIAGNOSIS — E039 Hypothyroidism, unspecified: Secondary | ICD-10-CM | POA: Diagnosis not present

## 2022-07-14 DIAGNOSIS — R82998 Other abnormal findings in urine: Secondary | ICD-10-CM | POA: Diagnosis not present

## 2022-07-14 DIAGNOSIS — Z1331 Encounter for screening for depression: Secondary | ICD-10-CM | POA: Diagnosis not present

## 2022-07-14 DIAGNOSIS — G8929 Other chronic pain: Secondary | ICD-10-CM | POA: Diagnosis not present

## 2022-07-14 DIAGNOSIS — D692 Other nonthrombocytopenic purpura: Secondary | ICD-10-CM | POA: Diagnosis not present

## 2022-07-14 DIAGNOSIS — E871 Hypo-osmolality and hyponatremia: Secondary | ICD-10-CM | POA: Diagnosis not present

## 2022-07-14 DIAGNOSIS — Z0000 Encounter for general adult medical examination without abnormal findings: Secondary | ICD-10-CM | POA: Diagnosis not present

## 2022-07-14 DIAGNOSIS — I700 Atherosclerosis of aorta: Secondary | ICD-10-CM | POA: Diagnosis not present

## 2022-07-14 DIAGNOSIS — Z7901 Long term (current) use of anticoagulants: Secondary | ICD-10-CM | POA: Diagnosis not present

## 2022-08-17 DIAGNOSIS — M13862 Other specified arthritis, left knee: Secondary | ICD-10-CM | POA: Diagnosis not present

## 2022-08-17 DIAGNOSIS — M13861 Other specified arthritis, right knee: Secondary | ICD-10-CM | POA: Diagnosis not present

## 2022-08-17 DIAGNOSIS — M25511 Pain in right shoulder: Secondary | ICD-10-CM | POA: Diagnosis not present

## 2022-08-17 DIAGNOSIS — M25512 Pain in left shoulder: Secondary | ICD-10-CM | POA: Diagnosis not present

## 2022-08-24 DIAGNOSIS — E785 Hyperlipidemia, unspecified: Secondary | ICD-10-CM | POA: Diagnosis not present

## 2022-08-25 ENCOUNTER — Telehealth (HOSPITAL_COMMUNITY): Payer: Self-pay | Admitting: *Deleted

## 2022-08-25 MED ORDER — DILTIAZEM HCL 30 MG PO TABS: ORAL_TABLET | ORAL | 1 refills | Status: DC

## 2022-08-25 NOTE — Telephone Encounter (Signed)
Pt converted to Afib on Monday afternoon HRs in the 110s-120s pt did increase her metoprolol to 25mg  BID which she has done in the past while in afib but little rate response. Discussed with Jorja Loa PA will call in PRN cardizem 30mg  tablets to use every 4 hours of elevated rates. If still in AFib next week will bring in for assessment. Pt in agreement.

## 2022-09-01 ENCOUNTER — Ambulatory Visit (HOSPITAL_COMMUNITY)
Admission: RE | Admit: 2022-09-01 | Discharge: 2022-09-01 | Disposition: A | Discharge status: Home / Self Care | Payer: Medicare Other | Source: Ambulatory Visit | Attending: Physician Assistant | Admitting: Physician Assistant

## 2022-09-01 VITALS — BP 160/84 | HR 125 | Ht 64.0 in | Wt 151.4 lb

## 2022-09-01 DIAGNOSIS — D6869 Other thrombophilia: Secondary | ICD-10-CM | POA: Insufficient documentation

## 2022-09-01 DIAGNOSIS — Z7901 Long term (current) use of anticoagulants: Secondary | ICD-10-CM | POA: Diagnosis not present

## 2022-09-01 DIAGNOSIS — G4733 Obstructive sleep apnea (adult) (pediatric): Secondary | ICD-10-CM | POA: Diagnosis not present

## 2022-09-01 DIAGNOSIS — I4819 Other persistent atrial fibrillation: Secondary | ICD-10-CM | POA: Diagnosis not present

## 2022-09-01 DIAGNOSIS — E039 Hypothyroidism, unspecified: Secondary | ICD-10-CM | POA: Diagnosis not present

## 2022-09-01 DIAGNOSIS — I10 Essential (primary) hypertension: Secondary | ICD-10-CM | POA: Diagnosis not present

## 2022-09-01 DIAGNOSIS — Z79899 Other long term (current) drug therapy: Secondary | ICD-10-CM | POA: Insufficient documentation

## 2022-09-01 DIAGNOSIS — I2510 Atherosclerotic heart disease of native coronary artery without angina pectoris: Secondary | ICD-10-CM | POA: Insufficient documentation

## 2022-09-01 LAB — BASIC METABOLIC PANEL
Anion gap: 11 (ref 5–15)
BUN: 20 mg/dL (ref 8–23)
CO2: 25 mmol/L (ref 22–32)
Calcium: 8.9 mg/dL (ref 8.9–10.3)
Chloride: 97 mmol/L — ABNORMAL LOW (ref 98–111)
Creatinine, Ser: 0.67 mg/dL (ref 0.44–1.00)
GFR, Estimated: >60 mL/min (ref >60)
Glucose, Bld: 102 mg/dL — ABNORMAL HIGH (ref 70–99)
Potassium: 3.9 mmol/L (ref 3.5–5.1)
Sodium: 133 mmol/L — ABNORMAL LOW (ref 135–145)

## 2022-09-01 LAB — CBC
HCT: 36.7 % (ref 36.0–46.0)
Hemoglobin: 12 g/dL (ref 12.0–15.0)
MCH: 31.7 pg (ref 26.0–34.0)
MCHC: 32.7 g/dL (ref 30.0–36.0)
MCV: 97.1 fL (ref 80.0–100.0)
Platelets: 320 10*3/uL (ref 150–400)
RBC: 3.78 MIL/uL — ABNORMAL LOW (ref 3.87–5.11)
RDW: 14.3 % (ref 11.5–15.5)
WBC: 16.7 10*3/uL — ABNORMAL HIGH (ref 4.0–10.5)
nRBC: 0 % (ref 0.0–0.2)

## 2022-09-01 MED ORDER — METOPROLOL SUCCINATE ER 50 MG PO TB24: 25.0000 mg | ORAL_TABLET | Freq: Every day | ORAL | 3 refills | Status: DC

## 2022-09-01 NOTE — H&P (View-Only) (Signed)
 Primary Care Physician: Russo, John, MD Referring Physician: [facility] f/u Cardiologist-  Dr.  Hochrein  EP- Dr. Camnitz   Susan Bell is a 82 y.o. female with a h/o HLD, hypothyroidism  that was successfully cardioverted in the [facility] 10/26/17 for new onset Afib. She returned to  the afib office 8/2 but had gone back into  afib and was unaware. She was started on BB for better rate control.   She denied tobacco, alcohol, excessive caffeine. Had been told she snored and appeared to stop breathing at night. Sleep study done 8/30 showed OSA. CPAP started. She had an echo that showed EF at 35% with diffuse hypokinesis. Lexi myoview did not show any ischemia but was an intermediate test.  She was already on an ARB/BB.   She remained in afib, rate controlled. She denied any exertional chest pain, is fatigued and has noted shortness of breath more so with steps. She was admitted for Tikosyn.  However after discharge qt prolonged and Tikosyn was stopped.   Since then she has had a long period of time without any afib until this past Monday. No  specific trigger but afib has been persistent since then. She feels some nausea and fatigue while in afib. Afib in the 90 's today on EKG. Echo in 2020 showed normalization of EF.   F/u afib clinic 11/14/19, following successful cardioversion. ekg today shows  Sinus brady . She feels well, improved in SR. She has had her covid shots a while back.   F/u in afib clinic, 01/02/20. She called to the office last week c/o left leg swelling since August. Rt leg no swelling. Reduced her amlodipine for a few days with mild improvement but still swelling. I scheduled her for an Venous U/S this am which was negative for DVT. Minimal swelling this am. Usually  gets more swelling  during the day and down by the next am. She does wear intermittent support socks. Tries to minimize salt. No pain  associated with the swelling. 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 The U/S   technician mentioned to her that she could see evidence of  incompetent veins.   BP elevated on presentation this am, usually well controlled. On recheck improved to 150/80. She will check again when she gets home.  F/u in afib clinic, 12/15,  for return of afib. Would like  to be cardioverted. No missed anticoagulation. She is in afib in the 90's.  F/u afib clinic, 03/31/20, failed cardioversion, despite multiple shocks. We discussed antiarrythmic's. She has failed tikoyn in the past. She has h/o HF and IVCD so flecainide/Multaq is not an option.   So dicussed amiodarone with pt as it appears to be her only option. I discussed this could be short term bridge  to ablation in the near future as she appears to be a good candidate. But she runs fast in afib so feel she will be better served to get her back in rhythm short term with amiodarone. She is in agreement.   F/u in afib clinic, 04/30/20. She has now been loading on amiodarone x one month so will proceed with cardioversion to see if SR can be restored. Pt is in agreement. No missed doses of anticoagulation.   F/u in afib clinic, 05/18/20. She did have successful cardioversion 05/11/20 and ekg today shows SR.   F/u in afib clinic, 07/22/20. She has been staying in SR with amiodarone but reports that her PCP   is concerned as her TSH is staying elevated. We discussed reducing amiodarone  to 100 mg daily and referring for an ablation to see if amiodarone can be d/c after that.   She is now in the afib clinic, 10/14/20 for f/u ablation over concern for her thyroid status and wanting to stop amiodarone. She is in Sinus brady  today. She reports no swallowing or groin issues. Her BP was elevated in the hospital post procedure and is elevated today on arrival and f/u.   Follow up in the AF clinic 09/01/22. Patient reports that she has been in afib since 5/20 with elevated rates. She was started on PRN diltiazem and increased her daily metoprolol which had  converted her in the past. She remains in rapid afib today. There were no specific triggers that she could identify.   Today, she denies symptoms of chest pain, shortness of breath, orthopnea, PND, lower extremity edema, dizziness, presyncope, syncope, or neurologic sequela. The patient is tolerating medications without difficulties and is otherwise without complaint today.   Past Medical History:  Diagnosis Date   Allergy    seasonal   Anemia    Arthritis    Cataract    bilateral removed   Celiac disease    Eczema    H/O transfusion of platelets    Hyperlipidemia    Hypothyroidism    OSA (obstructive sleep apnea)    Osteopenia    Paroxysmal atrial fibrillation (HCC)    Shingles    Vitamin D deficiency    Past Surgical History:  Procedure Laterality Date   ATRIAL FIBRILLATION ABLATION N/A 09/16/2020   Procedure: ATRIAL FIBRILLATION ABLATION;  Surgeon: Camnitz, Will Martin, MD;  Location: [facility];  Service: Cardiovascular;  Laterality: N/A;   CARDIOVERSION N/A 12/14/2017   Procedure: CARDIOVERSION;  Surgeon: Croitoru, Mihai, MD;  Location: [facility];  Service: Cardiovascular;  Laterality: N/A;   CARDIOVERSION N/A 11/07/2019   Procedure: CARDIOVERSION;  Surgeon: Skains, Mark C, MD;  Location: [facility];  Service: Cardiovascular;  Laterality: N/A;   CARDIOVERSION N/A 03/24/2020   Procedure: CARDIOVERSION;  Surgeon: Pemberton, Heather E, MD;  Location: [facility];  Service: Cardiovascular;  Laterality: N/A;   CARDIOVERSION N/A 05/11/2020   Procedure: CARDIOVERSION;  Surgeon: Ross, Paula V, MD;  Location: [facility];  Service: Cardiovascular;  Laterality: N/A;   CATARACT EXTRACTION     bilateral   COLONOSCOPY     DILATION AND CURETTAGE OF UTERUS     UPPER GI ENDOSCOPY      Current Outpatient Medications  Medication Sig Dispense Refill   acetaminophen (TYLENOL) 500 MG tablet Take 1,000 mg by mouth as needed for mild pain or moderate pain.     alendronate  (FOSAMAX) 70 MG tablet Take 70 mg by mouth every 14 (fourteen) days.     amLODipine (NORVASC) 10 MG tablet TAKE 1/2 TABLET BY MOUTH DAILY 45 tablet 1   apixaban (ELIQUIS) 5 MG TABS tablet Take 1 tablet (5 mg total) by mouth 2 (two) times daily. 180 tablet 1   Ascorbic Acid (VITAMIN C) 500 MG CAPS Take 500 mg by mouth daily.     brimonidine (ALPHAGAN) 0.15 % ophthalmic solution Place 1 drop into the left eye in the morning and at bedtime.     Cholecalciferol (VITAMIN D3) 10 MCG (400 UNIT) CAPS Take 400 Units by mouth daily with breakfast.     diltiazem (CARDIZEM) 30 MG tablet Take 1 tablet every 4 hours AS NEEDED for AFIB heart rate >  100 as long as top BP >100. 30 tablet 1   dorzolamide-timolol (COSOPT) 22.3-6.8 MG/ML ophthalmic solution Place 1 drop into the left eye in the morning and at bedtime.     fluorometholone (FML) 0.1 % ophthalmic suspension Place 1 drop into the left eye in the morning.     hydrocortisone (ANUSOL-HC) 2.5 % rectal cream Place 1 application rectally 2 (two) times daily as needed for hemorrhoids.     levothyroxine (SYNTHROID) 88 MCG tablet Take 88 mcg by mouth daily before breakfast.     loratadine (CLARITIN) 10 MG tablet Take 10 mg by mouth daily as needed for allergies or rhinitis.     losartan (COZAAR) 50 MG tablet TAKE 1 TABLET BY MOUTH EVERY DAY 90 tablet 3   meclizine (ANTIVERT) 25 MG tablet Take 1 tablet (25 mg total) by mouth 3 (three) times daily as needed for dizziness. 30 tablet 0   Menthol-Methyl Salicylate (SALONPAS PAIN RELIEF PATCH EX) Apply 1 patch topically daily as needed (for pain).     Multiple Vitamins-Minerals (ONE-A-DAY WOMENS 50+ ADVANTAGE) TABS Take 1 tablet by mouth daily with breakfast.     PATADAY 0.2 % SOLN Place 1 drop into both eyes daily as needed (allergies).      PRILOSEC OTC 20 MG tablet Take 20 mg by mouth at bedtime.     rosuvastatin (CRESTOR) 10 MG tablet Take 10 mg by mouth at bedtime.  3   SYSTANE ULTRA 0.4-0.3 % SOLN Place 1 drop  into both eyes daily as needed (dry eyes).      traMADol (ULTRAM) 50 MG tablet Take 50 mg by mouth every 6 (six) hours as needed for moderate pain.     urea (URE-NA) 15 g PACK oral packet Take 15 g by mouth 2 (two) times daily. (Patient taking differently: Taking 2 packets twice weekly) 60 packet 0   venlafaxine (EFFEXOR) 37.5 MG tablet TAKE 1 TABLET WITH FOOD ORALLY ONCE A DAY FOR ANXIETY/MOOD     metoprolol succinate (TOPROL-XL) 50 MG 24 hr tablet Take 0.5 tablets (25 mg total) by mouth daily. May take extra 1/2 tablet daily for breakthrough afib 90 tablet 3   No current facility-administered medications for this encounter.    Allergies  Allergen Reactions   Atorvastatin Other (See Comments)    Knee pain   Ferrex [Iron Polysaccharide] Other (See Comments)    Severe constipation    Gluten Meal Other (See Comments)   Povidone Iodine Itching and Other (See Comments)    With prolonged usage, itching   Povidone-Iodine Itching    Social History   Socioeconomic History   Marital status: Widowed    Spouse name: Not on file   Number of children: 4   Years of education: Not on file   Highest education level: Not on file  Occupational History   Occupation: retired    Employer: RETIRED  Tobacco Use   Smoking status: Never   Smokeless tobacco: Never  Vaping Use   Vaping Use: Never used  Substance and Sexual Activity   Alcohol use: No    Alcohol/week: 0.0 standard drinks of alcohol   Drug use: No   Sexual activity: Not on file  Other Topics Concern   Not on file  Social History Narrative   Daily caffeine   Social Determinants of Health   Financial Resource Strain: Not on file  Food Insecurity: Not on file  Transportation Needs: Not on file  Physical Activity: Not on file  Stress: Not   on file  Social Connections: Not on file  Intimate Partner Violence: Not on file    Family History  Problem Relation Age of Onset   Liver cancer Mother    Diabetes Father    Heart  disease Father    Colon cancer Neg Hx     ROS- All systems are reviewed and negative except as per the HPI above  Physical Exam: Vitals:   09/01/22 0837  BP: (!) 160/84  Pulse: (!) 125  Weight: 68.7 kg  Height: 5' 4" (1.626 m)    Wt Readings from Last 3 Encounters:  09/01/22 68.7 kg  09/24/21 68 kg  09/14/21 68 kg    Labs: Lab Results  Component Value Date   NA 131 (L) 09/24/2021   K 4.2 09/24/2021   CL 97 (L) 09/24/2021   CO2 26 09/24/2021   GLUCOSE 106 (H) 09/24/2021   BUN 29 (H) 09/24/2021   CREATININE 0.79 09/24/2021   CALCIUM 9.8 09/24/2021   PHOS 3.6 05/10/2021   MG 2.1 05/08/2021   No results found for: "INR" Lab Results  Component Value Date   CHOL 216 (H) 05/07/2021   HDL 89 05/07/2021   LDLCALC 115 (H) 05/07/2021   TRIG 61 05/07/2021     GEN- The patient is a well appearing elderly female, alert and oriented x 3 today.   HEENT-head normocephalic, atraumatic, sclera clear, conjunctiva pink, hearing intact, trachea midline. Lungs- Clear to ausculation bilaterally, normal work of breathing Heart- irregular rate and rhythm, no murmurs, rubs or gallops  GI- soft, NT, ND, + BS Extremities- no clubbing, cyanosis, or edema MS- no significant deformity or atrophy Skin- no rash or lesion Psych- euthymic mood, full affect Neuro- strength and sensation are intact   EKG today demonstrates Afib with RVR, IVCD Vent. rate 125 BPM PR interval * ms QRS duration 128 ms QT/QTcB 338/487 ms   Echo- 10/24/18  1. The left ventricle has normal systolic function with an ejection fraction of 60-65%. The cavity size was normal. There is mild concentric  left ventricular hypertrophy. Left ventricular diastolic Doppler  parameters are consistent with pseudonormalization. Elevated mean left atrial pressure.   2. The right ventricle has normal systolic function. The cavity was  normal. There is no increase in right ventricular wall thickness. Right  ventricular  systolic pressure is normal with an estimated pressure of 36.9  mmHg.   3. Left atrial size was mildly dilated.   4. There is mild to moderate mitral annular calcification present.   5. The aorta is normal in size and structure.     CHA2DS2-VASc Score = 5  The patient's score is based upon: CHF History: 0 HTN History: 1 Diabetes History: 0 Stroke History: 0 Vascular Disease History: 1 Age Score: 2 Gender Score: 1       ASSESSMENT AND PLAN: 1. Persistent Atrial Fibrillation (ICD10:  I48.19) The patient's CHA2DS2-VASc score is 5, indicating a 7.2% annual risk of stroke.   S/p ablation 09/16/20 Previously failed dofetilide. Amiodarone discontinued due to thyroid dysfunction. Patient appears to be persistently in afib. We discussed rhythm control options. Will arrange for DCCV.  Continue metoprolol succinate 25 mg BID until DCCV then return to daily dosing.  Continue Eliquis 5 mg BID, no missed doses in the past 3 weeks.  Continue diltiazem 30 mg PRN q 4 hours for heart racing.   2. Secondary Hypercoagulable State (ICD10:  D68.69) The patient is at significant risk for stroke/thromboembolism based upon her CHA2DS2-VASc Score   of 5.  Continue Apixaban (Eliquis).   3. HTN Elevated today, will reassess in SR.   4. OSA Encouraged compliance with CPAP therapy.  5. CAD CAC score 424 on CT No anginal symptoms. On statin   Follow up in the AF clinic post DCCV.    Ricky Sanjna Haskew PA-C [facility] [address] [place], [place] [address] [phone] 

## 2022-09-01 NOTE — Patient Instructions (Signed)
Return to normal dosing of metoprolol day of cardioversion   Cardioversion scheduled for: Tuesday, June 4th   - Arrive at the [facility] and go to admitting at 12pm   - Do not eat or drink anything after midnight the night prior to your procedure.   - Take all your morning medication (except diabetic medications) with a sip of water prior to arrival.  - You will not be able to drive home after your procedure.    - Do NOT miss any doses of your blood thinner - if you should miss a dose please notify our office immediately.   - If you feel as if you go back into normal rhythm prior to scheduled cardioversion, please notify our office immediately.   If your procedure is canceled in the cardioversion suite you will be charged a cancellation fee.

## 2022-09-01 NOTE — Progress Notes (Signed)
Primary Care Physician: Creola Corn, MD Referring Physician: [facility] ER f/u Cardiologist-  Dr.  Antoine Poche  EP- Dr. Mariel Kansky is a 82 y.o. female with a h/o HLD, hypothyroidism  that was successfully cardioverted in the [facility] ER 10/26/17 for new onset Afib. She returned to  the afib office 8/2 but had gone back into  afib and was unaware. She was started on BB for better rate control.   She denied tobacco, alcohol, excessive caffeine. Had been told she snored and appeared to stop breathing at night. Sleep study done 8/30 showed OSA. CPAP started. She had an echo that showed EF at 35% with diffuse hypokinesis. Lexi myoview did not show any ischemia but was an intermediate test.  She was already on an ARB/BB.   She remained in afib, rate controlled. She denied any exertional chest pain, is fatigued and has noted shortness of breath more so with steps. She was admitted for Tikosyn.  However after discharge qt prolonged and Tikosyn was stopped.   Since then she has had a long period of time without any afib until this past Monday. No  specific trigger but afib has been persistent since then. She feels some nausea and fatigue while in afib. Afib in the 33 's today on EKG. Echo in 2020 showed normalization of EF.   F/u afib clinic 11/14/19, following successful cardioversion. ekg today shows  Sinus brady . She feels well, improved in SR. She has had her covid shots a while back.   F/u in afib clinic, 01/02/20. She called to the office last week c/o left leg swelling since August. Rt leg no swelling. Reduced her amlodipine for a few days with mild improvement but still swelling. I scheduled her for an Venous U/S this am which was negative for DVT. Minimal swelling this am. Usually  gets more swelling  during the day and down by the next am. She does wear intermittent support socks. Tries to minimize salt. No pain  associated with the swelling. 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 The U/S   technician mentioned to her that she could see evidence of  incompetent veins.   BP elevated on presentation this am, usually well controlled. On recheck improved to 150/80. She will check again when she gets home.  F/u in afib clinic, 12/15,  for return of afib. Would like  to be cardioverted. No missed anticoagulation. She is in afib in the 90's.  F/u afib clinic, 03/31/20, failed cardioversion, despite multiple shocks. We discussed antiarrythmic's. She has failed tikoyn in the past. She has h/o HF and IVCD so flecainide/Multaq is not an option.   So dicussed amiodarone with pt as it appears to be her only option. I discussed this could be short term bridge  to ablation in the near future as she appears to be a good candidate. But she runs fast in afib so feel she will be better served to get her back in rhythm short term with amiodarone. She is in agreement.   F/u in afib clinic, 04/30/20. She has now been loading on amiodarone x one month so will proceed with cardioversion to see if SR can be restored. Pt is in agreement. No missed doses of anticoagulation.   F/u in afib clinic, 05/18/20. She did have successful cardioversion 05/11/20 and ekg today shows SR.   F/u in afib clinic, 07/22/20. She has been staying in SR with amiodarone but reports that her PCP  is concerned as her TSH is staying elevated. We discussed reducing amiodarone  to 100 mg daily and referring for an ablation to see if amiodarone can be d/c after that.   She is now in the afib clinic, 10/14/20 for f/u ablation over concern for her thyroid status and wanting to stop amiodarone. She is in Sinus brady  today. She reports no swallowing or groin issues. Her BP was elevated in the hospital post procedure and is elevated today on arrival and f/u.   Follow up in the AF clinic 09/01/22. Patient reports that she has been in afib since 5/20 with elevated rates. She was started on PRN diltiazem and increased her daily metoprolol which had  converted her in the past. She remains in rapid afib today. There were no specific triggers that she could identify.   Today, she denies symptoms of chest pain, shortness of breath, orthopnea, PND, lower extremity edema, dizziness, presyncope, syncope, or neurologic sequela. The patient is tolerating medications without difficulties and is otherwise without complaint today.   Past Medical History:  Diagnosis Date   Allergy    seasonal   Anemia    Arthritis    Cataract    bilateral removed   Celiac disease    Eczema    H/O transfusion of platelets    Hyperlipidemia    Hypothyroidism    OSA (obstructive sleep apnea)    Osteopenia    Paroxysmal atrial fibrillation (HCC)    Shingles    Vitamin D deficiency    Past Surgical History:  Procedure Laterality Date   ATRIAL FIBRILLATION ABLATION N/A 09/16/2020   Procedure: ATRIAL FIBRILLATION ABLATION;  Surgeon: Regan Lemming, MD;  Location: [facility] INVASIVE CV LAB;  Service: Cardiovascular;  Laterality: N/A;   CARDIOVERSION N/A 12/14/2017   Procedure: CARDIOVERSION;  Surgeon: Thurmon Fair, MD;  Location: [facility] ENDOSCOPY;  Service: Cardiovascular;  Laterality: N/A;   CARDIOVERSION N/A 11/07/2019   Procedure: CARDIOVERSION;  Surgeon: Jake Bathe, MD;  Location: [facility] ENDOSCOPY;  Service: Cardiovascular;  Laterality: N/A;   CARDIOVERSION N/A 03/24/2020   Procedure: CARDIOVERSION;  Surgeon: Meriam Sprague, MD;  Location: [facility] ENDOSCOPY;  Service: Cardiovascular;  Laterality: N/A;   CARDIOVERSION N/A 05/11/2020   Procedure: CARDIOVERSION;  Surgeon: Pricilla Riffle, MD;  Location: [facility] ENDOSCOPY;  Service: Cardiovascular;  Laterality: N/A;   CATARACT EXTRACTION     bilateral   COLONOSCOPY     DILATION AND CURETTAGE OF UTERUS     UPPER GI ENDOSCOPY      Current Outpatient Medications  Medication Sig Dispense Refill   acetaminophen (TYLENOL) 500 MG tablet Take 1,000 mg by mouth as needed for mild pain or moderate pain.     alendronate  (FOSAMAX) 70 MG tablet Take 70 mg by mouth every 14 (fourteen) days.     amLODipine (NORVASC) 10 MG tablet TAKE 1/2 TABLET BY MOUTH DAILY 45 tablet 1   apixaban (ELIQUIS) 5 MG TABS tablet Take 1 tablet (5 mg total) by mouth 2 (two) times daily. 180 tablet 1   Ascorbic Acid (VITAMIN C) 500 MG CAPS Take 500 mg by mouth daily.     brimonidine (ALPHAGAN) 0.15 % ophthalmic solution Place 1 drop into the left eye in the morning and at bedtime.     Cholecalciferol (VITAMIN D3) 10 MCG (400 UNIT) CAPS Take 400 Units by mouth daily with breakfast.     diltiazem (CARDIZEM) 30 MG tablet Take 1 tablet every 4 hours AS NEEDED for AFIB heart rate >  100 as long as top BP >100. 30 tablet 1   dorzolamide-timolol (COSOPT) 22.3-6.8 MG/ML ophthalmic solution Place 1 drop into the left eye in the morning and at bedtime.     fluorometholone (FML) 0.1 % ophthalmic suspension Place 1 drop into the left eye in the morning.     hydrocortisone (ANUSOL-HC) 2.5 % rectal cream Place 1 application rectally 2 (two) times daily as needed for hemorrhoids.     levothyroxine (SYNTHROID) 88 MCG tablet Take 88 mcg by mouth daily before breakfast.     loratadine (CLARITIN) 10 MG tablet Take 10 mg by mouth daily as needed for allergies or rhinitis.     losartan (COZAAR) 50 MG tablet TAKE 1 TABLET BY MOUTH EVERY DAY 90 tablet 3   meclizine (ANTIVERT) 25 MG tablet Take 1 tablet (25 mg total) by mouth 3 (three) times daily as needed for dizziness. 30 tablet 0   Menthol-Methyl Salicylate (SALONPAS PAIN RELIEF PATCH EX) Apply 1 patch topically daily as needed (for pain).     Multiple Vitamins-Minerals (ONE-A-DAY WOMENS 50+ ADVANTAGE) TABS Take 1 tablet by mouth daily with breakfast.     PATADAY 0.2 % SOLN Place 1 drop into both eyes daily as needed (allergies).      PRILOSEC OTC 20 MG tablet Take 20 mg by mouth at bedtime.     rosuvastatin (CRESTOR) 10 MG tablet Take 10 mg by mouth at bedtime.  3   SYSTANE ULTRA 0.4-0.3 % SOLN Place 1 drop  into both eyes daily as needed (dry eyes).      traMADol (ULTRAM) 50 MG tablet Take 50 mg by mouth every 6 (six) hours as needed for moderate pain.     urea (URE-NA) 15 g PACK oral packet Take 15 g by mouth 2 (two) times daily. (Patient taking differently: Taking 2 packets twice weekly) 60 packet 0   venlafaxine (EFFEXOR) 37.5 MG tablet TAKE 1 TABLET WITH FOOD ORALLY ONCE A DAY FOR ANXIETY/MOOD     metoprolol succinate (TOPROL-XL) 50 MG 24 hr tablet Take 0.5 tablets (25 mg total) by mouth daily. May take extra 1/2 tablet daily for breakthrough afib 90 tablet 3   No current facility-administered medications for this encounter.    Allergies  Allergen Reactions   Atorvastatin Other (See Comments)    Knee pain   Estelle June [Iron Polysaccharide] Other (See Comments)    Severe constipation    Gluten Meal Other (See Comments)   Povidone Iodine Itching and Other (See Comments)    With prolonged usage, itching   Povidone-Iodine Itching    Social History   Socioeconomic History   Marital status: Widowed    Spouse name: Not on file   Number of children: 4   Years of education: Not on file   Highest education level: Not on file  Occupational History   Occupation: retired    Associate Professor: RETIRED  Tobacco Use   Smoking status: Never   Smokeless tobacco: Never  Vaping Use   Vaping Use: Never used  Substance and Sexual Activity   Alcohol use: No    Alcohol/week: 0.0 standard drinks of alcohol   Drug use: No   Sexual activity: Not on file  Other Topics Concern   Not on file  Social History Narrative   Daily caffeine   Social Determinants of Health   Financial Resource Strain: Not on file  Food Insecurity: Not on file  Transportation Needs: Not on file  Physical Activity: Not on file  Stress: Not  on file  Social Connections: Not on file  Intimate Partner Violence: Not on file    Family History  Problem Relation Age of Onset   Liver cancer Mother    Diabetes Father    Heart  disease Father    Colon cancer Neg Hx     ROS- All systems are reviewed and negative except as per the HPI above  Physical Exam: Vitals:   09/01/22 0837  BP: (!) 160/84  Pulse: (!) 125  Weight: 68.7 kg  Height: 5\' 4"  (1.626 m)    Wt Readings from Last 3 Encounters:  09/01/22 68.7 kg  09/24/21 68 kg  09/14/21 68 kg    Labs: Lab Results  Component Value Date   NA 131 (L) 09/24/2021   K 4.2 09/24/2021   CL 97 (L) 09/24/2021   CO2 26 09/24/2021   GLUCOSE 106 (H) 09/24/2021   BUN 29 (H) 09/24/2021   CREATININE 0.79 09/24/2021   CALCIUM 9.8 09/24/2021   PHOS 3.6 05/10/2021   MG 2.1 05/08/2021   No results found for: "INR" Lab Results  Component Value Date   CHOL 216 (H) 05/07/2021   HDL 89 05/07/2021   LDLCALC 115 (H) 05/07/2021   TRIG 61 05/07/2021     GEN- The patient is a well appearing elderly female, alert and oriented x 3 today.   HEENT-head normocephalic, atraumatic, sclera clear, conjunctiva pink, hearing intact, trachea midline. Lungs- Clear to ausculation bilaterally, normal work of breathing Heart- irregular rate and rhythm, no murmurs, rubs or gallops  GI- soft, NT, ND, + BS Extremities- no clubbing, cyanosis, or edema MS- no significant deformity or atrophy Skin- no rash or lesion Psych- euthymic mood, full affect Neuro- strength and sensation are intact   EKG today demonstrates Afib with RVR, IVCD Vent. rate 125 BPM PR interval * ms QRS duration 128 ms QT/QTcB 338/487 ms   Echo- 10/24/18  1. The left ventricle has normal systolic function with an ejection fraction of 60-65%. The cavity size was normal. There is mild concentric  left ventricular hypertrophy. Left ventricular diastolic Doppler  parameters are consistent with pseudonormalization. Elevated mean left atrial pressure.   2. The right ventricle has normal systolic function. The cavity was  normal. There is no increase in right ventricular wall thickness. Right  ventricular  systolic pressure is normal with an estimated pressure of 36.9  mmHg.   3. Left atrial size was mildly dilated.   4. There is mild to moderate mitral annular calcification present.   5. The aorta is normal in size and structure.     CHA2DS2-VASc Score = 5  The patient's score is based upon: CHF History: 0 HTN History: 1 Diabetes History: 0 Stroke History: 0 Vascular Disease History: 1 Age Score: 2 Gender Score: 1       ASSESSMENT AND PLAN: 1. Persistent Atrial Fibrillation (ICD10:  I48.19) The patient's CHA2DS2-VASc score is 5, indicating a 7.2% annual risk of stroke.   S/p ablation 09/16/20 Previously failed dofetilide. Amiodarone discontinued due to thyroid dysfunction. Patient appears to be persistently in afib. We discussed rhythm control options. Will arrange for DCCV.  Continue metoprolol succinate 25 mg BID until DCCV then return to daily dosing.  Continue Eliquis 5 mg BID, no missed doses in the past 3 weeks.  Continue diltiazem 30 mg PRN q 4 hours for heart racing.   2. Secondary Hypercoagulable State (ICD10:  D68.69) The patient is at significant risk for stroke/thromboembolism based upon her CHA2DS2-VASc Score  of 5.  Continue Apixaban (Eliquis).   3. HTN Elevated today, will reassess in SR.   4. OSA Encouraged compliance with CPAP therapy.  5. CAD CAC score 424 on CT No anginal symptoms. On statin   Follow up in the AF clinic post DCCV.    Jorja Loa PA-C Afib Clinic [facility] [address] [place], [place] [address] [phone]

## 2022-09-05 NOTE — Progress Notes (Signed)
Spoke to pt and stated that pt needs to come at 1130 and be NPO after 0000. Stated that the pt needs a ride home and a responsible person to stay with them for 24 hours. Confirmed no missed doses of eliquis in the past month.

## 2022-09-06 ENCOUNTER — Ambulatory Visit (HOSPITAL_BASED_OUTPATIENT_CLINIC_OR_DEPARTMENT_OTHER): Payer: Medicare Other | Admitting: Registered Nurse

## 2022-09-06 ENCOUNTER — Encounter (HOSPITAL_COMMUNITY)
Admission: RE | Disposition: A | Discharge status: Home / Self Care | Payer: Self-pay | Source: Home / Self Care | Attending: Cardiology

## 2022-09-06 ENCOUNTER — Ambulatory Visit (HOSPITAL_COMMUNITY)
Admission: RE | Admit: 2022-09-06 | Discharge: 2022-09-06 | Disposition: A | Discharge status: Home / Self Care | Payer: Medicare Other | Attending: Cardiology | Admitting: Cardiology

## 2022-09-06 ENCOUNTER — Ambulatory Visit (HOSPITAL_COMMUNITY): Payer: Medicare Other | Admitting: Registered Nurse

## 2022-09-06 ENCOUNTER — Other Ambulatory Visit: Payer: Self-pay

## 2022-09-06 ENCOUNTER — Encounter (HOSPITAL_COMMUNITY): Payer: Self-pay | Admitting: Cardiology

## 2022-09-06 DIAGNOSIS — I2510 Atherosclerotic heart disease of native coronary artery without angina pectoris: Secondary | ICD-10-CM | POA: Diagnosis not present

## 2022-09-06 DIAGNOSIS — I10 Essential (primary) hypertension: Secondary | ICD-10-CM | POA: Insufficient documentation

## 2022-09-06 DIAGNOSIS — I4819 Other persistent atrial fibrillation: Secondary | ICD-10-CM | POA: Insufficient documentation

## 2022-09-06 DIAGNOSIS — I4891 Unspecified atrial fibrillation: Secondary | ICD-10-CM | POA: Diagnosis not present

## 2022-09-06 DIAGNOSIS — G4733 Obstructive sleep apnea (adult) (pediatric): Secondary | ICD-10-CM | POA: Diagnosis not present

## 2022-09-06 DIAGNOSIS — E039 Hypothyroidism, unspecified: Secondary | ICD-10-CM

## 2022-09-06 DIAGNOSIS — Z79899 Other long term (current) drug therapy: Secondary | ICD-10-CM | POA: Diagnosis not present

## 2022-09-06 DIAGNOSIS — Z7901 Long term (current) use of anticoagulants: Secondary | ICD-10-CM | POA: Diagnosis not present

## 2022-09-06 DIAGNOSIS — E785 Hyperlipidemia, unspecified: Secondary | ICD-10-CM | POA: Insufficient documentation

## 2022-09-06 DIAGNOSIS — Z9989 Dependence on other enabling machines and devices: Secondary | ICD-10-CM | POA: Diagnosis not present

## 2022-09-06 DIAGNOSIS — D6869 Other thrombophilia: Secondary | ICD-10-CM | POA: Insufficient documentation

## 2022-09-06 HISTORY — PX: CARDIOVERSION: SHX1299

## 2022-09-06 SURGERY — CARDIOVERSION
Anesthesia: General

## 2022-09-06 MED ORDER — PROPOFOL 10 MG/ML IV BOLUS
INTRAVENOUS | Status: DC | PRN
  Administered 2022-09-06: 70 mg via INTRAVENOUS

## 2022-09-06 MED ORDER — SODIUM CHLORIDE 0.9 % IV SOLN: INTRAVENOUS | Status: DC

## 2022-09-06 MED ORDER — LIDOCAINE 2% (20 MG/ML) 5 ML SYRINGE
INTRAMUSCULAR | Status: DC | PRN
  Administered 2022-09-06: 60 mg via INTRAVENOUS

## 2022-09-06 SURGICAL SUPPLY — 1 items: ELECT DEFIB PAD ADLT CADENCE (PAD) ×2 IMPLANT

## 2022-09-06 NOTE — Interval H&P Note (Signed)
History and Physical Interval Note:  09/06/2022 11:40 AM  Susan Bell  has presented today for surgery, with the diagnosis of AFIB.  The various methods of treatment have been discussed with the patient and family. After consideration of risks, benefits and other options for treatment, the patient has consented to  Procedure(s): CARDIOVERSION (N/A) as a surgical intervention.  The patient's history has been reviewed, patient examined, no change in status, stable for surgery.  I have reviewed the patient's chart and labs.  Questions were answered to the patient's satisfaction.     [name]

## 2022-09-06 NOTE — Transfer of Care (Signed)
Immediate Anesthesia Transfer of Care Note  Patient: Susan Bell  Procedure(s) Performed: CARDIOVERSION  Patient Location: PACU and Cath Lab  Anesthesia Type:MAC  Level of Consciousness: awake, alert , and oriented  Airway & Oxygen Therapy: Patient Spontanous Breathing  Post-op Assessment: Report given to RN and Post -op Vital signs reviewed and stable  Post vital signs: Reviewed and stable  Last Vitals:  Vitals Value Taken Time  BP 123/54   Temp    Pulse 117 09/06/22 1222  Resp 19 09/06/22 1222  SpO2 85 % 09/06/22 1222  Vitals shown include unvalidated device data.  Last Pain:  Vitals:   09/06/22 1123  TempSrc: Temporal         Complications: There were no known notable events for this encounter.

## 2022-09-06 NOTE — Anesthesia Preprocedure Evaluation (Addendum)
Anesthesia Evaluation  Patient identified by MRN, date of birth, ID band Patient awake    Reviewed: Allergy & Precautions, NPO status , Patient's Chart, lab work & pertinent test results  History of Anesthesia Complications Negative for: history of anesthetic complications  Airway Mallampati: II  TM Distance: >3 FB Neck ROM: Full    Dental  (+) Dental Advisory Given   Pulmonary sleep apnea and Continuous Positive Airway Pressure Ventilation    Pulmonary exam normal        Cardiovascular hypertension, Pt. on medications + dysrhythmias Atrial Fibrillation  Rhythm:Irregular Rate:Tachycardia     Neuro/Psych negative neurological ROS  negative psych ROS   GI/Hepatic Neg liver ROS,GERD  Controlled,, Celiac disease    Endo/Other  Hypothyroidism    Renal/GU negative Renal ROS     Musculoskeletal  (+) Arthritis ,    Abdominal   Peds  Hematology  On eliquis    Anesthesia Other Findings   Reproductive/Obstetrics                             Anesthesia Physical Anesthesia Plan  ASA: 3  Anesthesia Plan: General   Post-op Pain Management: Minimal or no pain anticipated   Induction: Intravenous  PONV Risk Score and Plan: 3 and Treatment may vary due to age or medical condition and Propofol infusion  Airway Management Planned: Natural Airway and Mask  Additional Equipment: None  Intra-op Plan:   Post-operative Plan:   Informed Consent: I have reviewed the patients History and Physical, chart, labs and discussed the procedure including the risks, benefits and alternatives for the proposed anesthesia with the patient or authorized representative who has indicated his/her understanding and acceptance.       Plan Discussed with: CRNA and Anesthesiologist  Anesthesia Plan Comments:         Anesthesia Quick Evaluation

## 2022-09-06 NOTE — Anesthesia Postprocedure Evaluation (Signed)
Anesthesia Post Note  Patient: Susan Bell  Procedure(s) Performed: CARDIOVERSION     Patient location during evaluation: PACU Anesthesia Type: General Level of consciousness: awake and alert Pain management: pain level controlled Vital Signs Assessment: post-procedure vital signs reviewed and stable Respiratory status: spontaneous breathing, nonlabored ventilation and respiratory function stable Cardiovascular status: stable and blood pressure returned to baseline Anesthetic complications: no   There were no known notable events for this encounter.  Last Vitals:  Vitals:   09/06/22 1239 09/06/22 1250  BP: 137/62 (!) 151/66  Pulse: (!) 56 62  Resp: 16 18  Temp: 36.7 C   SpO2: 99% 99%    Last Pain:  Vitals:   09/06/22 1239  TempSrc: Temporal  PainSc: 0-No pain                 Beryle Lathe

## 2022-09-06 NOTE — CV Procedure (Addendum)
Procedure:   DCCV  Indication:  Symptomatic atrial fibrillation  Procedure Note:  The patient signed informed consent.  They have had had therapeutic anticoagulation with Eliquis greater than 3 weeks.  Anesthesia was administered by Dr. Mal Amabile and Sharyn Dross, CRNA.  Adequate airway was maintained throughout and vital followed per protocol.  They were cardioverted x 1 with 200J of biphasic synchronized energy.  They converted to NSR with rate 60s.  There were no apparent complications.  The patient had normal neuro status and respiratory status post procedure with vitals stable as recorded elsewhere.    Follow up:  They will continue on current medical therapy and follow up with cardiology as scheduled.  Epifanio Lesches, MD 09/06/2022 12:34 PM

## 2022-09-07 ENCOUNTER — Encounter (HOSPITAL_COMMUNITY): Payer: Self-pay | Admitting: Cardiology

## 2022-09-09 ENCOUNTER — Ambulatory Visit (HOSPITAL_COMMUNITY): Payer: Medicare Other | Admitting: Physician Assistant

## 2022-09-16 ENCOUNTER — Other Ambulatory Visit (HOSPITAL_COMMUNITY): Payer: Self-pay | Admitting: Cardiology

## 2022-09-19 ENCOUNTER — Encounter (HOSPITAL_COMMUNITY): Payer: Self-pay | Admitting: Physician Assistant

## 2022-09-19 ENCOUNTER — Ambulatory Visit (HOSPITAL_COMMUNITY)
Admission: RE | Admit: 2022-09-19 | Discharge: 2022-09-19 | Disposition: A | Discharge status: Home / Self Care | Payer: Medicare Other | Source: Ambulatory Visit | Attending: Physician Assistant | Admitting: Physician Assistant

## 2022-09-19 VITALS — BP 168/72 | HR 62 | Ht 64.0 in | Wt 152.6 lb

## 2022-09-19 DIAGNOSIS — E785 Hyperlipidemia, unspecified: Secondary | ICD-10-CM | POA: Diagnosis not present

## 2022-09-19 DIAGNOSIS — G4733 Obstructive sleep apnea (adult) (pediatric): Secondary | ICD-10-CM | POA: Diagnosis not present

## 2022-09-19 DIAGNOSIS — I440 Atrioventricular block, first degree: Secondary | ICD-10-CM | POA: Insufficient documentation

## 2022-09-19 DIAGNOSIS — I4819 Other persistent atrial fibrillation: Secondary | ICD-10-CM | POA: Insufficient documentation

## 2022-09-19 DIAGNOSIS — E039 Hypothyroidism, unspecified: Secondary | ICD-10-CM | POA: Insufficient documentation

## 2022-09-19 DIAGNOSIS — I119 Hypertensive heart disease without heart failure: Secondary | ICD-10-CM | POA: Diagnosis not present

## 2022-09-19 DIAGNOSIS — Z79899 Other long term (current) drug therapy: Secondary | ICD-10-CM | POA: Diagnosis not present

## 2022-09-19 DIAGNOSIS — Z7901 Long term (current) use of anticoagulants: Secondary | ICD-10-CM | POA: Insufficient documentation

## 2022-09-19 DIAGNOSIS — D6869 Other thrombophilia: Secondary | ICD-10-CM | POA: Diagnosis not present

## 2022-09-19 DIAGNOSIS — I2510 Atherosclerotic heart disease of native coronary artery without angina pectoris: Secondary | ICD-10-CM | POA: Diagnosis not present

## 2022-09-19 MED ORDER — APIXABAN 5 MG PO TABS
5.0000 mg | ORAL_TABLET | Freq: Two times a day (BID) | ORAL | 2 refills | Status: DC
Start: 2022-09-19 — End: 2023-08-08

## 2022-09-19 NOTE — Progress Notes (Signed)
Primary Care Physician: Creola Corn, MD Referring Physician: [facility] ER f/u Cardiologist-  Dr.  Antoine Poche  EP- Dr. Mariel Kansky is a 82 y.o. female with a h/o HLD, hypothyroidism  that was successfully cardioverted in the [facility] ER 10/26/17 for new onset Afib. She returned to  the afib office 8/2 but had gone back into  afib and was unaware. She was started on BB for better rate control.   She denied tobacco, alcohol, excessive caffeine. Had been told she snored and appeared to stop breathing at night. Sleep study done 8/30 showed OSA. CPAP started. She had an echo that showed EF at 35% with diffuse hypokinesis. Lexi myoview did not show any ischemia but was an intermediate test.  She was already on an ARB/BB.   She remained in afib, rate controlled. She denied any exertional chest pain, is fatigued and has noted shortness of breath more so with steps. She was admitted for Tikosyn.  However after discharge qt prolonged and Tikosyn was stopped.   Since then she has had a long period of time without any afib until this past Monday. No  specific trigger but afib has been persistent since then. She feels some nausea and fatigue while in afib. Afib in the 33 's today on EKG. Echo in 2020 showed normalization of EF.   F/u afib clinic 11/14/19, following successful cardioversion. ekg today shows  Sinus brady . She feels well, improved in SR. She has had her covid shots a while back.   F/u in afib clinic, 01/02/20. She called to the office last week c/o left leg swelling since August. Rt leg no swelling. Reduced her amlodipine for a few days with mild improvement but still swelling. I scheduled her for an Venous U/S this am which was negative for DVT. Minimal swelling this am. Usually  gets more swelling  during the day and down by the next am. She does wear intermittent support socks. Tries to minimize salt. No pain  associated with the swelling. 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 The U/S   technician mentioned to her that she could see evidence of  incompetent veins.   BP elevated on presentation this am, usually well controlled. On recheck improved to 150/80. She will check again when she gets home.  F/u in afib clinic, 12/15,  for return of afib. Would like  to be cardioverted. No missed anticoagulation. She is in afib in the 90's.  F/u afib clinic, 03/31/20, failed cardioversion, despite multiple shocks. We discussed antiarrythmic's. She has failed tikoyn in the past. She has h/o HF and IVCD so flecainide/Multaq is not an option.   So dicussed amiodarone with pt as it appears to be her only option. I discussed this could be short term bridge  to ablation in the near future as she appears to be a good candidate. But she runs fast in afib so feel she will be better served to get her back in rhythm short term with amiodarone. She is in agreement.   F/u in afib clinic, 04/30/20. She has now been loading on amiodarone x one month so will proceed with cardioversion to see if SR can be restored. Pt is in agreement. No missed doses of anticoagulation.   F/u in afib clinic, 05/18/20. She did have successful cardioversion 05/11/20 and ekg today shows SR.   F/u in afib clinic, 07/22/20. She has been staying in SR with amiodarone but reports that her PCP  is concerned as her TSH is staying elevated. We discussed reducing amiodarone  to 100 mg daily and referring for an ablation to see if amiodarone can be d/c after that.   She is now in the afib clinic, 10/14/20 for f/u ablation over concern for her thyroid status and wanting to stop amiodarone. She is in Sinus brady  today. She reports no swallowing or groin issues. Her BP was elevated in the hospital post procedure and is elevated today on arrival and f/u.   Follow up in the AF clinic 09/01/22. Patient reports that she has been in afib since 5/20 with elevated rates. She was started on PRN diltiazem and increased her daily metoprolol which had  converted her in the past. She remains in rapid afib today. There were no specific triggers that she could identify.   Follow up in the AF clinic 09/19/22. Patient is s/p DCCV on 09/06/22. Patient reports that her energy is slowly returning post DCCV. She remains in SR today. No bleeding issues on anticoagulation.   Today, she denies symptoms of palpitations, chest pain, shortness of breath, orthopnea, PND, lower extremity edema, dizziness, presyncope, syncope, or neurologic sequela. The patient is tolerating medications without difficulties and is otherwise without complaint today.   Past Medical History:  Diagnosis Date   Allergy    seasonal   Anemia    Arthritis    Cataract    bilateral removed   Celiac disease    Eczema    H/O transfusion of platelets    Hyperlipidemia    Hypothyroidism    OSA (obstructive sleep apnea)    Osteopenia    Paroxysmal atrial fibrillation (HCC)    Shingles    Vitamin D deficiency    Past Surgical History:  Procedure Laterality Date   ATRIAL FIBRILLATION ABLATION N/A 09/16/2020   Procedure: ATRIAL FIBRILLATION ABLATION;  Surgeon: Regan Lemming, MD;  Location: [facility] INVASIVE CV LAB;  Service: Cardiovascular;  Laterality: N/A;   CARDIOVERSION N/A 12/14/2017   Procedure: CARDIOVERSION;  Surgeon: Thurmon Fair, MD;  Location: [facility] ENDOSCOPY;  Service: Cardiovascular;  Laterality: N/A;   CARDIOVERSION N/A 11/07/2019   Procedure: CARDIOVERSION;  Surgeon: Jake Bathe, MD;  Location: [facility] ENDOSCOPY;  Service: Cardiovascular;  Laterality: N/A;   CARDIOVERSION N/A 03/24/2020   Procedure: CARDIOVERSION;  Surgeon: Meriam Sprague, MD;  Location: [facility] ENDOSCOPY;  Service: Cardiovascular;  Laterality: N/A;   CARDIOVERSION N/A 05/11/2020   Procedure: CARDIOVERSION;  Surgeon: Pricilla Riffle, MD;  Location: [facility] ENDOSCOPY;  Service: Cardiovascular;  Laterality: N/A;   CARDIOVERSION N/A 09/06/2022   Procedure: CARDIOVERSION;  Surgeon: [name], MD;   Location: [facility] INVASIVE CV LAB;  Service: Cardiovascular;  Laterality: N/A;   CATARACT EXTRACTION     bilateral   COLONOSCOPY     DILATION AND CURETTAGE OF UTERUS     UPPER GI ENDOSCOPY      Current Outpatient Medications  Medication Sig Dispense Refill   acetaminophen (TYLENOL) 650 MG CR tablet Take 1,300 mg by mouth at bedtime as needed for pain.     alendronate (FOSAMAX) 70 MG tablet Take 70 mg by mouth every 14 (fourteen) days.     amLODipine (NORVASC) 10 MG tablet TAKE 1/2 TABLET BY MOUTH DAILY 45 tablet 1   apixaban (ELIQUIS) 5 MG TABS tablet Take 1 tablet (5 mg total) by mouth 2 (two) times daily. 180 tablet 1   Ascorbic Acid (VITAMIN C) 500 MG CAPS Take 500 mg by mouth daily.  brimonidine (ALPHAGAN) 0.15 % ophthalmic solution Place 1 drop into the left eye in the morning and at bedtime.     Calcium Carb-Cholecalciferol (CALCIUM 600 + D PO) Take 1 tablet by mouth daily.     Cholecalciferol (VITAMIN D3) 10 MCG (400 UNIT) CAPS Take 400 Units by mouth daily with breakfast.     diltiazem (CARDIZEM) 30 MG tablet Take 1 tablet every 4 hours AS NEEDED for AFIB heart rate >100 as long as top BP >100. 30 tablet 1   dorzolamide-timolol (COSOPT) 22.3-6.8 MG/ML ophthalmic solution Place 1 drop into the left eye in the morning and at bedtime.     fluorometholone (FML) 0.1 % ophthalmic suspension Place 1 drop into the left eye in the morning.     levothyroxine (SYNTHROID) 88 MCG tablet Take 88 mcg by mouth daily before breakfast.     loratadine (CLARITIN) 10 MG tablet Take 10 mg by mouth at bedtime.     losartan (COZAAR) 50 MG tablet TAKE 1 TABLET BY MOUTH EVERY DAY 90 tablet 3   meclizine (ANTIVERT) 25 MG tablet Take 1 tablet (25 mg total) by mouth 3 (three) times daily as needed for dizziness. 30 tablet 0   Menthol-Methyl Salicylate (SALONPAS PAIN RELIEF PATCH EX) Apply 0.5 patches topically daily as needed (for pain).     metoprolol succinate (TOPROL-XL) 50 MG 24 hr tablet Take 0.5 tablets (25  mg total) by mouth daily. May take extra 1/2 tablet daily for breakthrough afib 90 tablet 3   Multiple Vitamins-Minerals (ONE-A-DAY WOMENS 50+ ADVANTAGE) TABS Take 1 tablet by mouth daily with breakfast.     PATADAY 0.2 % SOLN Place 1 drop into both eyes daily as needed (allergies).      PRILOSEC OTC 20 MG tablet Take 20 mg by mouth at bedtime.     rosuvastatin (CRESTOR) 10 MG tablet Take 10 mg by mouth at bedtime.  3   SYSTANE ULTRA 0.4-0.3 % SOLN Place 1 drop into both eyes daily as needed (dry eyes).      traMADol (ULTRAM) 50 MG tablet Take 25 mg by mouth daily as needed for moderate pain.     urea (URE-NA) 15 g PACK oral packet Take 15 g by mouth 2 (two) times daily. (Patient taking differently: Take 15 g by mouth 2 (two) times a week.) 60 packet 0   venlafaxine (EFFEXOR) 37.5 MG tablet Take 0.5 tablets by mouth daily.     No current facility-administered medications for this encounter.    Allergies  Allergen Reactions   Atorvastatin Other (See Comments)    Knee pain   Estelle June [Iron Polysaccharide] Other (See Comments)    Severe constipation    Gluten Meal Other (See Comments)    Constipation and upset stomach   Povidone Iodine Itching and Other (See Comments)    With prolonged usage, itching    Social History   Socioeconomic History   Marital status: Widowed    Spouse name: Not on file   Number of children: 4   Years of education: Not on file   Highest education level: Not on file  Occupational History   Occupation: retired    Associate Professor: RETIRED  Tobacco Use   Smoking status: Never   Smokeless tobacco: Never  Vaping Use   Vaping Use: Never used  Substance and Sexual Activity   Alcohol use: No    Alcohol/week: 0.0 standard drinks of alcohol   Drug use: No   Sexual activity: Not on file  Other  Topics Concern   Not on file  Social History Narrative   Daily caffeine   Social Determinants of Health   Financial Resource Strain: Not on file  Food Insecurity: Not on  file  Transportation Needs: Not on file  Physical Activity: Not on file  Stress: Not on file  Social Connections: Not on file  Intimate Partner Violence: Not on file    Family History  Problem Relation Age of Onset   Liver cancer Mother    Diabetes Father    Heart disease Father    Colon cancer Neg Hx     ROS- All systems are reviewed and negative except as per the HPI above  Physical Exam: Vitals:   09/19/22 0943  BP: (!) 168/72  Pulse: 62  Weight: 69.2 kg  Height: 5\' 4"  (1.626 m)    Wt Readings from Last 3 Encounters:  09/19/22 69.2 kg  09/06/22 68.9 kg  09/01/22 68.7 kg    Labs: Lab Results  Component Value Date   NA 133 (L) 09/01/2022   K 3.9 09/01/2022   CL 97 (L) 09/01/2022   CO2 25 09/01/2022   GLUCOSE 102 (H) 09/01/2022   BUN 20 09/01/2022   CREATININE 0.67 09/01/2022   CALCIUM 8.9 09/01/2022   PHOS 3.6 05/10/2021   MG 2.1 05/08/2021   No results found for: "INR" Lab Results  Component Value Date   CHOL 216 (H) 05/07/2021   HDL 89 05/07/2021   LDLCALC 115 (H) 05/07/2021   TRIG 61 05/07/2021     GEN- The patient is a well appearing elderly female, alert and oriented x 3 today.   HEENT-head normocephalic, atraumatic, sclera clear, conjunctiva pink, hearing intact, trachea midline. Lungs- Clear to ausculation bilaterally, normal work of breathing Heart- Regular rate and rhythm, no murmurs, rubs or gallops  GI- soft, NT, ND, + BS Extremities- no clubbing, cyanosis, or edema MS- no significant deformity or atrophy Skin- no rash or lesion Psych- euthymic mood, full affect Neuro- strength and sensation are intact   EKG today demonstrates SR, IVCD, 1st degree AV block Vent. rate 62 BPM PR interval 206 ms QRS duration 136 ms QT/QTcB 414/420 ms  Echo- 10/24/18  1. The left ventricle has normal systolic function with an ejection fraction of 60-65%. The cavity size was normal. There is mild concentric  left ventricular hypertrophy. Left  ventricular diastolic Doppler  parameters are consistent with pseudonormalization. Elevated mean left atrial pressure.   2. The right ventricle has normal systolic function. The cavity was  normal. There is no increase in right ventricular wall thickness. Right  ventricular systolic pressure is normal with an estimated pressure of 36.9  mmHg.   3. Left atrial size was mildly dilated.   4. There is mild to moderate mitral annular calcification present.   5. The aorta is normal in size and structure.     CHA2DS2-VASc Score = 5  The patient's score is based upon: CHF History: 0 HTN History: 1 Diabetes History: 0 Stroke History: 0 Vascular Disease History: 1 Age Score: 2 Gender Score: 1       ASSESSMENT AND PLAN: 1. Persistent Atrial Fibrillation (ICD10:  I48.19) The patient's CHA2DS2-VASc score is 5, indicating a 7.2% annual risk of stroke.   S/p ablation 09/16/20 Previously failed dofetilide. Amiodarone discontinued due to thyroid dysfunction. S/p DCCV 09/06/22 Patient appears to be maintaining SR.  Continue metoprolol succinate 25 mg daily  Continue Eliquis 5 mg BID Continue diltiazem 30 mg PRN q 4  hours for heart racing.   2. Secondary Hypercoagulable State (ICD10:  D68.69) The patient is at significant risk for stroke/thromboembolism based upon her CHA2DS2-VASc Score of 5.  Continue Apixaban (Eliquis).   3. HTN Elevated again today, she will keep BP log at home. If her readings are persistently elevated, could consider increasing amlodipine or losartan.   4. OSA Encouraged compliance with CPAP therapy.  5. CAD CAC score 424 on CT On statin No anginal symptoms.   Follow up in the AF clinic in 6 months.    Jorja Loa PA-C Afib Clinic [facility] [address] [place], [place] [address] [phone]

## 2022-10-31 ENCOUNTER — Telehealth (HOSPITAL_COMMUNITY): Payer: Self-pay

## 2022-10-31 NOTE — Telephone Encounter (Signed)
Patient called in regarding her A-fib. She has been in A-fib since 7/22. She is feeling okay. HR fluctuating between 85-119.  Today B/P 123/73 and HR 102. Advised patient to take Diltiazem 30 mg every 4 hours as long as B/P top # is above 100, and HR needs to be above 100. She has increased her Metoprolol to 25 mg BID.  She is scheduled for appointment on 7/31 @ 2:30pm to see Clint Fenton-PA. Consulted with patient and she verbalized understanding.

## 2022-11-02 ENCOUNTER — Other Ambulatory Visit (HOSPITAL_COMMUNITY): Payer: Self-pay | Admitting: Physician Assistant

## 2022-11-02 ENCOUNTER — Ambulatory Visit (HOSPITAL_COMMUNITY)
Admission: RE | Admit: 2022-11-02 | Discharge: 2022-11-02 | Disposition: A | Discharge status: Home / Self Care | Payer: Medicare Other | Source: Ambulatory Visit | Attending: Physician Assistant | Admitting: Physician Assistant

## 2022-11-02 ENCOUNTER — Encounter (HOSPITAL_COMMUNITY): Payer: Self-pay | Admitting: Physician Assistant

## 2022-11-02 VITALS — BP 126/72 | HR 105 | Ht 64.0 in | Wt 152.8 lb

## 2022-11-02 DIAGNOSIS — E785 Hyperlipidemia, unspecified: Secondary | ICD-10-CM | POA: Diagnosis not present

## 2022-11-02 DIAGNOSIS — Z7901 Long term (current) use of anticoagulants: Secondary | ICD-10-CM | POA: Insufficient documentation

## 2022-11-02 DIAGNOSIS — I2510 Atherosclerotic heart disease of native coronary artery without angina pectoris: Secondary | ICD-10-CM | POA: Insufficient documentation

## 2022-11-02 DIAGNOSIS — D6869 Other thrombophilia: Secondary | ICD-10-CM

## 2022-11-02 DIAGNOSIS — G4733 Obstructive sleep apnea (adult) (pediatric): Secondary | ICD-10-CM | POA: Diagnosis not present

## 2022-11-02 DIAGNOSIS — E039 Hypothyroidism, unspecified: Secondary | ICD-10-CM | POA: Diagnosis not present

## 2022-11-02 DIAGNOSIS — Z79899 Other long term (current) drug therapy: Secondary | ICD-10-CM | POA: Diagnosis not present

## 2022-11-02 DIAGNOSIS — I4819 Other persistent atrial fibrillation: Secondary | ICD-10-CM

## 2022-11-02 DIAGNOSIS — I10 Essential (primary) hypertension: Secondary | ICD-10-CM | POA: Diagnosis not present

## 2022-11-02 LAB — CBC
HCT: 38.2 % (ref 36.0–46.0)
Hemoglobin: 12.3 g/dL (ref 12.0–15.0)
MCH: 30.3 pg (ref 26.0–34.0)
MCHC: 32.2 g/dL (ref 30.0–36.0)
MCV: 94.1 fL (ref 80.0–100.0)
Platelets: 347 10*3/uL (ref 150–400)
RBC: 4.06 MIL/uL (ref 3.87–5.11)
RDW: 13.2 % (ref 11.5–15.5)
WBC: 7.9 10*3/uL (ref 4.0–10.5)
nRBC: 0 % (ref 0.0–0.2)

## 2022-11-02 LAB — BASIC METABOLIC PANEL
Anion gap: 13 (ref 5–15)
BUN: 18 mg/dL (ref 8–23)
CO2: 25 mmol/L (ref 22–32)
Calcium: 9.4 mg/dL (ref 8.9–10.3)
Chloride: 94 mmol/L — ABNORMAL LOW (ref 98–111)
Creatinine, Ser: 0.81 mg/dL (ref 0.44–1.00)
GFR, Estimated: >60 mL/min (ref >60)
Glucose, Bld: 106 mg/dL — ABNORMAL HIGH (ref 70–99)
Potassium: 4.4 mmol/L (ref 3.5–5.1)
Sodium: 132 mmol/L — ABNORMAL LOW (ref 135–145)

## 2022-11-02 NOTE — Patient Instructions (Addendum)
Decrease Metoprolol medication to 1/2 tablet by mouth daily after Cardioversion  Cardioversion scheduled for: August 6th 2024   - Arrive at the [facility] and go to admitting at 8:00am   - Do not eat or drink anything after midnight the night prior to your procedure.   - Take all your morning medication (except diabetic medications) with a sip of water prior to arrival.  - You will not be able to drive home after your procedure.    - Do NOT miss any doses of your blood thinner - if you should miss a dose please notify our office immediately.   - If you feel as if you go back into normal rhythm prior to scheduled cardioversion, please notify our office immediately.   If your procedure is canceled in the cardioversion suite you will be charged a cancellation fee.

## 2022-11-02 NOTE — H&P (View-Only) (Signed)
Primary Care Physician: Creola Corn, MD Referring Physician: [facility] ER f/u Cardiologist-  Dr.  Antoine Poche  EP- Dr. Mariel Kansky is a 82 y.o. female with a h/o HLD, hypothyroidism  that was successfully cardioverted in the [facility] ER 10/26/17 for new onset Afib. She returned to  the afib office 8/2 but had gone back into  afib and was unaware. She was started on BB for better rate control.   She denied tobacco, alcohol, excessive caffeine. Had been told she snored and appeared to stop breathing at night. Sleep study done 8/30 showed OSA. CPAP started. She had an echo that showed EF at 35% with diffuse hypokinesis. Lexi myoview did not show any ischemia but was an intermediate test.  She was already on an ARB/BB.   She remained in afib, rate controlled. She denied any exertional chest pain, is fatigued and has noted shortness of breath more so with steps. She was admitted for Tikosyn.  However after discharge qt prolonged and Tikosyn was stopped.   Since then she has had a long period of time without any afib until this past Monday. No  specific trigger but afib has been persistent since then. She feels some nausea and fatigue while in afib. Afib in the 80 's today on EKG. Echo in 2020 showed normalization of EF.   F/u afib clinic 11/14/19, following successful cardioversion. ekg today shows  Sinus brady . She feels well, improved in SR. She has had her covid shots a while back.   F/u in afib clinic, 01/02/20. She called to the office last week c/o left leg swelling since August. Rt leg no swelling. Reduced her amlodipine for a few days with mild improvement but still swelling. I scheduled her for an Venous U/S this am which was negative for DVT. Minimal swelling this am. Usually  gets more swelling  during the day and down by the next am. She does wear intermittent support socks. Tries to minimize salt. No pain  associated with the swelling. 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 The U/S   technician mentioned to her that she could see evidence of  incompetent veins.   BP elevated on presentation this am, usually well controlled. On recheck improved to 150/80. She will check again when she gets home.  F/u in afib clinic, 12/15,  for return of afib. Would like  to be cardioverted. No missed anticoagulation. She is in afib in the 90's.  F/u afib clinic, 03/31/20, failed cardioversion, despite multiple shocks. We discussed antiarrythmic's. She has failed tikoyn in the past. She has h/o HF and IVCD so flecainide/Multaq is not an option.   So dicussed amiodarone with pt as it appears to be her only option. I discussed this could be short term bridge  to ablation in the near future as she appears to be a good candidate. But she runs fast in afib so feel she will be better served to get her back in rhythm short term with amiodarone. She is in agreement.   F/u in afib clinic, 04/30/20. She has now been loading on amiodarone x one month so will proceed with cardioversion to see if SR can be restored. Pt is in agreement. No missed doses of anticoagulation.   F/u in afib clinic, 05/18/20. She did have successful cardioversion 05/11/20 and ekg today shows SR.   F/u in afib clinic, 07/22/20. She has been staying in SR with amiodarone but reports that her PCP  is concerned as her TSH is staying elevated. We discussed reducing amiodarone  to 100 mg daily and referring for an ablation to see if amiodarone can be d/c after that.   She is now in the afib clinic, 10/14/20 for f/u ablation over concern for her thyroid status and wanting to stop amiodarone. She is in Sinus brady  today. She reports no swallowing or groin issues. Her BP was elevated in the hospital post procedure and is elevated today on arrival and f/u.   Follow up in the AF clinic 09/01/22. Patient reports that she has been in afib since 5/20 with elevated rates. She was started on PRN diltiazem and increased her daily metoprolol which had  converted her in the past. She remains in rapid afib today. There were no specific triggers that she could identify.   Follow up in the AF clinic 09/19/22. Patient is s/p DCCV on 09/06/22. Patient reports that her energy is slowly returning post DCCV. She remains in SR today. No bleeding issues on anticoagulation.   Follow up in the AF clinic 11/02/22. Patient reports that she went back into afib on 7/22. She increased her BB to BID and has been using PRN diltiazem. There were no specific triggers that she could identify.   Today, she denies symptoms of chest pain, shortness of breath, orthopnea, PND, lower extremity edema, dizziness, presyncope, syncope, or neurologic sequela. The patient is tolerating medications without difficulties and is otherwise without complaint today.   Past Medical History:  Diagnosis Date   Allergy    seasonal   Anemia    Arthritis    Cataract    bilateral removed   Celiac disease    Eczema    H/O transfusion of platelets    Hyperlipidemia    Hypothyroidism    OSA (obstructive sleep apnea)    Osteopenia    Paroxysmal atrial fibrillation (HCC)    Shingles    Vitamin D deficiency     ROS- All systems are reviewed and negative except as per the HPI above  Physical Exam: Vitals:   11/02/22 1424  BP: 126/72  Pulse: (!) 105  Weight: 69.3 kg  Height: 5\' 4"  (1.626 m)    Wt Readings from Last 3 Encounters:  11/02/22 69.3 kg  09/19/22 69.2 kg  09/06/22 68.9 kg    GEN: Well nourished, well developed in no acute distress NECK: No JVD; No carotid bruits CARDIAC: Irregularly irregular rate and rhythm, no murmurs, rubs, gallops RESPIRATORY:  Clear to auscultation without rales, wheezing or rhonchi  ABDOMEN: Soft, non-tender, non-distended EXTREMITIES:  No edema; No deformity    EKG today demonstrates Afib, LBBB Vent. rate 105 BPM PR interval * ms QRS duration 136 ms QT/QTcB 314/415 ms  Echo- 10/24/18  1. The left ventricle has normal systolic  function with an ejection fraction of 60-65%. The cavity size was normal. There is mild concentric  left ventricular hypertrophy. Left ventricular diastolic Doppler  parameters are consistent with pseudonormalization. Elevated mean left atrial pressure.   2. The right ventricle has normal systolic function. The cavity was  normal. There is no increase in right ventricular wall thickness. Right  ventricular systolic pressure is normal with an estimated pressure of 36.9  mmHg.   3. Left atrial size was mildly dilated.   4. There is mild to moderate mitral annular calcification present.   5. The aorta is normal in size and structure.     CHA2DS2-VASc Score = 5  The patient's score  is based upon: CHF History: 0 HTN History: 1 Diabetes History: 0 Stroke History: 0 Vascular Disease History: 1 Age Score: 2 Gender Score: 1       ASSESSMENT AND PLAN: Persistent Atrial Fibrillation (ICD10:  I48.19) The patient's CHA2DS2-VASc score is 5, indicating a 7.2% annual risk of stroke.   S/p ablation 09/16/20 Previously failed dofetilide due to QT prolongation. Amiodarone discontinued due to thyroid dysfunction. Would avoid class IC with h/o CAD. Short term, will arrange for repeat DCCV. Check bmet/cbc today. Long term, she is agreeable to discussing repeat ablation with Dr Elberta Fortis.  Continue metoprolol succinate 12.5 mg BID until DCCV, then decrease to 12.5 mg daily. Continue Eliquis 5 mg BID Continue diltiazem 30 mg PRN q 4 hours for heart racing.   Secondary Hypercoagulable State (ICD10:  D68.69) The patient is at significant risk for stroke/thromboembolism based upon her CHA2DS2-VASc Score of 5.  Continue Apixaban (Eliquis).   HTN Stable on current regimen  OSA  Encouraged nightly CPAP  CAD CAC score 424 on CT On statin No anginal symptoms   Follow up with Dr Elberta Fortis post DCCV.    Informed Consent   Shared Decision Making/Informed Consent The risks (stroke, cardiac arrhythmias  rarely resulting in the need for a temporary or permanent pacemaker, skin irritation or burns and complications associated with conscious sedation including aspiration, arrhythmia, respiratory failure and death), benefits (restoration of normal sinus rhythm) and alternatives of a direct current cardioversion were explained in detail to Ms. Chicas and she agrees to proceed.        Jorja Loa PA-C Afib Clinic [facility] [address] [place], [place] [address] [phone]

## 2022-11-02 NOTE — Progress Notes (Signed)
Primary Care Physician: Creola Corn, MD Referring Physician: [facility] ER f/u Cardiologist-  Dr.  Antoine Poche  EP- Dr. Mariel Kansky is a 82 y.o. female with a h/o HLD, hypothyroidism  that was successfully cardioverted in the [facility] ER 10/26/17 for new onset Afib. She returned to  the afib office 8/2 but had gone back into  afib and was unaware. She was started on BB for better rate control.   She denied tobacco, alcohol, excessive caffeine. Had been told she snored and appeared to stop breathing at night. Sleep study done 8/30 showed OSA. CPAP started. She had an echo that showed EF at 35% with diffuse hypokinesis. Lexi myoview did not show any ischemia but was an intermediate test.  She was already on an ARB/BB.   She remained in afib, rate controlled. She denied any exertional chest pain, is fatigued and has noted shortness of breath more so with steps. She was admitted for Tikosyn.  However after discharge qt prolonged and Tikosyn was stopped.   Since then she has had a long period of time without any afib until this past Monday. No  specific trigger but afib has been persistent since then. She feels some nausea and fatigue while in afib. Afib in the 80 's today on EKG. Echo in 2020 showed normalization of EF.   F/u afib clinic 11/14/19, following successful cardioversion. ekg today shows  Sinus brady . She feels well, improved in SR. She has had her covid shots a while back.   F/u in afib clinic, 01/02/20. She called to the office last week c/o left leg swelling since August. Rt leg no swelling. Reduced her amlodipine for a few days with mild improvement but still swelling. I scheduled her for an Venous U/S this am which was negative for DVT. Minimal swelling this am. Usually  gets more swelling  during the day and down by the next am. She does wear intermittent support socks. Tries to minimize salt. No pain  associated with the swelling. 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 The U/S   technician mentioned to her that she could see evidence of  incompetent veins.   BP elevated on presentation this am, usually well controlled. On recheck improved to 150/80. She will check again when she gets home.  F/u in afib clinic, 12/15,  for return of afib. Would like  to be cardioverted. No missed anticoagulation. She is in afib in the 90's.  F/u afib clinic, 03/31/20, failed cardioversion, despite multiple shocks. We discussed antiarrythmic's. She has failed tikoyn in the past. She has h/o HF and IVCD so flecainide/Multaq is not an option.   So dicussed amiodarone with pt as it appears to be her only option. I discussed this could be short term bridge  to ablation in the near future as she appears to be a good candidate. But she runs fast in afib so feel she will be better served to get her back in rhythm short term with amiodarone. She is in agreement.   F/u in afib clinic, 04/30/20. She has now been loading on amiodarone x one month so will proceed with cardioversion to see if SR can be restored. Pt is in agreement. No missed doses of anticoagulation.   F/u in afib clinic, 05/18/20. She did have successful cardioversion 05/11/20 and ekg today shows SR.   F/u in afib clinic, 07/22/20. She has been staying in SR with amiodarone but reports that her PCP  is concerned as her TSH is staying elevated. We discussed reducing amiodarone  to 100 mg daily and referring for an ablation to see if amiodarone can be d/c after that.   She is now in the afib clinic, 10/14/20 for f/u ablation over concern for her thyroid status and wanting to stop amiodarone. She is in Sinus brady  today. She reports no swallowing or groin issues. Her BP was elevated in the hospital post procedure and is elevated today on arrival and f/u.   Follow up in the AF clinic 09/01/22. Patient reports that she has been in afib since 5/20 with elevated rates. She was started on PRN diltiazem and increased her daily metoprolol which had  converted her in the past. She remains in rapid afib today. There were no specific triggers that she could identify.   Follow up in the AF clinic 09/19/22. Patient is s/p DCCV on 09/06/22. Patient reports that her energy is slowly returning post DCCV. She remains in SR today. No bleeding issues on anticoagulation.   Follow up in the AF clinic 11/02/22. Patient reports that she went back into afib on 7/22. She increased her BB to BID and has been using PRN diltiazem. There were no specific triggers that she could identify.   Today, she denies symptoms of chest pain, shortness of breath, orthopnea, PND, lower extremity edema, dizziness, presyncope, syncope, or neurologic sequela. The patient is tolerating medications without difficulties and is otherwise without complaint today.   Past Medical History:  Diagnosis Date   Allergy    seasonal   Anemia    Arthritis    Cataract    bilateral removed   Celiac disease    Eczema    H/O transfusion of platelets    Hyperlipidemia    Hypothyroidism    OSA (obstructive sleep apnea)    Osteopenia    Paroxysmal atrial fibrillation (HCC)    Shingles    Vitamin D deficiency     ROS- All systems are reviewed and negative except as per the HPI above  Physical Exam: Vitals:   11/02/22 1424  BP: 126/72  Pulse: (!) 105  Weight: 69.3 kg  Height: 5\' 4"  (1.626 m)    Wt Readings from Last 3 Encounters:  11/02/22 69.3 kg  09/19/22 69.2 kg  09/06/22 68.9 kg    GEN: Well nourished, well developed in no acute distress NECK: No JVD; No carotid bruits CARDIAC: Irregularly irregular rate and rhythm, no murmurs, rubs, gallops RESPIRATORY:  Clear to auscultation without rales, wheezing or rhonchi  ABDOMEN: Soft, non-tender, non-distended EXTREMITIES:  No edema; No deformity    EKG today demonstrates Afib, LBBB Vent. rate 105 BPM PR interval * ms QRS duration 136 ms QT/QTcB 314/415 ms  Echo- 10/24/18  1. The left ventricle has normal systolic  function with an ejection fraction of 60-65%. The cavity size was normal. There is mild concentric  left ventricular hypertrophy. Left ventricular diastolic Doppler  parameters are consistent with pseudonormalization. Elevated mean left atrial pressure.   2. The right ventricle has normal systolic function. The cavity was  normal. There is no increase in right ventricular wall thickness. Right  ventricular systolic pressure is normal with an estimated pressure of 36.9  mmHg.   3. Left atrial size was mildly dilated.   4. There is mild to moderate mitral annular calcification present.   5. The aorta is normal in size and structure.     CHA2DS2-VASc Score = 5  The patient's score  is based upon: CHF History: 0 HTN History: 1 Diabetes History: 0 Stroke History: 0 Vascular Disease History: 1 Age Score: 2 Gender Score: 1       ASSESSMENT AND PLAN: Persistent Atrial Fibrillation (ICD10:  I48.19) The patient's CHA2DS2-VASc score is 5, indicating a 7.2% annual risk of stroke.   S/p ablation 09/16/20 Previously failed dofetilide due to QT prolongation. Amiodarone discontinued due to thyroid dysfunction. Would avoid class IC with h/o CAD. Short term, will arrange for repeat DCCV. Check bmet/cbc today. Long term, she is agreeable to discussing repeat ablation with Dr Elberta Fortis.  Continue metoprolol succinate 12.5 mg BID until DCCV, then decrease to 12.5 mg daily. Continue Eliquis 5 mg BID Continue diltiazem 30 mg PRN q 4 hours for heart racing.   Secondary Hypercoagulable State (ICD10:  D68.69) The patient is at significant risk for stroke/thromboembolism based upon her CHA2DS2-VASc Score of 5.  Continue Apixaban (Eliquis).   HTN Stable on current regimen  OSA  Encouraged nightly CPAP  CAD CAC score 424 on CT On statin No anginal symptoms   Follow up with Dr Elberta Fortis post DCCV.    Informed Consent   Shared Decision Making/Informed Consent The risks (stroke, cardiac arrhythmias  rarely resulting in the need for a temporary or permanent pacemaker, skin irritation or burns and complications associated with conscious sedation including aspiration, arrhythmia, respiratory failure and death), benefits (restoration of normal sinus rhythm) and alternatives of a direct current cardioversion were explained in detail to [name] and she agrees to proceed.        Jorja Loa PA-C [facility] [address] [place], [place] [address] [phone]

## 2022-11-07 NOTE — Pre-Procedure Instructions (Signed)
Spoke to patient on phone regarding procedure tomorrow.  Instructed to arrive at 6:45 am, NPO after midnight.  Confirmed patient has a ride home and responsible person to stay with patient for 24 hours after the procedure  Confirmed no missed doses of Eliquis , instructed patient to take the morning of surgery with a sip of water.  Take BP meds in the AM with a sip of water

## 2022-11-08 ENCOUNTER — Encounter (HOSPITAL_COMMUNITY)
Admission: RE | Disposition: A | Discharge status: Home / Self Care | Payer: Self-pay | Source: Home / Self Care | Attending: Cardiology

## 2022-11-08 ENCOUNTER — Other Ambulatory Visit: Payer: Self-pay

## 2022-11-08 ENCOUNTER — Ambulatory Visit (HOSPITAL_BASED_OUTPATIENT_CLINIC_OR_DEPARTMENT_OTHER): Payer: Medicare Other | Admitting: Anesthesiology

## 2022-11-08 ENCOUNTER — Ambulatory Visit (HOSPITAL_COMMUNITY): Payer: Medicare Other | Admitting: Anesthesiology

## 2022-11-08 ENCOUNTER — Encounter (HOSPITAL_COMMUNITY): Payer: Self-pay | Admitting: Cardiology

## 2022-11-08 ENCOUNTER — Ambulatory Visit (HOSPITAL_COMMUNITY)
Admission: RE | Admit: 2022-11-08 | Discharge: 2022-11-08 | Disposition: A | Discharge status: Home / Self Care | Payer: Medicare Other | Attending: Cardiology | Admitting: Cardiology

## 2022-11-08 DIAGNOSIS — I4891 Unspecified atrial fibrillation: Secondary | ICD-10-CM | POA: Diagnosis not present

## 2022-11-08 DIAGNOSIS — E039 Hypothyroidism, unspecified: Secondary | ICD-10-CM | POA: Insufficient documentation

## 2022-11-08 DIAGNOSIS — Z7901 Long term (current) use of anticoagulants: Secondary | ICD-10-CM | POA: Diagnosis not present

## 2022-11-08 DIAGNOSIS — D6869 Other thrombophilia: Secondary | ICD-10-CM | POA: Diagnosis not present

## 2022-11-08 DIAGNOSIS — Z79899 Other long term (current) drug therapy: Secondary | ICD-10-CM | POA: Insufficient documentation

## 2022-11-08 DIAGNOSIS — I10 Essential (primary) hypertension: Secondary | ICD-10-CM | POA: Diagnosis not present

## 2022-11-08 DIAGNOSIS — G4733 Obstructive sleep apnea (adult) (pediatric): Secondary | ICD-10-CM | POA: Diagnosis not present

## 2022-11-08 DIAGNOSIS — E785 Hyperlipidemia, unspecified: Secondary | ICD-10-CM | POA: Insufficient documentation

## 2022-11-08 DIAGNOSIS — I4819 Other persistent atrial fibrillation: Secondary | ICD-10-CM | POA: Diagnosis not present

## 2022-11-08 DIAGNOSIS — I2510 Atherosclerotic heart disease of native coronary artery without angina pectoris: Secondary | ICD-10-CM | POA: Diagnosis not present

## 2022-11-08 HISTORY — PX: CARDIOVERSION: SHX1299

## 2022-11-08 SURGERY — CARDIOVERSION
Anesthesia: General

## 2022-11-08 MED ORDER — SODIUM CHLORIDE 0.9 % IV SOLN: INTRAVENOUS | Status: DC

## 2022-11-08 MED ORDER — PROPOFOL 10 MG/ML IV BOLUS
INTRAVENOUS | Status: DC | PRN
Start: 2022-11-08 — End: 2022-11-08
  Administered 2022-11-08: 40 mg via INTRAVENOUS

## 2022-11-08 SURGICAL SUPPLY — 1 items: ELECT DEFIB PAD ADLT CADENCE (PAD) ×2 IMPLANT

## 2022-11-08 NOTE — Interval H&P Note (Signed)
History and Physical Interval Note:  11/08/2022 7:37 AM  Susan Bell  has presented today for surgery, with the diagnosis of AFIB.  The various methods of treatment have been discussed with the patient and family. After consideration of risks, benefits and other options for treatment, the patient has consented to  Procedure(s): CARDIOVERSION (N/A) as a surgical intervention.  The patient's history has been reviewed, patient examined, no change in status, stable for surgery.  I have reviewed the patient's chart and labs.  Questions were answered to the patient's satisfaction.     Armanda Magic

## 2022-11-08 NOTE — Transfer of Care (Signed)
Immediate Anesthesia Transfer of Care Note  Patient: Susan Bell  Procedure(s) Performed: CARDIOVERSION  Patient Location: Cath Lab  Anesthesia Type:General  Level of Consciousness: drowsy  Airway & Oxygen Therapy: Patient Spontanous Breathing and Patient connected to nasal cannula oxygen  Post-op Assessment: Report given to RN and Post -op Vital signs reviewed and stable  Post vital signs: Reviewed and stable  Last Vitals:  Vitals Value Taken Time  BP 136/56   Temp    Pulse 60   Resp 19   SpO2 98     Last Pain:  Vitals:   11/08/22 0700  TempSrc: Temporal  PainSc: 0-No pain         Complications: No notable events documented.

## 2022-11-08 NOTE — Anesthesia Procedure Notes (Signed)
Procedure Name: General with mask airway Date/Time: 11/08/2022 7:45 AM  Performed by: Randon Goldsmith, CRNAPre-anesthesia Checklist: Patient identified, Emergency Drugs available, Suction available and Patient being monitored Patient Re-evaluated:Patient Re-evaluated prior to induction Oxygen Delivery Method: Nasal cannula Preoxygenation: Pre-oxygenation with 100% oxygen

## 2022-11-08 NOTE — Discharge Instructions (Signed)
Electrical Cardioversion Electrical cardioversion is the delivery of a jolt of electricity to restore a normal rhythm to the heart. A rhythm that is too fast or is not regular (arrhythmia) keeps the heart from pumping blood well. There is also another type of cardioversion called a chemical (pharmacologic) cardioversion. This is when your health care provider gives you one or more medicines to bring back your regular heart rhythm. Electrical cardioversion is done as a scheduled procedure for arrhythmiasthat are not life-threatening. Electrical cardioversion may also be done in an emergency for sudden life-threatening arrhythmias. Tell a health care provider about: Any allergies you have. All medicines you are taking, including vitamins, herbs, eye drops, creams, and over-the-counter medicines. Any problems you or family members have had with sedatives or anesthesia. Any bleeding problems you have. Any surgeries you have had, including a pacemaker, defibrillator, or other implanted device. Any medical conditions you have. Whether you are pregnant or may be pregnant. What are the risks? Your provider will talk with you about risks. These include: Allergic reactions to medicines. Irritation to the skin on your chest or back where the sticky pads (electrodes) or paddles were put during electrical cardioversion. A blood clot that breaks free and travels to other parts of your body, such as your brain. Return of a worse abnormal heart rhythm that will need to be treated with medicines, a pacemaker, or an implantable cardioverter defibrillator (ICD). What happens before the procedure? Medicines Your provider may give you: Blood-thinning medicines (anticoagulants) so your blood does not clot as easily. If your provider gives you this medicine, you may need to take it for 4 weeks before the procedure. Medicines to help stabilize your heart rate and rhythm. Ask your provider about: Changing or stopping  your regular medicines. These include any diabetes medicines or blood thinners you take. Taking medicines such as aspirin and ibuprofen. These medicines can thin your blood. Do not take them unless your provider tells you to. Taking over-the-counter medicines, vitamins, herbs, and supplements. General instructions Follow instructions from your provider about what you may eat and drink. Do not put any lotions, powders, or ointments on your chest and back for 24 hours before the procedure. They can cause problems with the electrodes or paddles used to deliver electricity to your heart. Do not wear jewelry as this can interfere with delivering electricity to your heart. If you will be going home right after the procedure, plan to have a responsible adult: Take you home from the hospital or clinic. You will not be allowed to drive. Care for you for the time you are told. Tests You may have an exam or testing. This may include: Blood labs. A transesophageal echocardiogram (TEE). What happens during the procedure?     An IV will be inserted into one of your veins. You will be given a sedative. This helps you relax. Electrodes or metal paddles will be placed on your chest. They may be placed in one of these ways: One placed on your right chest, the other on the left ribs. One placed on your chest and the other on your back. An electrical shock will be delivered. The shock briefly stops (resets) your heart rhythm. Your provider will check to see if your heart rhythm is now normal. Some people need only one shock. Some need more to restore a normal heart rhythm. The procedure may vary among providers and hospitals. What happens after the procedure? Your blood pressure, heart rate, breathing rate, and blood oxygen  level will be monitored until you leave the hospital or clinic. Your heart rhythm will be watched to make sure it does not change. This information is not intended to replace advice  given to you by your health care provider. Make sure you discuss any questions you have with your health care provider. Document Revised: 11/11/2021 Document Reviewed: 11/11/2021 Elsevier Patient Education  2024 [employer].

## 2022-11-08 NOTE — Anesthesia Postprocedure Evaluation (Signed)
Anesthesia Post Note  Patient: Susan Bell  Procedure(s) Performed: CARDIOVERSION     Patient location during evaluation: Cath Lab Anesthesia Type: General Level of consciousness: awake and alert Pain management: pain level controlled Vital Signs Assessment: post-procedure vital signs reviewed and stable Respiratory status: spontaneous breathing, nonlabored ventilation and respiratory function stable Cardiovascular status: blood pressure returned to baseline and stable Postop Assessment: no apparent nausea or vomiting Anesthetic complications: no   No notable events documented.  Last Vitals:  Vitals:   11/08/22 0753 11/08/22 0800  BP: (!) 136/56 137/64  Pulse: (!) 59 61  Resp: 17 12  Temp: (!) 35.7 C   SpO2: 98% 100%    Last Pain:  Vitals:   11/08/22 0753  TempSrc: Temporal  PainSc: 0-No pain                  

## 2022-11-08 NOTE — Anesthesia Preprocedure Evaluation (Signed)
Anesthesia Evaluation  Patient identified by MRN, date of birth, ID band Patient awake    Reviewed: Allergy & Precautions, NPO status , Patient's Chart, lab work & pertinent test results  History of Anesthesia Complications Negative for: history of anesthetic complications  Airway Mallampati: II  TM Distance: >3 FB Neck ROM: Full    Dental  (+) Teeth Intact, Dental Advisory Given   Pulmonary sleep apnea    breath sounds clear to auscultation       Cardiovascular hypertension, Pt. on medications (-) angina (-) Past MI + dysrhythmias Atrial Fibrillation  Rhythm:Irregular     Neuro/Psych negative neurological ROS  negative psych ROS   GI/Hepatic Neg liver ROS,GERD  ,,  Endo/Other  Hypothyroidism    Renal/GU Lab Results      Component                Value               Date                      NA                       132 (L)             11/02/2022                K                        4.4                 11/02/2022                CO2                      25                  11/02/2022                GLUCOSE                  106 (H)             11/02/2022                BUN                      18                  11/02/2022                CREATININE               0.81                11/02/2022                CALCIUM                  9.4                 11/02/2022                EGFR                     77                  09/03/2020  GFRNONAA                 >60                 11/02/2022                Musculoskeletal  (+) Arthritis ,    Abdominal   Peds  Hematology  (+) Blood dyscrasia Lab Results      Component                Value               Date                      WBC                      7.9                 11/02/2022                HGB                      12.3                11/02/2022                HCT                      38.2                11/02/2022                MCV                       94.1                11/02/2022                PLT                      347                 11/02/2022             eliquis   Anesthesia Other Findings   Reproductive/Obstetrics                             Anesthesia Physical Anesthesia Plan  ASA: 2  Anesthesia Plan: General   Post-op Pain Management: Minimal or no pain anticipated   Induction: Intravenous  PONV Risk Score and Plan: 3  Airway Management Planned: Nasal Cannula and Natural Airway  Additional Equipment: None  Intra-op Plan:   Post-operative Plan:   Informed Consent: I have reviewed the patients History and Physical, chart, labs and discussed the procedure including the risks, benefits and alternatives for the proposed anesthesia with the patient or authorized representative who has indicated his/her understanding and acceptance.     Dental advisory given  Plan Discussed with: CRNA  Anesthesia Plan Comments:        Anesthesia Quick Evaluation

## 2022-11-08 NOTE — CV Procedure (Signed)
    Electrical Cardioversion Procedure Note ANJELLICA GREENLAND [id number] 02-Feb-1941  Procedure: Electrical Cardioversion Indications:  Atrial Fibrillation  Time Out: Verified patient identification, verified procedure,medications/allergies/relevent history reviewed, required imaging and test results available.  Performed  Procedure Details  The patient was NPO after midnight. Anesthesia was administered at the beside  by Dr.Moser with 40mg  of propofol.  Cardioversion was done with synchronized biphasic defibrillation with AP pads with 150watts.  The patient converted to normal sinus rhythm. The patient tolerated the procedure well   IMPRESSION:  Successful cardioversion of atrial fibrillation      11/08/2022, 7:37 AM

## 2022-11-09 ENCOUNTER — Encounter (HOSPITAL_COMMUNITY): Payer: Self-pay | Admitting: Cardiology

## 2022-11-22 DIAGNOSIS — Z1231 Encounter for screening mammogram for malignant neoplasm of breast: Secondary | ICD-10-CM | POA: Diagnosis not present

## 2022-12-02 ENCOUNTER — Ambulatory Visit: Payer: Medicare Other | Attending: Cardiology | Admitting: Cardiology

## 2022-12-02 ENCOUNTER — Encounter: Payer: Self-pay | Admitting: Cardiology

## 2022-12-02 VITALS — BP 152/80 | HR 67 | Ht 64.0 in | Wt 152.0 lb

## 2022-12-02 DIAGNOSIS — I4819 Other persistent atrial fibrillation: Secondary | ICD-10-CM | POA: Diagnosis not present

## 2022-12-02 DIAGNOSIS — I2510 Atherosclerotic heart disease of native coronary artery without angina pectoris: Secondary | ICD-10-CM | POA: Diagnosis not present

## 2022-12-02 DIAGNOSIS — D6869 Other thrombophilia: Secondary | ICD-10-CM | POA: Diagnosis not present

## 2022-12-02 DIAGNOSIS — I10 Essential (primary) hypertension: Secondary | ICD-10-CM

## 2022-12-02 NOTE — Patient Instructions (Signed)
Medication Instructions:  Your physician recommends that you continue on your current medications as directed. Please refer to the Current Medication list given to you today.  *If you need a refill on your cardiac medications before your next appointment, please call your pharmacy*   Lab Work: Pre procedure labs -- see procedure instruction letter:  BMP & CBC  If you have labs (blood work) drawn today and your tests are completely normal, you will receive your results only [id number] Message (if you have MyChart). Otherwise no news is good news. If you have any lab test that is abnormal and we need to change your treatment, we will call you to review the results.   Testing/Procedures: Your physician has requested that you have cardiac CT within 10 days PRIOR to your ablation. Cardiac computed tomography (CT) is a painless test that uses an x-ray machine to take clear, detailed pictures of your heart.  Please follow instruction below located under "other instructions". You will get a call from our office to schedule the date for this test.  Your physician has recommended that you have an ablation. Catheter ablation is a medical procedure used to treat some cardiac arrhythmias (irregular heartbeats). During catheter ablation, a long, thin, flexible tube is put into a blood vessel in your groin (upper thigh), or neck. This tube is called an ablation catheter. It is then guided to your heart through the blood vessel. Radio frequency waves destroy small areas of heart tissue where abnormal heartbeats may cause an arrhythmia to start.   You will be scheduled for ________.  The EP scheduler, April, will be in touch with procedure date & instructions.   Follow-Up: At [facility], you and your health needs are our priority.  As part of our continuing mission to provide you with exceptional heart care, we have created designated Provider Care Teams.  These Care Teams include your primary Cardiologist  (physician) and Advanced Practice Providers (APPs -  Physician Assistants and Nurse Practitioners) who all work together to provide you with the care you need, when you need it.  Your next appointment:   1 month(s) after your ablation  The format for your next appointment:   In Person  Provider:   AFib clinic   Thank you for choosing [facility]!!   Dory Horn, RN [phone]    Other Instructions   Cardiac Ablation Cardiac ablation is a procedure to destroy (ablate) some heart tissue that is sending bad signals. These bad signals cause problems in heart rhythm. The heart has many areas that make these signals. If there are problems in these areas, they can make the heart beat in a way that is not normal. Destroying some tissues can help make the heart rhythm normal. Tell your doctor about: Any allergies you have. All medicines you are taking. These include vitamins, herbs, eye drops, creams, and over-the-counter medicines. Any problems you or family members have had with medicines that make you fall asleep (anesthetics). Any blood disorders you have. Any surgeries you have had. Any medical conditions you have, such as kidney failure. Whether you are pregnant or may be pregnant. What are the risks? This is a safe procedure. But problems may occur, including: Infection. Bruising and bleeding. Bleeding into the chest. Stroke or blood clots. Damage to nearby areas of your body. Allergies to medicines or dyes. The need for a pacemaker if the normal system is damaged. Failure of the procedure to treat the problem. What happens before the procedure?  Medicines Ask your doctor about: Changing or stopping your normal medicines. This is important. Taking aspirin and ibuprofen. Do not take these medicines unless your doctor tells you to take them. Taking other medicines, vitamins, herbs, and supplements. General instructions Follow instructions from your doctor about  what you cannot eat or drink. Plan to have someone take you home from the hospital or clinic. If you will be going home right after the procedure, plan to have someone with you for 24 hours. Ask your doctor what steps will be taken to prevent infection. What happens during the procedure?  An IV tube will be put into one of your veins. You will be given a medicine to help you relax. The skin on your neck or groin will be numbed. A cut (incision) will be made in your neck or groin. A needle will be put through your cut and into a large vein. A tube (catheter) will be put into the needle. The tube will be moved to your heart. Dye may be put through the tube. This helps your doctor see your heart. Small devices (electrodes) on the tube will send out signals. A type of energy will be used to destroy some heart tissue. The tube will be taken out. Pressure will be held on your cut. This helps stop bleeding. A bandage will be put over your cut. The exact procedure may vary among doctors and hospitals. What happens after the procedure? You will be watched until you leave the hospital or clinic. This includes checking your heart rate, breathing rate, oxygen, and blood pressure. Your cut will be watched for bleeding. You will need to lie still for a few hours. Do not drive for 24 hours or as long as your doctor tells you. Summary Cardiac ablation is a procedure to destroy some heart tissue. This is done to treat heart rhythm problems. Tell your doctor about any medical conditions you may have. Tell him or her about all medicines you are taking to treat them. This is a safe procedure. But problems may occur. These include infection, bruising, bleeding, and damage to nearby areas of your body. Follow what your doctor tells you about food and drink. You may also be told to change or stop some of your medicines. After the procedure, do not drive for 24 hours or as long as your doctor tells you. This  information is not intended to replace advice given to you by your health care provider. Make sure you discuss any questions you have with your health care provider. Document Revised: 06/11/2021 Document Reviewed: 02/21/2019 Elsevier Patient Education  2023 [employer]   Cardiac Ablation, Care After  This sheet gives you information about how to care for yourself after your procedure. Your health care provider may also give you more specific instructions. If you have problems or questions, contact your health care provider. What can I expect after the procedure? After the procedure, it is common to have: Bruising around your puncture site. Tenderness around your puncture site. Skipped heartbeats. If you had an atrial fibrillation ablation, you may have atrial fibrillation during the first several months after your procedure.  Tiredness (fatigue).  Follow these instructions at home: Puncture site care  Follow instructions from your health care provider about how to take care of your puncture site. Make sure you: If present, leave stitches (sutures), skin glue, or adhesive strips in place. These skin closures may need to stay in place for up to 2 weeks. If adhesive strip  edges start to loosen and curl up, you may trim the loose edges. Do not remove adhesive strips completely unless your health care provider tells you to do that. If a large square bandage is present, this may be removed 24 hours after surgery.  Check your puncture site every day for signs of infection. Check for: Redness, swelling, or pain. Fluid or blood. If your puncture site starts to bleed, lie down on your back, apply firm pressure to the area, and contact your health care provider. Warmth. Pus or a bad smell. A pea or small marble sized lump at the site is normal and can take up to three months to resolve.  Driving Do not drive for at least 4 days after your procedure or however long your health care provider  recommends. (Do not resume driving if you have previously been instructed not to drive for other health reasons.) Do not drive or use heavy machinery while taking prescription pain medicine. Activity Avoid activities that take a lot of effort for at least 7 days after your procedure. Do not lift anything that is heavier than 5 lb (4.5 kg) for one week.  No sexual activity for 1 week.  Return to your normal activities as told by your health care provider. Ask your health care provider what activities are safe for you. General instructions Take over-the-counter and prescription medicines only as told by your health care provider. Do not use any products that contain nicotine or tobacco, such as cigarettes and e-cigarettes. If you need help quitting, ask your health care provider. You may shower after 24 hours, but Do not take baths, swim, or use a hot tub for 1 week.  Do not drink alcohol for 24 hours after your procedure. Keep all follow-up visits as told by your health care provider. This is important. Contact a health care provider if: You have redness, mild swelling, or pain around your puncture site. You have fluid or blood coming from your puncture site that stops after applying firm pressure to the area. Your puncture site feels warm to the touch. You have pus or a bad smell coming from your puncture site. You have a fever. You have chest pain or discomfort that spreads to your neck, jaw, or arm. You have chest pain that is worse with lying on your back or taking a deep breath. You are sweating a lot. You feel nauseous. You have a fast or irregular heartbeat. You have shortness of breath. You are dizzy or light-headed and feel the need to lie down. You have pain or numbness in the arm or leg closest to your puncture site. Get help right away if: Your puncture site suddenly swells. Your puncture site is bleeding and the bleeding does not stop after applying firm pressure to the  area. These symptoms may represent a serious problem that is an emergency. Do not wait to see if the symptoms will go away. Get medical help right away. Call your local emergency services (911 in the [place] Do not drive yourself to the hospital. Summary After the procedure, it is normal to have bruising and tenderness at the puncture site in your groin, neck, or forearm. Check your puncture site every day for signs of infection. Get help right away if your puncture site is bleeding and the bleeding does not stop after applying firm pressure to the area. This is a medical emergency. This information is not intended to replace advice given to you by your health care provider.  Make sure you discuss any questions you have with your health care provider.

## 2022-12-02 NOTE — Progress Notes (Signed)
  Electrophysiology Office Note:   Date:  12/02/2022  ID:  Susan Bell, DOB [birth year], MRN [id number]  Primary Cardiologist: Rollene Rotunda, MD Electrophysiologist: Dionisios Ricci Jorja Loa, MD      History of Present Illness:   Susan Bell is a 82 y.o. female with h/o hyperlipidemia, hypothyroidism, atrial fibrillation seen today for routine electrophysiology followup.  Since last being seen in our clinic the patient reports continued episodes of atrial fibrillation.  She had a recent cardioversion 11/02/2022 due to recurrent atrial fibrillation.  She is post ablation 09/16/2020.  She is tried multiple medications.  She did not tolerate amiodarone and went back into atrial fibrillation while on dofetilide, though dofetilide was started prior to her ablation.  She would prefer a rhythm control strategy and like to avoid long-term medications.  she denies chest pain, palpitations, dyspnea, PND, orthopnea, nausea, vomiting, dizziness, syncope, edema, weight gain, or early satiety.   Review of systems complete and found to be negative unless listed in HPI.   EP Information / Studies Reviewed:    EKG is ordered today. Personal review as below.  EKG Interpretation Date/Time:  Friday December 02 2022 12:14:17 EDT Ventricular Rate:  67 PR Interval:  208 QRS Duration:  144 QT Interval:  416 QTC Calculation: 439 R Axis:   -13  Text Interpretation: Normal sinus rhythm Left bundle branch block When compared with ECG of 08-Nov-2022 07:54, No significant change was found Confirmed by Damont Balles ([id number]) on 12/02/2022 12:24:39 PM     Risk Assessment/Calculations:    CHA2DS2-VASc Score = 5   This indicates a 7.2% annual risk of stroke. The patient's score is based upon: CHF History: 0 HTN History: 1 Diabetes History: 0 Stroke History: 0 Vascular Disease History: 1 Age Score: 2 Gender Score: 1     Physical Exam:   VS:  BP (!) 152/80   Pulse 67   Ht 5\' 4"  (1.626 m)   Wt 152 lb  (68.9 kg)   SpO2 98%   BMI 26.09 kg/m    Wt Readings from Last 3 Encounters:  12/02/22 152 lb (68.9 kg)  11/02/22 152 lb 12.8 oz (69.3 kg)  09/19/22 152 lb 9.6 oz (69.2 kg)     GEN: Well nourished, well developed in no acute distress NECK: No JVD; No carotid bruits CARDIAC: Regular rate and rhythm, no murmurs, rubs, gallops RESPIRATORY:  Clear to auscultation without rales, wheezing or rhonchi  ABDOMEN: Soft, non-tender, non-distended EXTREMITIES:  No edema; No deformity   ASSESSMENT AND PLAN:    1.  Persistent atrial fibrillation: Post ablation 09/16/2020.  Has failed dofetilide due to QT prolongation, amiodarone discontinued due to thyroid dysfunction.  She would benefit from rhythm control.  Due to that, we Casmir Auguste plan for ablation.  Risk and benefits have been discussed.  She understands the risks and is agreed to the procedure.  2.  Secondary hypercoagulable state: Currently on Eliquis for atrial fibrillation  3.  Hypertension: Usually well-controlled.  Mildly elevated today.  No changes.  4.  Obstructive sleep apnea: CPAP compliance encouraged  5.  Coronary artery disease: Elevated calcium score on CT.  Continue statin  Follow up with Dr. Elberta Fortis as usual post procedure  Signed, Heydi Swango Jorja Loa, MD

## 2022-12-14 DIAGNOSIS — M1712 Unilateral primary osteoarthritis, left knee: Secondary | ICD-10-CM | POA: Diagnosis not present

## 2022-12-21 ENCOUNTER — Telehealth: Payer: Self-pay | Admitting: Cardiology

## 2022-12-21 NOTE — Telephone Encounter (Signed)
Left message to call back  

## 2022-12-21 NOTE — Telephone Encounter (Signed)
Patient is requesting to speak with Roanna Raider, RN, regarding scheduling ablation.

## 2022-12-23 NOTE — Telephone Encounter (Signed)
Pt calling to report they are moving her dtr's shoulder surgery up to 10/1. She will not be able to go for ablation until November or after. Aware that it may lead [place] to go w/ next February or after d/t no more availability after November. Will be in touch to schedule her procedure. She is agreeable to next year if need be and states she is not having any issues at this time.

## 2022-12-23 NOTE — Telephone Encounter (Signed)
Pt returning nurses call regarding Ablation. Please advise

## 2023-01-03 DIAGNOSIS — H5310 Unspecified subjective visual disturbances: Secondary | ICD-10-CM | POA: Diagnosis not present

## 2023-01-03 DIAGNOSIS — H40013 Open angle with borderline findings, low risk, bilateral: Secondary | ICD-10-CM | POA: Diagnosis not present

## 2023-01-06 ENCOUNTER — Other Ambulatory Visit (HOSPITAL_COMMUNITY): Payer: Self-pay | Admitting: Cardiology

## 2023-01-19 DIAGNOSIS — Z7901 Long term (current) use of anticoagulants: Secondary | ICD-10-CM | POA: Diagnosis not present

## 2023-01-19 DIAGNOSIS — Z23 Encounter for immunization: Secondary | ICD-10-CM | POA: Diagnosis not present

## 2023-01-19 DIAGNOSIS — I2510 Atherosclerotic heart disease of native coronary artery without angina pectoris: Secondary | ICD-10-CM | POA: Diagnosis not present

## 2023-01-19 DIAGNOSIS — I5022 Chronic systolic (congestive) heart failure: Secondary | ICD-10-CM | POA: Diagnosis not present

## 2023-01-19 DIAGNOSIS — D692 Other nonthrombocytopenic purpura: Secondary | ICD-10-CM | POA: Diagnosis not present

## 2023-01-19 DIAGNOSIS — E785 Hyperlipidemia, unspecified: Secondary | ICD-10-CM | POA: Diagnosis not present

## 2023-01-19 DIAGNOSIS — I4891 Unspecified atrial fibrillation: Secondary | ICD-10-CM | POA: Diagnosis not present

## 2023-01-19 DIAGNOSIS — I110 Hypertensive heart disease with heart failure: Secondary | ICD-10-CM | POA: Diagnosis not present

## 2023-01-19 DIAGNOSIS — I480 Paroxysmal atrial fibrillation: Secondary | ICD-10-CM | POA: Diagnosis not present

## 2023-01-19 DIAGNOSIS — E871 Hypo-osmolality and hyponatremia: Secondary | ICD-10-CM | POA: Diagnosis not present

## 2023-01-19 DIAGNOSIS — E039 Hypothyroidism, unspecified: Secondary | ICD-10-CM | POA: Diagnosis not present

## 2023-01-19 DIAGNOSIS — D6869 Other thrombophilia: Secondary | ICD-10-CM | POA: Diagnosis not present

## 2023-01-19 DIAGNOSIS — F418 Other specified anxiety disorders: Secondary | ICD-10-CM | POA: Diagnosis not present

## 2023-02-06 DIAGNOSIS — Z0000 Encounter for general adult medical examination without abnormal findings: Secondary | ICD-10-CM | POA: Diagnosis not present

## 2023-02-06 DIAGNOSIS — Z1389 Encounter for screening for other disorder: Secondary | ICD-10-CM | POA: Diagnosis not present

## 2023-02-23 DIAGNOSIS — M25512 Pain in left shoulder: Secondary | ICD-10-CM | POA: Diagnosis not present

## 2023-03-16 DIAGNOSIS — Z7901 Long term (current) use of anticoagulants: Secondary | ICD-10-CM | POA: Diagnosis not present

## 2023-03-16 DIAGNOSIS — D6869 Other thrombophilia: Secondary | ICD-10-CM | POA: Diagnosis not present

## 2023-03-16 DIAGNOSIS — I480 Paroxysmal atrial fibrillation: Secondary | ICD-10-CM | POA: Diagnosis not present

## 2023-03-16 DIAGNOSIS — R4189 Other symptoms and signs involving cognitive functions and awareness: Secondary | ICD-10-CM | POA: Diagnosis not present

## 2023-03-16 DIAGNOSIS — T148XXA Other injury of unspecified body region, initial encounter: Secondary | ICD-10-CM | POA: Diagnosis not present

## 2023-03-21 ENCOUNTER — Other Ambulatory Visit: Payer: Self-pay | Admitting: Cardiology

## 2023-03-21 ENCOUNTER — Ambulatory Visit (HOSPITAL_COMMUNITY): Payer: Medicare Other | Admitting: Physician Assistant

## 2023-03-28 ENCOUNTER — Telehealth (HOSPITAL_COMMUNITY): Payer: Self-pay | Admitting: *Deleted

## 2023-03-28 ENCOUNTER — Other Ambulatory Visit (HOSPITAL_COMMUNITY): Payer: Self-pay | Admitting: *Deleted

## 2023-03-28 MED ORDER — DILTIAZEM HCL 30 MG PO TABS: ORAL_TABLET | ORAL | 1 refills | Status: DC

## 2023-03-28 NOTE — Telephone Encounter (Signed)
Patient called in stating she went back into afib this morning. HRs in the 120-140s. BP 126/78. She has PRN cardizem to use for elevated rates and will increase metoprolol to 1/2 tablet BID while in afib. If still in afib end of week will call to be seen in office. Pt in agreement. Reminded to miss no doses of DOAC.

## 2023-04-10 ENCOUNTER — Telehealth: Payer: Self-pay | Admitting: Cardiology

## 2023-04-10 DIAGNOSIS — I4819 Other persistent atrial fibrillation: Secondary | ICD-10-CM

## 2023-04-10 NOTE — Telephone Encounter (Signed)
 Patient would like an update on where she stands for an ablation procedure

## 2023-04-11 NOTE — Telephone Encounter (Signed)
 Returned call to pt.  Scheduled her to come in and see Dr. Inocencio 1/28, CT scheduled for 2/17, ablation held for 3/11. Pt aware will confirm proceeding with procedure at upcoming appt, and will give instruction letters then. Patient verbalized understanding and agreeable to plan.

## 2023-05-02 ENCOUNTER — Other Ambulatory Visit: Payer: Self-pay

## 2023-05-02 ENCOUNTER — Ambulatory Visit: Payer: Medicare Other | Attending: Cardiology | Admitting: Cardiology

## 2023-05-02 ENCOUNTER — Encounter: Payer: Self-pay | Admitting: Cardiology

## 2023-05-02 VITALS — BP 163/84 | HR 59 | Ht 64.0 in | Wt 148.0 lb

## 2023-05-02 DIAGNOSIS — D6869 Other thrombophilia: Secondary | ICD-10-CM

## 2023-05-02 DIAGNOSIS — G4733 Obstructive sleep apnea (adult) (pediatric): Secondary | ICD-10-CM

## 2023-05-02 DIAGNOSIS — I10 Essential (primary) hypertension: Secondary | ICD-10-CM | POA: Diagnosis not present

## 2023-05-02 DIAGNOSIS — I4819 Other persistent atrial fibrillation: Secondary | ICD-10-CM | POA: Diagnosis not present

## 2023-05-02 NOTE — Progress Notes (Signed)
  Electrophysiology Office Note:   Date:  05/02/2023  ID:  Susan Bell, DOB [birth year], MRN [id number]  Primary Cardiologist: Rollene Rotunda, MD Primary Heart Failure: None Electrophysiologist: Mozelle Remlinger Jorja Loa, MD      History of Present Illness:   Susan Bell is a 83 y.o. female with h/o hyperlipidemia, hypothyroidism, atrial fibrillation seen today for routine electrophysiology followup.   Since last being seen in our clinic the patient reports doing well.  She continues to have episodes of atrial fibrillation which make her feel fatigued and short of breath.  She has plans for upcoming ablation.  Aside from her atrial fibrillation, she has no acute complaints.  she denies chest pain, palpitations, dyspnea, PND, orthopnea, nausea, vomiting, dizziness, syncope, edema, weight gain, or early satiety.   Review of systems complete and found to be negative unless listed in HPI.   EP Information / Studies Reviewed:    EKG is ordered today. Personal review as below.  EKG Interpretation Date/Time:  Tuesday May 02 2023 16:46:55 EST Ventricular Rate:  59 PR Interval:  218 QRS Duration:  146 QT Interval:  446 QTC Calculation: 441 R Axis:   -2  Text Interpretation: Sinus bradycardia with 1st degree A-V block Left bundle branch block When compared with ECG of 02-Dec-2022 12:14, Inverted T waves have replaced nonspecific T wave abnormality in Inferior leads Confirmed by Oren Barella ([id number]) on 05/02/2023 4:47:58 PM     Risk Assessment/Calculations:    CHA2DS2-VASc Score = 5   This indicates a 7.2% annual risk of stroke. The patient's score is based upon: CHF History: 0 HTN History: 1 Diabetes History: 0 Stroke History: 0 Vascular Disease History: 1 Age Score: 2 Gender Score: 1            Physical Exam:   VS:  BP (!) 180/94 (BP Location: Left Arm, Patient Position: Sitting, Cuff Size: Normal)   Pulse (!) 59   Ht 5\' 4"  (1.626 m)   Wt 148 lb (67.1 kg)   SpO2  98%   BMI 25.40 kg/m    Wt Readings from Last 3 Encounters:  05/02/23 148 lb (67.1 kg)  12/02/22 152 lb (68.9 kg)  11/02/22 152 lb 12.8 oz (69.3 kg)     GEN: Well nourished, well developed in no acute distress NECK: No JVD; No carotid bruits CARDIAC: Regular rate and rhythm, no murmurs, rubs, gallops RESPIRATORY:  Clear to auscultation without rales, wheezing or rhonchi  ABDOMEN: Soft, non-tender, non-distended EXTREMITIES:  No edema; No deformity   ASSESSMENT AND PLAN:    1.  Persistent atrial fibrillation: Post ablation 09/16/2020.  Failed dofetilide due to QT prolongation.  Has plans for repeat ablation 06/13/2023.  All questions were answered today  2.  Secondary hypercoagulable state: Currently on Eliquis for atrial fibrillation  3.  Hypertension: Elevated today.  Well-controlled at home.  No changes.  4.  Obstructive sleep apnea: CPAP compliance encouraged  5.  Elevated coronary calcium: Continue statin  Follow up with Dr. Elberta Fortis as usual post procedure  Signed, Perel Hauschild Jorja Loa, MD

## 2023-05-05 ENCOUNTER — Encounter: Payer: Self-pay | Admitting: Podiatry

## 2023-05-05 ENCOUNTER — Ambulatory Visit (INDEPENDENT_AMBULATORY_CARE_PROVIDER_SITE_OTHER): Payer: Medicare Other | Admitting: Podiatry

## 2023-05-05 DIAGNOSIS — Z7901 Long term (current) use of anticoagulants: Secondary | ICD-10-CM

## 2023-05-05 DIAGNOSIS — L84 Corns and callosities: Secondary | ICD-10-CM

## 2023-05-05 DIAGNOSIS — L600 Ingrowing nail: Secondary | ICD-10-CM | POA: Diagnosis not present

## 2023-05-05 MED ORDER — MUPIROCIN 2 % EX OINT: 1.0000 | TOPICAL_OINTMENT | Freq: Two times a day (BID) | CUTANEOUS | 2 refills | Status: DC

## 2023-05-05 MED ORDER — CEPHALEXIN 500 MG PO CAPS: 500.0000 mg | ORAL_CAPSULE | Freq: Three times a day (TID) | ORAL | 0 refills | Status: DC

## 2023-05-05 NOTE — Progress Notes (Unsigned)
Subjective:   Patient ID: Susan Bell, female   DOB: 83 y.o.   MRN: [id number]   HPI Chief Complaint  Patient presents with   Nail Problem    RM#13 Right big toe ingrown and left big toe nail problem hasn't come off.   83 year old female presents the office today with concerns of a painful ingrown toenail right lateral hallux toenail.  No drainage or pus.  Tender with pressure.  She also has a corn on the medial fifth toe on the right foot which is causing discomfort.  No drainage or pus that she reports.  The left big toenail is also thickened discolored.  Does not cause any pain.  This is an ongoing issue.   Review of Systems  All other systems reviewed and are negative.  Past Medical History:  Diagnosis Date   Allergy    seasonal   Anemia    Arthritis    Cataract    bilateral removed   Celiac disease    Eczema    H/O transfusion of platelets    Hyperlipidemia    Hypothyroidism    OSA (obstructive sleep apnea)    Osteopenia    Paroxysmal atrial fibrillation (HCC)    Shingles    Vitamin D deficiency     Past Surgical History:  Procedure Laterality Date   ATRIAL FIBRILLATION ABLATION N/A 09/16/2020   Procedure: ATRIAL FIBRILLATION ABLATION;  Surgeon: Regan Lemming, MD;  Location: [facility] INVASIVE CV LAB;  Service: Cardiovascular;  Laterality: N/A;   CARDIOVERSION N/A 12/14/2017   Procedure: CARDIOVERSION;  Surgeon: Thurmon Fair, MD;  Location: [facility] ENDOSCOPY;  Service: Cardiovascular;  Laterality: N/A;   CARDIOVERSION N/A 11/07/2019   Procedure: CARDIOVERSION;  Surgeon: Jake Bathe, MD;  Location: [facility] ENDOSCOPY;  Service: Cardiovascular;  Laterality: N/A;   CARDIOVERSION N/A 03/24/2020   Procedure: CARDIOVERSION;  Surgeon: Meriam Sprague, MD;  Location: [facility] ENDOSCOPY;  Service: Cardiovascular;  Laterality: N/A;   CARDIOVERSION N/A 05/11/2020   Procedure: CARDIOVERSION;  Surgeon: Pricilla Riffle, MD;  Location: [facility] ENDOSCOPY;  Service: Cardiovascular;   Laterality: N/A;   CARDIOVERSION N/A 09/06/2022   Procedure: CARDIOVERSION;  Surgeon: [name], MD;  Location: [facility] INVASIVE CV LAB;  Service: Cardiovascular;  Laterality: N/A;   CARDIOVERSION N/A 11/08/2022   Procedure: CARDIOVERSION;  Surgeon: Quintella Reichert, MD;  Location: [facility] INVASIVE CV LAB;  Service: Cardiovascular;  Laterality: N/A;   CATARACT EXTRACTION     bilateral   COLONOSCOPY     DILATION AND CURETTAGE OF UTERUS     UPPER GI ENDOSCOPY       Current Outpatient Medications:    acetaminophen (TYLENOL) 650 MG CR tablet, Take 1,300 mg by mouth at bedtime as needed for pain., Disp: , Rfl:    alendronate (FOSAMAX) 70 MG tablet, Take 70 mg by mouth every 14 (fourteen) days., Disp: , Rfl:    amLODipine (NORVASC) 10 MG tablet, TAKE 1/2 TABLET BY MOUTH DAILY, Disp: 45 tablet, Rfl: 3   apixaban (ELIQUIS) 5 MG TABS tablet, Take 1 tablet (5 mg total) by mouth 2 (two) times daily., Disp: 180 tablet, Rfl: 2   Ascorbic Acid (VITAMIN C) 500 MG CAPS, Take 1,000 mg by mouth daily., Disp: , Rfl:    brimonidine (ALPHAGAN) 0.15 % ophthalmic solution, Place 1 drop into the left eye in the morning and at bedtime., Disp: , Rfl:    Calcium Carb-Cholecalciferol (CALCIUM 600 + D PO), Take 1 tablet by mouth daily., Disp: , Rfl:  cephALEXin (KEFLEX) 500 MG capsule, Take 1 capsule (500 mg total) by mouth 3 (three) times daily., Disp: 21 capsule, Rfl: 0   Cholecalciferol (VITAMIN D3) 10 MCG (400 UNIT) CAPS, Take 400 Units by mouth daily with breakfast., Disp: , Rfl:    diltiazem (CARDIZEM) 30 MG tablet, Take 1 tablet every 4 hours AS NEEDED for AFIB heart rate >100 as long as top BP >100., Disp: 30 tablet, Rfl: 1   dorzolamide-timolol (COSOPT) 22.3-6.8 MG/ML ophthalmic solution, Place 1 drop into the left eye at bedtime., Disp: , Rfl:    fluorometholone (FML) 0.1 % ophthalmic suspension, Place 1 drop into the left eye 2 (two) times daily., Disp: , Rfl:    levothyroxine (SYNTHROID) 88 MCG tablet,  Take 88 mcg by mouth daily before breakfast., Disp: , Rfl:    loratadine (CLARITIN) 10 MG tablet, Take 10 mg by mouth at bedtime., Disp: , Rfl:    losartan (COZAAR) 50 MG tablet, TAKE 1 TABLET BY MOUTH EVERY DAY, Disp: 90 tablet, Rfl: 2   meclizine (ANTIVERT) 25 MG tablet, Take 1 tablet (25 mg total) by mouth 3 (three) times daily as needed for dizziness., Disp: 30 tablet, Rfl: 0   Menthol-Methyl Salicylate (SALONPAS PAIN RELIEF PATCH EX), Apply 0.5 patches topically daily as needed (for pain)., Disp: , Rfl:    metoprolol succinate (TOPROL-XL) 50 MG 24 hr tablet, Take 0.5 tablets (25 mg total) by mouth daily. May take extra 1/2 tablet daily for breakthrough afib (Patient taking differently: Take 12.5 mg by mouth 2 (two) times daily. May take extra 1/2 tablet daily for breakthrough afib), Disp: 90 tablet, Rfl: 3   Multiple Vitamins-Minerals (ONE-A-DAY WOMENS 50+ ADVANTAGE) TABS, Take 1 tablet by mouth daily with breakfast., Disp: , Rfl:    mupirocin ointment (BACTROBAN) 2 %, Apply 1 Application topically 2 (two) times daily., Disp: 30 g, Rfl: 2   PATADAY 0.2 % SOLN, Place 1 drop into both eyes daily as needed (allergies). , Disp: , Rfl:    PRILOSEC OTC 20 MG tablet, Take 20 mg by mouth at bedtime., Disp: , Rfl:    rosuvastatin (CRESTOR) 10 MG tablet, Take 10 mg by mouth at bedtime., Disp: , Rfl: 3   SYSTANE ULTRA 0.4-0.3 % SOLN, Place 1 drop into both eyes daily as needed (dry eyes). , Disp: , Rfl:    traMADol (ULTRAM) 50 MG tablet, Take 25 mg by mouth daily as needed for moderate pain., Disp: , Rfl:    UNABLE TO FIND, Take 2 tablets by mouth daily. Med Name: Azo  Cranberry, Disp: , Rfl:    urea (URE-NA) 15 g PACK oral packet, Take 15 g by mouth 2 (two) times daily. (Patient taking differently: Take 15 g by mouth 2 (two) times a week.), Disp: 60 packet, Rfl: 0   venlafaxine (EFFEXOR) 37.5 MG tablet, Take 0.5 tablets by mouth daily., Disp: , Rfl:   Allergies  Allergen Reactions   Atorvastatin Other  (See Comments)    Knee pain   Estelle June [Iron Polysaccharide] Other (See Comments)    Severe constipation    Gluten Meal Other (See Comments)    Constipation and upset stomach Celiac disease   Povidone Iodine Itching and Other (See Comments)    With prolonged usage, itching           Objective:  Physical Exam  General: AAO x3, NAD  Dermatological: Incurvation present to right lateral hallux nail border with localized edema no tenderness palpation or any drainage or pus noted  today.  There is a preulcerative callus noted on the medial aspect of right fifth toe.  There is no ulceration identified but there was some dried blood under the callus and is preulcerative.  Noted edema but there is some slight erythema around the area.  No fluctuation or crepitation redness, red.  Left hallux toenail remains hypertrophic, dystrophic and brittle with some blood debris present.  Vascular: Dorsalis Pedis artery and Posterior Tibial artery pedal pulses are 2/4 bilateral with immedate capillary fill time.  There is no pain with calf compression, swelling, warmth, erythema.   Neruologic: Grossly intact via light touch bilateral.l.   Musculoskeletal: Tenderness mostly to the right fifth toe corn, ingrown toenail right hallux      Assessment:   83 year old female right lateral hallux ingrown toenail; preulcerative callus right fifth toe     Plan:  -Treatment options discussed including all alternatives, risks, and complications -Etiology of symptoms were discussed  Ingrown toenail right lateral hallux -At this time, the patient is requesting partial nail removal with chemical matricectomy to the symptomatic portion of the nail. Risks and complications were discussed with the patient for which they understand and written consent was obtained. Under sterile conditions a total of 3 mL of a mixture of 2% lidocaine plain and 0.5% Marcaine plain was infiltrated in a hallux block fashion. Once  anesthetized, the skin was prepped in sterile fashion. A tourniquet was then applied. Next the lateral aspect of hallux nail border was then sharply excised making sure to remove the entire offending nail border. Once the nails were ensured to be removed area was debrided and the underlying skin was intact. There is no purulence identified in the procedure. Next phenol was then applied under standard conditions and copiously irrigated. Silvadene was applied. A dry sterile dressing was applied. After application of the dressing the tourniquet was removed and there is found to be an immediate capillary refill time to the digit. The patient tolerated the procedure well any complications. Post procedure instructions were discussed the patient for which he verbally understood. Discussed signs/symptoms of infection and directed to call the office immediately should any occur or go directly to the emergency room. In the meantime, encouraged to call the office with any questions, concerns, changes symptoms.  Preulcerative callus right fifth toe -Sharply debrided any complications or bleeding.  Offloading.  Return in about 2 weeks (around 05/19/2023) for nail check; foot pain.  Vivi Barrack DPM

## 2023-05-05 NOTE — Patient Instructions (Signed)

## 2023-05-10 ENCOUNTER — Telehealth: Payer: Self-pay | Admitting: Cardiology

## 2023-05-10 NOTE — Telephone Encounter (Signed)
 Pt c/o BP issue: STAT if pt c/o blurred vision, one-sided weakness or slurred speech  1. What are your last 5 BP readings? 175/73, 156/65, 152/69, 144/61 and 151/60 it been as high 184/6801  2. Are you having any other symptoms (ex. Dizziness, headache, blurred vision, passed out)?  No symptoms  3. What is your BP issue? Patient said Dr Inocencio told her to contact Dr Lavona about her blood pressure running high

## 2023-05-11 NOTE — Telephone Encounter (Signed)
 Pt has been scheduled to see Gwenith Lente on 05/12/2023.

## 2023-05-12 ENCOUNTER — Ambulatory Visit: Payer: Medicare Other | Attending: Nurse Practitioner | Admitting: Emergency Medicine

## 2023-05-12 ENCOUNTER — Encounter: Payer: Self-pay | Admitting: Emergency Medicine

## 2023-05-12 VITALS — BP 156/64 | HR 62 | Ht 62.0 in | Wt 149.2 lb

## 2023-05-12 DIAGNOSIS — I10 Essential (primary) hypertension: Secondary | ICD-10-CM | POA: Diagnosis not present

## 2023-05-12 DIAGNOSIS — I4819 Other persistent atrial fibrillation: Secondary | ICD-10-CM | POA: Insufficient documentation

## 2023-05-12 DIAGNOSIS — G4733 Obstructive sleep apnea (adult) (pediatric): Secondary | ICD-10-CM | POA: Diagnosis not present

## 2023-05-12 MED ORDER — LOSARTAN POTASSIUM 100 MG PO TABS: 100.0000 mg | ORAL_TABLET | Freq: Every day | ORAL | 3 refills | Status: DC

## 2023-05-12 NOTE — Patient Instructions (Addendum)
 Medication Instructions:  Increase Losartan  100 mg daily   *If you need a refill on your cardiac medications before your next appointment, please call your pharmacy*   Lab Work: NONE ordered at this time of appointment   Testing/Procedures: NONE ordered at this time of appointment   Follow-Up: At [facility], you and your health needs are our priority.  As part of our continuing mission to provide you with exceptional heart care, we have created designated Provider Care Teams.  These Care Teams include your primary Cardiologist (physician) and Advanced Practice Providers (APPs -  Physician Assistants and Nurse Practitioners) who all work together to provide you with the care you need, when you need it.  We recommend signing up for the patient portal called MyChart.  Sign up information is provided on this After Visit Summary.  MyChart is used to connect with patients for Virtual Visits (Telemedicine).  Patients are able to view lab/test results, encounter notes, upcoming appointments, etc.  Non-urgent messages can be sent to your provider as well.   To learn more about what you can do with MyChart, go to [email or web address].    Your next appointment:   4-6 week(s)  Provider:   Lum Louis NP    Other Instructions

## 2023-05-12 NOTE — Progress Notes (Signed)
 Cardiology Office Note:    Date:  05/12/2023  ID:  Susan Bell, DOB Sep 10, 1940, MRN [id number] PCP: Onita Rush, MD  [facility] Providers Cardiologist:  Lynwood Schilling, MD Electrophysiologist:  Will Gladis Norton, MD  Sleep Medicine:  Wilbert Bihari, MD       Patient Profile:      Susan Bell is a 83 y.o. female with visit-pertinent history of hyperlipidemia, hypothyroidism, atrial fibrillation  She has history of atrial fibrillation all successfully cardioverted in [facility] ER on 10/26/2017 for new onset A-fib.  She returned to A-fib clinic on 11/03/2017 back in A-fib and was unaware of arrhythmia.  Sleep study done 12/01/2017 showing OSA, CPAP was started.  She had an echocardiogram that showed EF 35% with diffuse hypokinesis.  Lexiscan  Myoview  did not show any ischemia but was an indeterminate test.  Echo in July 2020 showed normalization of EF.  She is s/p ablation for persistent atrial fibrillation on June 2022.  She failed dofetilide  due to QT prolongation, amiodarone  discontinued due to thyroid  dysfunction. She had a recent cardioversion 11/02/2022 due to recurrent atrial fibrillation.  She has plans for repeat ablation 06/13/2023.      History of Present Illness:  Discussed the use of AI scribe software for clinical note transcription with the patient, who gave verbal consent to proceed.  Susan Bell is a 83 y.o. female who returns for hypertension.  The patient, with a history of atrial fibrillation, has been under the care of AFib clinic over the last few years.  Last seen by general cardiology in 2022.  She currently lives with her daughter who helps with her medications and appointments.  However, the patient's main concern during this visit is her consistently high blood pressure. Over several office visits, her blood pressure has consistently been in the 150s and 160s.  She does not routinely take her blood pressure at home until over the past week where her  average blood pressure were in the 150s.  She is currently on a small dose of amlodipine , losartan  50mg , and metoprolol -XL 12.5 mg twice daily, which is primarily for her AFib.  In addition to her AFib and hypertension, the patient has a sodium retention issue and is on urea  packets three times a day. She also uses a CPAP machine regularly. The patient does not take any herbal medications, except for cranberry to prevent urinary tract infections. She reports occasional dizziness and lightheadedness but denies any chest pain or shortness of breath.    Review of Systems  Constitutional: Negative for weight gain and weight loss.  Cardiovascular:  Negative for chest pain, claudication, dyspnea on exertion, irregular heartbeat, leg swelling, near-syncope, orthopnea, palpitations, paroxysmal nocturnal dyspnea and syncope.  Respiratory:  Negative for cough, hemoptysis and shortness of breath.   Gastrointestinal:  Negative for abdominal pain, hematochezia and melena.  Genitourinary:  Negative for hematuria.   See HPI     Home Medications:    Prior to Admission medications   Medication Sig Start Date End Date Taking? Authorizing Provider  acetaminophen  (TYLENOL ) 650 MG CR tablet Take 1,300 mg by mouth at bedtime as needed for pain.    [provider]  alendronate (FOSAMAX) 70 MG tablet Take 70 mg by mouth every 14 (fourteen) days. 11/24/14   [provider]  amLODipine  (NORVASC ) 10 MG tablet TAKE 1/2 TABLET BY MOUTH DAILY 01/06/23   Schilling Lynwood, MD  apixaban  (ELIQUIS ) 5 MG TABS tablet Take 1 tablet (5 mg total) by  mouth 2 (two) times daily. 09/19/22   Fenton, Clint R, PA  Ascorbic Acid  (VITAMIN C) 500 MG CAPS Take 1,000 mg by mouth daily.    [provider]  brimonidine  (ALPHAGAN ) 0.15 % ophthalmic solution Place 1 drop into the left eye in the morning and at bedtime. 07/12/20   [provider]  Calcium  Carb-Cholecalciferol  (CALCIUM  600 + D PO) Take 1 tablet by  mouth daily.    [provider]  cephALEXin  (KEFLEX ) 500 MG capsule Take 1 capsule (500 mg total) by mouth 3 (three) times daily. 05/05/23   Gershon Donnice SAUNDERS, DPM  Cholecalciferol  (VITAMIN D3) 10 MCG (400 UNIT) CAPS Take 400 Units by mouth daily with breakfast.    [provider]  diltiazem  (CARDIZEM ) 30 MG tablet Take 1 tablet every 4 hours AS NEEDED for AFIB heart rate >100 as long as top BP >100. 03/28/23   Fenton, Clint R, PA  dorzolamide -timolol  (COSOPT ) 22.3-6.8 MG/ML ophthalmic solution Place 1 drop into the left eye at bedtime. 06/05/19   [provider]  fluorometholone  (FML) 0.1 % ophthalmic suspension Place 1 drop into the left eye 2 (two) times daily. 07/13/20   [provider]  levothyroxine  (SYNTHROID ) 88 MCG tablet Take 88 mcg by mouth daily before breakfast.    [provider]  loratadine  (CLARITIN ) 10 MG tablet Take 10 mg by mouth at bedtime.    [provider]  losartan  (COZAAR ) 50 MG tablet TAKE 1 TABLET BY MOUTH EVERY DAY 03/21/23   Lavona Agent, MD  meclizine  (ANTIVERT ) 25 MG tablet Take 1 tablet (25 mg total) by mouth 3 (three) times daily as needed for dizziness. 09/24/21   Phebe Fonda RAMAN, MD  Menthol-Methyl Salicylate (SALONPAS PAIN RELIEF PATCH EX) Apply 0.5 patches topically daily as needed (for pain).    [provider]  metoprolol  succinate (TOPROL -XL) 50 MG 24 hr tablet Take 0.5 tablets (25 mg total) by mouth daily. May take extra 1/2 tablet daily for breakthrough afib Patient taking differently: Take 12.5 mg by mouth 2 (two) times daily. May take extra 1/2 tablet daily for breakthrough afib 09/01/22   Fenton, Caliente R, PA  Multiple Vitamins-Minerals (ONE-A-DAY WOMENS 50+ ADVANTAGE) TABS Take 1 tablet by mouth daily with breakfast.    [provider]  mupirocin  ointment (BACTROBAN ) 2 % Apply 1 Application topically 2 (two) times daily. 05/05/23   Gershon Donnice SAUNDERS, DPM  PATADAY 0.2 % SOLN Place 1 drop  into both eyes daily as needed (allergies).  05/14/19   [provider]  PRILOSEC OTC 20 MG tablet Take 20 mg by mouth at bedtime.    [provider]  rosuvastatin  (CRESTOR ) 10 MG tablet Take 10 mg by mouth at bedtime. 09/21/17   [provider]  SYSTANE ULTRA 0.4-0.3 % SOLN Place 1 drop into both eyes daily as needed (dry eyes).  05/14/19   [provider]  traMADol  (ULTRAM ) 50 MG tablet Take 25 mg by mouth daily as needed for moderate pain. 03/11/20   [provider]  UNABLE TO FIND Take 2 tablets by mouth daily. Med Name: Azo  Cranberry    [provider]  urea  (URE-NA) 15 g PACK oral packet Take 15 g by mouth 2 (two) times daily. Patient taking differently: Take 15 g by mouth 2 (two) times a week. 05/10/21   Vann, Jessica U, DO  venlafaxine (EFFEXOR) 37.5 MG tablet Take 0.5 tablets by mouth daily.    [provider]   Studies  Reviewed:   EKG Interpretation Date/Time:  Friday May 12 2023 14:21:46 EST Ventricular Rate:  62 PR Interval:  206 QRS Duration:  140 QT Interval:  422 QTC Calculation: 428 R Axis:   -16  Text Interpretation: Normal sinus rhythm Left ventricular hypertrophy with QRS widening and repolarization abnormality ( Cornell product ) Confirmed by Rana Dixon [phone]) on 05/12/2023 4:44:33 PM    Echocardiogram 10/24/2018 1. The left ventricle has normal systolic function with an ejection  fraction of 60-65%. The cavity size was normal. There is mild concentric  left ventricular hypertrophy. Left ventricular diastolic Doppler  parameters are consistent with  pseudonormalization. Elevated mean left atrial pressure.   2. The right ventricle has normal systolic function. The cavity was  normal. There is no increase in right ventricular wall thickness. Right  ventricular systolic pressure is normal with an estimated pressure of 36.9  mmHg.   3. Left atrial size was mildly dilated.   4. There is mild to moderate  mitral annular calcification present.   5. The aorta is normal in size and structure.   Lexiscan  Myoview  11/27/2017 Nuclear stress EF: 32%. The study is normal. This is an intermediate risk study. The left ventricular ejection fraction is moderately decreased (30-44%). Risk Assessment/Calculations:    CHA2DS2-VASc Score = 5   This indicates a 7.2% annual risk of stroke. The patient's score is based upon: CHF History: 0 HTN History: 1 Diabetes History: 0 Stroke History: 0 Vascular Disease History: 1 Age Score: 2 Gender Score: 1    HYPERTENSION CONTROL Vitals:   05/12/23 1417 05/12/23 1652  BP: (!) 152/62 (!) 156/64    The patient's blood pressure is elevated above target today.  In order to address the patient's elevated BP: A new medication was prescribed today.          Physical Exam:   VS:  BP (!) 156/64 (BP Location: Right Arm, Patient Position: Sitting, Cuff Size: Normal)   Pulse 62   Ht 5' 2 (1.575 m)   Wt 149 lb 3.2 oz (67.7 kg)   SpO2 98%   BMI 27.29 kg/m    Wt Readings from Last 3 Encounters:  05/12/23 149 lb 3.2 oz (67.7 kg)  05/02/23 148 lb (67.1 kg)  12/02/22 152 lb (68.9 kg)    Constitutional:      Appearance: Normal and healthy appearance. Not in distress.  HENT:     Head: Normocephalic.  Neck:     Vascular: JVD normal.  Pulmonary:     Effort: Pulmonary effort is normal.     Breath sounds: Normal breath sounds.  Chest:     Chest wall: Not tender to palpatation.  Cardiovascular:     PMI at left midclavicular line. Normal rate. Regular rhythm. Normal S1. Normal S2.      Murmurs: There is no murmur.     No gallop.  No click. No rub.  Pulses:    Intact distal pulses.  Edema:    Peripheral edema absent.  Musculoskeletal: Normal range of motion.     Cervical back: Normal range of motion and neck supple. Skin:    General: Skin is warm and dry.  Neurological:     General: No focal deficit present.     Mental Status: Alert, oriented to  person, place, and time and oriented to person, place and time.  Psychiatric:        Mood and Affect: Mood and affect normal.        Behavior: Behavior  is cooperative.        Thought Content: Thought content normal.        Assessment and Plan:  Hypertension Blood pressure 152/62 and repeat 156/64.  Blood pressure at home over the past week and over the past several office visits has been in the 150s She is overall asymptomatic without angina, dyspnea, syncope, lightheadedness, dizziness. Plan will to be to increase her losartan  from 50 mg to 100 mg once daily.  Patient and her daughter will send her average blood pressure over the next 2 weeks to me via MyChart and if still elevated above goal of greater than 130/80 will increase amlodipine  to 10 mg at that time. -Increase losartan  from 50 mg to 100 mg.  Continue amlodipine  5 mg daily and metoprolol  XL 12.5 mg twice daily -She will return in 4 weeks for blood pressure check -Encouraged heart healthy dieting and daily exercise  Atrial fibrillation EKG today shows normal sinus rhythm with no ST or T wave changes She is without any symptoms of recurrent A-fib today Managed by atrial fibrillation clinic -Continue metoprolol  XL 25 mg twice daily and Cardizem  30 mg as needed -Continue Eliquis  5mg  twice daily  -Scheduled for her ablation 06/13/2023  CHA2DS2-VASc Score = 5 [CHF History: 0, HTN History: 1, Diabetes History: 0, Stroke History: 0, Vascular Disease History: 1, Age Score: 2, Gender Score: 1].  Therefore, the patient's annual risk of stroke is 7.2 %.      Obstructive sleep apnea Adherent to CPAP            Dispo:  Return in about 4 weeks (around 06/09/2023).  Signed, Lum LITTIE Louis, NP

## 2023-05-16 ENCOUNTER — Telehealth (HOSPITAL_COMMUNITY): Payer: Self-pay

## 2023-05-16 NOTE — Telephone Encounter (Signed)
Spoke with patient to complete one month pre-procedure call.    New medical conditions?  Completed one week of antibiotics for right great toe ingrown toenail. Reports area has healed.    Recent hospitalizations or surgeries? NO  Has the patient started any new medications? Losartan increased to 100 mg daily for HTN per cardiology. Reports BP range 130-164/64-80. Patient made aware to contact office to inform of any new medications started.   Any changes in activities of daily living? NO  Pre-procedure testing scheduled: CT on 05/22/23 and plans to get lab work on 05/17/23.   Confirmed patient is taking Eliquis and will continue taking medication before procedure or it may need to be rescheduled.  Confirmed patient is scheduled for Atrial Fibrillation Ablation  on Tuesday, March 11 with Dr. Loman Brooklyn. Instructed patient to arrive at the Main Entrance A at [facility]: [address] [place], [place] [address] and check in at Admitting at 5:30 AM  Advised of plan to go home the same day and will only stay overnight if medically necessary. You MUST have a responsible adult to drive you home and MUST be with you the first 24 hours after you arrive home or your procedure could be cancelled.  Patient verbalized understanding to information provided and is agreeable to proceed with procedure.

## 2023-05-18 DIAGNOSIS — I4819 Other persistent atrial fibrillation: Secondary | ICD-10-CM | POA: Diagnosis not present

## 2023-05-19 ENCOUNTER — Ambulatory Visit: Payer: Medicare Other | Admitting: Podiatry

## 2023-05-19 LAB — BASIC METABOLIC PANEL
BUN/Creatinine Ratio: 40 — ABNORMAL HIGH (ref 12–28)
BUN: 29 mg/dL — ABNORMAL HIGH (ref 8–27)
CO2: 25 mmol/L (ref 20–29)
Calcium: 9.6 mg/dL (ref 8.7–10.3)
Chloride: 98 mmol/L (ref 96–106)
Creatinine, Ser: 0.72 mg/dL (ref 0.57–1.00)
Glucose: 102 mg/dL — ABNORMAL HIGH (ref 70–99)
Potassium: 4.8 mmol/L (ref 3.5–5.2)
Sodium: 136 mmol/L (ref 134–144)
eGFR: 83 mL/min/{1.73_m2} (ref >59)

## 2023-05-19 LAB — CBC
Hematocrit: 34.3 % (ref 34.0–46.6)
Hemoglobin: 11.4 g/dL (ref 11.1–15.9)
MCH: 31.7 pg (ref 26.6–33.0)
MCHC: 33.2 g/dL (ref 31.5–35.7)
MCV: 95 fL (ref 79–97)
Platelets: 371 10*3/uL (ref 150–450)
RBC: 3.6 x10E6/uL — ABNORMAL LOW (ref 3.77–5.28)
RDW: 12.6 % (ref 11.7–15.4)
WBC: 8.2 10*3/uL (ref 3.4–10.8)

## 2023-05-22 ENCOUNTER — Ambulatory Visit (HOSPITAL_COMMUNITY)
Admission: RE | Admit: 2023-05-22 | Discharge: 2023-05-22 | Disposition: A | Discharge status: Home / Self Care | Payer: Medicare Other | Source: Ambulatory Visit | Attending: Internal Medicine | Admitting: Internal Medicine

## 2023-05-22 DIAGNOSIS — I4819 Other persistent atrial fibrillation: Secondary | ICD-10-CM | POA: Insufficient documentation

## 2023-05-22 MED ORDER — IOHEXOL 350 MG/ML SOLN
95.0000 mL | Freq: Once | INTRAVENOUS | Status: AC | PRN
  Administered 2023-05-22: 95 mL via INTRAVENOUS

## 2023-05-24 ENCOUNTER — Other Ambulatory Visit: Payer: Self-pay | Admitting: Podiatry

## 2023-05-24 ENCOUNTER — Encounter: Payer: Self-pay | Admitting: Podiatry

## 2023-05-24 ENCOUNTER — Telehealth: Payer: Self-pay | Admitting: Podiatry

## 2023-05-24 MED ORDER — DOXYCYCLINE HYCLATE 100 MG PO TABS: 100.0000 mg | ORAL_TABLET | Freq: Two times a day (BID) | ORAL | 0 refills | Status: DC

## 2023-05-24 NOTE — Telephone Encounter (Signed)
 Pt called and had ingrown toenail and doing great and was doing great so she cxled her follow up and now toe is looking angry, I transferred to the nurse as Dr Ardelle Anton is not in the office today( its his surgery day) pt is aware of this.

## 2023-05-25 ENCOUNTER — Encounter: Payer: Self-pay | Admitting: Podiatry

## 2023-05-25 ENCOUNTER — Ambulatory Visit (INDEPENDENT_AMBULATORY_CARE_PROVIDER_SITE_OTHER): Payer: Medicare Other | Admitting: Podiatry

## 2023-05-25 DIAGNOSIS — L600 Ingrowing nail: Secondary | ICD-10-CM

## 2023-05-25 NOTE — Progress Notes (Signed)
 Subjective: Chief Complaint  Patient presents with   Nail Problem    RM#13 right great toenail ingrown removed now angry looking,hurting again.   83 year old female presents the above concerns.  She said the toe was healing well however started looking bad.  She has tenderness suggestive of infection and started on doxycycline.  She denies injury or pus to the area does get tender pressure.  She states her daughter wrapped the toe and she feels it gets wrapped tightly.   Objective: AAO x3, NAD DP/PT pulses palpable bilaterally, CRT less than 3 seconds-has good circulation to the toe. Protective sensation intact with Dorann Ou monofilament, vibratory sensation status post partial nail avulsion right lateral nail border.  There is macerated tissue toenail where there is no frank purulence identified.  There are some localized edema and erythema but no ascending cellulitis.  No fluctuation or crepitation.  No malodor. No pain with calf compression, swelling, warmth, erythema  Assessment: Status post partial nail avulsion  Plan: I do think some of her issues are more than tissues become macerated.  Continue soaking Epsom salts twice a day cover with a very small amount of antibiotic ointment during the day but with the area open at nighttime for dry pins at nighttime.  I applied a small Mehta Betadine today which she states she can tolerate for short-term.  Her allergy to this is when she soaks her foot Betadine a couple times a day as it causes a rash.  Finish course of doxycycline.  Recommend to continue with open shoe to avoid excess pressure. Monitor for any clinical signs or symptoms of infection and directed to call the office immediately should any occur or go to the ER.  Return in about 2 weeks (around 06/08/2023) for nail check .  Vivi Barrack DPM

## 2023-05-29 ENCOUNTER — Telehealth: Payer: Self-pay | Admitting: Cardiology

## 2023-05-29 MED ORDER — AMLODIPINE BESYLATE 10 MG PO TABS
10.0000 mg | ORAL_TABLET | Freq: Every day | ORAL | 3 refills | Status: DC
Start: 2023-05-29 — End: 2023-10-31

## 2023-05-29 NOTE — Telephone Encounter (Signed)
 Pt c/o medication issue:  1. Name of Medication:  losartan (COZAAR) 100 MG tablet  2. How are you currently taking this medication (dosage and times per day)?  As prescribed  3. Are you having a reaction (difficulty breathing--STAT)?   4. What is your medication issue?   Patient states her BP is averaging 158/67 58 an doesn't have symptoms, but assumes Losartan isn't working and should be adjusted. She also mentions she's on her second round of antibiotics for an ingrown toenail and would like to know if this will interfere with 3/11 ablation. Please advise.

## 2023-05-29 NOTE — Telephone Encounter (Signed)
 Pt taking the abx for an ingrown toenail that would not heal after removal. Her last dose it tomorrow. She will let us know if has to take more near ablation date.

## 2023-05-29 NOTE — Telephone Encounter (Signed)
 Spoke with the patient and advised on increasing amlodipine to 10 mg daily per Rise Paganini, NP. Patient verbalized understanding. Message sent to Dr. Elberta Fortis and his nurse about her ablation.

## 2023-05-29 NOTE — Telephone Encounter (Signed)
 Per last office visit with Rise Paganini, NP if blood pressures were still elevated he recommended increasing amlodipine to 10 mg daily. Will forward to him to see if this is still the plan.   Patient is currently on doxycycline for one week. Ablation scheduled for 3/11 with Dr. Elberta Fortis.

## 2023-05-31 ENCOUNTER — Ambulatory Visit: Payer: Medicare Other | Admitting: Physician Assistant

## 2023-06-06 ENCOUNTER — Telehealth (HOSPITAL_COMMUNITY): Payer: Self-pay

## 2023-06-06 NOTE — Telephone Encounter (Signed)
 Attempted to reach patient to discuss upcoming procedure, no answer. Left VM for patient to return call.

## 2023-06-06 NOTE — Telephone Encounter (Signed)
 Call placed to patient to discuss upcoming procedure.   CT: completed and acceptable.  Labs: completed and acceptable.   Any recent signs of acute illness or been started on antibiotics? Completed antibiotics for ingrown toenail on 05/29/23 Any diabetic medications to hold? NO Any missed doses of blood thinner? NO Advised patient to continue taking ANTICOAGULANT: Eliquis (Apixaban) without missing any doses.  Medication instructions:  On the morning of your procedure DO NOT take any medication., including Eliquis or the procedure may be rescheduled. Nothing to eat or drink after midnight prior to your procedure.  Confirmed patient is scheduled for Atrial Fibrillation Ablation on Tuesday, March 11 with Dr. Loman Brooklyn. Instructed patient to arrive at the Main Entrance A at [facility]: [address] [place], [place] [address] and check in at Admitting at 5:30 AM  Advised of plan to go home the same day and will only stay overnight if medically necessary. You MUST have a responsible adult to drive you home and MUST be with you the first 24 hours after you arrive home or your procedure could be cancelled.  Patient verbalized understanding to all instructions provided and agreed to proceed with procedure.

## 2023-06-07 NOTE — Telephone Encounter (Signed)
 Received a call from patient's daughter Lurena Joiner with questions regarding day of procedure and aftercare post ablation. All questions answered and she verbalized understanding and appreciation.

## 2023-06-08 ENCOUNTER — Encounter: Payer: Self-pay | Admitting: Emergency Medicine

## 2023-06-12 ENCOUNTER — Encounter: Payer: Self-pay | Admitting: Emergency Medicine

## 2023-06-12 ENCOUNTER — Ambulatory Visit: Payer: Medicare Other | Attending: Emergency Medicine | Admitting: Emergency Medicine

## 2023-06-12 VITALS — BP 154/62 | HR 65 | Ht 62.0 in | Wt 151.0 lb

## 2023-06-12 DIAGNOSIS — G4733 Obstructive sleep apnea (adult) (pediatric): Secondary | ICD-10-CM | POA: Diagnosis not present

## 2023-06-12 DIAGNOSIS — I2510 Atherosclerotic heart disease of native coronary artery without angina pectoris: Secondary | ICD-10-CM | POA: Insufficient documentation

## 2023-06-12 DIAGNOSIS — I10 Essential (primary) hypertension: Secondary | ICD-10-CM | POA: Diagnosis not present

## 2023-06-12 DIAGNOSIS — I4819 Other persistent atrial fibrillation: Secondary | ICD-10-CM | POA: Insufficient documentation

## 2023-06-12 MED ORDER — HYDROCHLOROTHIAZIDE 12.5 MG PO CAPS: 12.5000 mg | ORAL_CAPSULE | Freq: Every day | ORAL | 3 refills | Status: DC

## 2023-06-12 NOTE — Anesthesia Preprocedure Evaluation (Signed)
 Anesthesia Evaluation  Patient identified by MRN, date of birth, ID band Patient awake    Reviewed: Allergy & Precautions, NPO status , Patient's Chart, lab work & pertinent test results, reviewed documented beta blocker date and time   History of Anesthesia Complications Negative for: history of anesthetic complications  Airway Mallampati: I  TM Distance: >3 FB Neck ROM: Full    Dental  (+) Dental Advisory Given   Pulmonary sleep apnea (does not use CPAP)    breath sounds clear to auscultation       Cardiovascular hypertension, Pt. on medications and Pt. on home beta blockers (-) angina + dysrhythmias Atrial Fibrillation  Rhythm:Irregular Rate:Normal  '20 ECHO: EF 60-65%.  1. The cavity size was normal. There is mild concentric LVH. Left ventricular diastolic Doppler parameters are consistent with pseudonormalization. Elevated mean left atrial pressure.   2. The right ventricle has normal systolic function. The cavity was normal. There is no increase in right ventricular wall thickness. Right ventricular systolic pressure is normal with an estimated pressure of 36.9  mmHg.   3. Left atrial size was mildly dilated.   4. There is mild to moderate mitral annular calcification present.     Neuro/Psych negative neurological ROS     GI/Hepatic Neg liver ROS,GERD  Medicated and Controlled,,celiac   Endo/Other  Hypothyroidism    Renal/GU negative Renal ROS     Musculoskeletal   Abdominal   Peds  Hematology eliquis   Anesthesia Other Findings   Reproductive/Obstetrics                             Anesthesia Physical Anesthesia Plan  ASA: 3  Anesthesia Plan: General   Post-op Pain Management: Tylenol PO (pre-op)*   Induction: Intravenous  PONV Risk Score and Plan: 3 and Ondansetron, Dexamethasone and Treatment may vary due to age or medical condition  Airway Management Planned: Oral  ETT  Additional Equipment: None  Intra-op Plan:   Post-operative Plan: Extubation in OR  Informed Consent: I have reviewed the patients History and Physical, chart, labs and discussed the procedure including the risks, benefits and alternatives for the proposed anesthesia with the patient or authorized representative who has indicated his/her understanding and acceptance.     Dental advisory given  Plan Discussed with: CRNA and Surgeon  Anesthesia Plan Comments:         Anesthesia Quick Evaluation

## 2023-06-12 NOTE — Pre-Procedure Instructions (Signed)
 Attempted to call patient regarding procedure instructions.  Left voicemail on the following items: Arrival time 0515 Nothing to eat or drink after midnight No meds AM of procedure Responsible person to drive you home and stay with you for 24 hrs  Have you missed any doses of anti-coagulant Eliquis- should be taken twice a day, if you have missed any doses please let us know.  Don't take dose morning of procedure.

## 2023-06-12 NOTE — Patient Instructions (Signed)
 Medication Instructions:  START TAKING HYDROCHLOROTHIAZIDE 12.5 MG DAILY.   Lab Work: Nutritional therapist IN 2 WEEKS    Testing/Procedures: NONE   Follow-Up: At [facility], you and your health needs are our priority.  As part of our continuing mission to provide you with exceptional heart care, we have created designated Provider Care Teams.  These Care Teams include your primary Cardiologist (physician) and Advanced Practice Providers (APPs -  Physician Assistants and Nurse Practitioners) who all work together to provide you with the care you need, when you need it.    Your next appointment:   2-3 MONTHS  Provider:   Rise Paganini, DNP  Other Instructions:

## 2023-06-12 NOTE — Progress Notes (Addendum)
 Cardiology Office Note:    Date:  06/12/2023  ID:  Susan Bell, DOB 1941-01-21, MRN [id number] PCP: Creola Corn, MD  [facility] Providers Cardiologist:  Rollene Rotunda, MD Electrophysiologist:  Will Jorja Loa, MD  Sleep Medicine:  Armanda Magic, MD       Patient Profile:      Chief Complaint: 1 month follow-up for hypertension  History of Present Illness:  Susan Bell is a 83 y.o. female with visit-pertinent history of hyperlipidemia, hypothyroidism, atrial fibrillation   She has history of atrial fibrillation all successfully cardioverted in the [facility] ER on 10/26/2017 for new onset A-fib.  She returned to A-fib clinic on 11/03/2017 back in A-fib and was unaware of arrhythmia.  Sleep study done 12/01/2017 showing OSA, CPAP was started.  She had an echocardiogram that showed EF 35% with diffuse hypokinesis.  Lexiscan Myoview did not show any ischemia but was an indeterminate test.  Echo in July 2020 showed normalization of EF.  She is s/p ablation for persistent atrial fibrillation on June 2022.  She failed dofetilide due to QT prolongation, amiodarone discontinued due to thyroid dysfunction. She had a recent cardioversion 11/02/2022 due to recurrent atrial fibrillation.    She was last seen in office on 05/12/2023.  She is without any acute cardiovascular concerns or complaints.  Her blood pressure was elevated at 156/64.  Her losartan was increased from 50 mg to 100 mg daily.  She was return in 4 weeks for blood pressure check.  She is scheduled for repeat ablation on 06/13/2023.   Discussed the use of AI scribe software for clinical note transcription with the patient, who gave verbal consent to proceed.  The patient comes into clinic today with her daughter.  She currently lives with her daughter who helps her with medications and appointments.  The patient is without any acute cardiovascular concerns or complaints at this time.  She is without any exertional angina, chest  pain, dyspnea, lightheadedness, dizziness, palpitations, orthopnea, PND, melena, hematochezia.  The patient's blood pressure has been consistently high, ranging in the 150s systolic, despite being on losartan 100mg , metoprolol 12.5 mg twice daily, and amlodipine 10mg . The patient has been monitoring her blood pressure at home and reports that it averages in the high 140s to low 150s.     Review of systems:  Please see the history of present illness. All other systems are reviewed and otherwise negative.     Home Medications:    Current Meds  Medication Sig   acetaminophen (TYLENOL) 650 MG CR tablet Take 1,300 mg by mouth at bedtime.   alendronate (FOSAMAX) 70 MG tablet Take 70 mg by mouth every 14 (fourteen) days.   amLODipine (NORVASC) 10 MG tablet Take 1 tablet (10 mg total) by mouth daily.   apixaban (ELIQUIS) 5 MG TABS tablet Take 1 tablet (5 mg total) by mouth 2 (two) times daily.   brimonidine (ALPHAGAN) 0.15 % ophthalmic solution Place 1 drop into the left eye in the morning and at bedtime.   Calcium Carb-Cholecalciferol (CALCIUM 600 + D PO) Take 1 tablet by mouth daily.   Cholecalciferol (VITAMIN D3) 10 MCG (400 UNIT) CAPS Take 400 Units by mouth daily with breakfast.   Cranberry-Vitamin C-Probiotic (AZO CRANBERRY PO) Take 2 tablets by mouth daily.   diltiazem (CARDIZEM) 30 MG tablet Take 1 tablet every 4 hours AS NEEDED for AFIB heart rate >100 as long as top BP >100.   dorzolamide-timolol (COSOPT) 22.3-6.8 MG/ML ophthalmic solution Place  1 drop into the left eye 2 (two) times daily.   fluorometholone (FML) 0.1 % ophthalmic suspension Place 1 drop into the left eye daily.   hydrochlorothiazide (MICROZIDE) 12.5 MG capsule Take 1 capsule (12.5 mg total) by mouth daily.   levothyroxine (SYNTHROID) 88 MCG tablet Take 88 mcg by mouth daily before breakfast.   loratadine (CLARITIN) 10 MG tablet Take 10 mg by mouth at bedtime.   losartan (COZAAR) 100 MG tablet Take 1 tablet (100 mg total)  by mouth daily.   meclizine (ANTIVERT) 25 MG tablet Take 1 tablet (25 mg total) by mouth 3 (three) times daily as needed for dizziness.   Menthol-Methyl Salicylate (SALONPAS PAIN RELIEF PATCH EX) Apply 0.5 patches topically daily as needed (for pain).   metoprolol succinate (TOPROL-XL) 50 MG 24 hr tablet Take 0.5 tablets (25 mg total) by mouth daily. May take extra 1/2 tablet daily for breakthrough afib   Multiple Vitamins-Minerals (ONE-A-DAY WOMENS 50+ ADVANTAGE) TABS Take 1 tablet by mouth daily with breakfast.   mupirocin ointment (BACTROBAN) 2 % Apply 1 Application topically 2 (two) times daily. (Patient taking differently: Apply 1 Application topically 2 (two) times daily as needed (wound care).)   PATADAY 0.2 % SOLN Place 1 drop into both eyes daily as needed (allergies).    PRILOSEC OTC 20 MG tablet Take 20 mg by mouth at bedtime.   rosuvastatin (CRESTOR) 10 MG tablet Take 10 mg by mouth at bedtime.   SYSTANE ULTRA 0.4-0.3 % SOLN Place 1 drop into both eyes daily as needed (dry eyes).    traMADol (ULTRAM) 50 MG tablet Take 25 mg by mouth daily as needed for moderate pain.   urea (URE-NA) 15 g PACK oral packet Take 15 g by mouth 2 (two) times daily. (Patient taking differently: Take 15 g by mouth every Monday, Wednesday, and Friday.)   venlafaxine (EFFEXOR) 37.5 MG tablet Take 0.5 tablets by mouth daily.   vitamin C (ASCORBIC ACID) 250 MG tablet Take 500 mg by mouth daily.   Studies Reviewed:       Echocardiogram 10/24/2018 1. The left ventricle has normal systolic function with an ejection  fraction of 60-65%. The cavity size was normal. There is mild concentric  left ventricular hypertrophy. Left ventricular diastolic Doppler  parameters are consistent with  pseudonormalization. Elevated mean left atrial pressure.   2. The right ventricle has normal systolic function. The cavity was  normal. There is no increase in right ventricular wall thickness. Right  ventricular systolic pressure  is normal with an estimated pressure of 36.9  mmHg.   3. Left atrial size was mildly dilated.   4. There is mild to moderate mitral annular calcification present.   5. The aorta is normal in size and structure.    Lexiscan Myoview 11/27/2017 Nuclear stress EF: 32%. The study is normal. This is an intermediate risk study. The left ventricular ejection fraction is moderately decreased (30-44%).   Risk Assessment/Calculations:    CHA2DS2-VASc Score = 5   This indicates a 7.2% annual risk of stroke. The patient's score is based upon: CHF History: 0 HTN History: 1 Diabetes History: 0 Stroke History: 0 Vascular Disease History: 1 Age Score: 2 Gender Score: 1    HYPERTENSION CONTROL Vitals:   06/12/23 1457 06/12/23 1535  BP: (!) 156/58 (!) 154/62    The patient's blood pressure is elevated above target today.  In order to address the patient's elevated BP: A new medication was prescribed today.  Physical Exam:   VS:  BP (!) 154/62 (BP Location: Left Arm, Patient Position: Sitting, Cuff Size: Normal)   Pulse 65   Ht 5\' 2"  (1.575 m)   Wt 151 lb (68.5 kg)   BMI 27.62 kg/m    Wt Readings from Last 3 Encounters:  06/12/23 151 lb (68.5 kg)  05/12/23 149 lb 3.2 oz (67.7 kg)  05/02/23 148 lb (67.1 kg)    GEN: Well nourished, well developed in no acute distress NECK: No JVD; No carotid bruits CARDIAC: RRR, no murmurs, rubs, gallops RESPIRATORY:  Clear to auscultation without rales, wheezing or rhonchi  ABDOMEN: Soft, non-tender, non-distended EXTREMITIES:  No edema; No acute deformity      Assessment and Plan:  Hypertension Blood pressure today 156/58 and repeat 154/62 and not adequately controlled with goal of less than 130/80 Blood pressure at home has ranged between 140s to 150s.  She has not seen much benefit with recent increases to amlodipine and losartan. -Plan will be to start hydrochlorothiazide 12.5 mg daily.  Hydrochlorothiazide was chosen as this could  end up being a combination pill along with patient's other antihypertensive medications in order to reduce pill burden -Start hydrochlorothiazide 12.5 mg daily, continue amlodipine 10 mg daily and losartan 100 mg daily.  Continue metoprolol XL 25 mg twice daily -Continue with home blood pressure monitoring and follow-up in 2 months for blood pressure check -BMET in 2 weeks  Atrial fibrillation Post ablation 09/2020.  Failed to Dofetilide due to QT prolongation She is without any symptoms of recurrent atrial fibrillation today Currently managed by A-fib clinic and EP and scheduled for ablation tomorrow -Continue Eliquis 5 mg twice daily.  No bleeding concerns -Continue metoprolol XL 25 mg twice daily and diltiazem 30 mg as needed  CHA2DS2-VASc Score = 5     Obstructive sleep apnea Remains adherent to CPAP  Coronary artery disease / HLD Coronary calcium score 772, (90th percentile) on 06/2017 with LAD and circumflex coronary artery calcification -Today she is without any anginal symptoms, no indication for further ischemic evaluation at this time -HLD has been managed by PCP Dr. Timothy Lasso  -Continue rosuvastatin 10 mg daily            Dispo:  Return in about 2 months (around 08/12/2023).  Signed, Denyce Robert, NP

## 2023-06-13 ENCOUNTER — Ambulatory Visit (HOSPITAL_COMMUNITY)
Admission: RE | Admit: 2023-06-13 | Discharge: 2023-06-13 | Disposition: A | Discharge status: Home / Self Care | Payer: Medicare Other | Attending: Cardiology | Admitting: Cardiology

## 2023-06-13 ENCOUNTER — Ambulatory Visit (HOSPITAL_COMMUNITY): Payer: Self-pay | Admitting: Anesthesiology

## 2023-06-13 ENCOUNTER — Ambulatory Visit (HOSPITAL_COMMUNITY)
Admission: RE | Disposition: A | Discharge status: Home / Self Care | Payer: Self-pay | Source: Home / Self Care | Attending: Cardiology

## 2023-06-13 ENCOUNTER — Other Ambulatory Visit: Payer: Self-pay

## 2023-06-13 ENCOUNTER — Ambulatory Visit (HOSPITAL_BASED_OUTPATIENT_CLINIC_OR_DEPARTMENT_OTHER): Payer: Self-pay | Admitting: Anesthesiology

## 2023-06-13 DIAGNOSIS — I5022 Chronic systolic (congestive) heart failure: Secondary | ICD-10-CM

## 2023-06-13 DIAGNOSIS — I4892 Unspecified atrial flutter: Secondary | ICD-10-CM | POA: Diagnosis not present

## 2023-06-13 DIAGNOSIS — I4891 Unspecified atrial fibrillation: Secondary | ICD-10-CM | POA: Diagnosis not present

## 2023-06-13 DIAGNOSIS — E785 Hyperlipidemia, unspecified: Secondary | ICD-10-CM | POA: Diagnosis not present

## 2023-06-13 DIAGNOSIS — I10 Essential (primary) hypertension: Secondary | ICD-10-CM | POA: Diagnosis not present

## 2023-06-13 DIAGNOSIS — Z7901 Long term (current) use of anticoagulants: Secondary | ICD-10-CM | POA: Diagnosis not present

## 2023-06-13 DIAGNOSIS — G4730 Sleep apnea, unspecified: Secondary | ICD-10-CM | POA: Insufficient documentation

## 2023-06-13 DIAGNOSIS — I4819 Other persistent atrial fibrillation: Secondary | ICD-10-CM | POA: Diagnosis not present

## 2023-06-13 DIAGNOSIS — I110 Hypertensive heart disease with heart failure: Secondary | ICD-10-CM

## 2023-06-13 DIAGNOSIS — Z79899 Other long term (current) drug therapy: Secondary | ICD-10-CM | POA: Diagnosis not present

## 2023-06-13 DIAGNOSIS — E039 Hypothyroidism, unspecified: Secondary | ICD-10-CM

## 2023-06-13 DIAGNOSIS — K219 Gastro-esophageal reflux disease without esophagitis: Secondary | ICD-10-CM | POA: Diagnosis not present

## 2023-06-13 HISTORY — PX: ATRIAL FIBRILLATION ABLATION: EP1191

## 2023-06-13 LAB — POCT ACTIVATED CLOTTING TIME: Activated Clotting Time: 250 s

## 2023-06-13 SURGERY — ATRIAL FIBRILLATION ABLATION
Anesthesia: General

## 2023-06-13 MED ORDER — SUGAMMADEX SODIUM 200 MG/2ML IV SOLN
INTRAVENOUS | Status: DC | PRN
Start: 2023-06-13 — End: 2023-06-13
  Administered 2023-06-13: 200 mg via INTRAVENOUS

## 2023-06-13 MED ORDER — SODIUM CHLORIDE 0.9 % IV SOLN: 250.0000 mL | INTRAVENOUS | Status: DC | PRN

## 2023-06-13 MED ORDER — SODIUM CHLORIDE 0.9 % IV SOLN: INTRAVENOUS | Status: DC

## 2023-06-13 MED ORDER — ONDANSETRON HCL 4 MG/2ML IJ SOLN: 4.0000 mg | Freq: Four times a day (QID) | INTRAMUSCULAR | Status: DC | PRN

## 2023-06-13 MED ORDER — FENTANYL CITRATE (PF) 100 MCG/2ML IJ SOLN
INTRAMUSCULAR | Status: AC
  Filled 2023-06-13: qty 2

## 2023-06-13 MED ORDER — LIDOCAINE 2% (20 MG/ML) 5 ML SYRINGE
INTRAMUSCULAR | Status: DC | PRN
  Administered 2023-06-13: 100 mg via INTRAVENOUS

## 2023-06-13 MED ORDER — PROPOFOL 10 MG/ML IV BOLUS
INTRAVENOUS | Status: DC | PRN
Start: 2023-06-13 — End: 2023-06-13
  Administered 2023-06-13: 150 mg via INTRAVENOUS

## 2023-06-13 MED ORDER — ROCURONIUM BROMIDE 10 MG/ML (PF) SYRINGE
PREFILLED_SYRINGE | INTRAVENOUS | Status: DC | PRN
  Administered 2023-06-13: 50 mg via INTRAVENOUS

## 2023-06-13 MED ORDER — ONDANSETRON HCL 4 MG/2ML IJ SOLN
INTRAMUSCULAR | Status: DC | PRN
  Administered 2023-06-13: 4 mg via INTRAVENOUS

## 2023-06-13 MED ORDER — FENTANYL CITRATE (PF) 250 MCG/5ML IJ SOLN
INTRAMUSCULAR | Status: DC | PRN
  Administered 2023-06-13 (×2): 50 ug via INTRAVENOUS

## 2023-06-13 MED ORDER — ATROPINE SULFATE 1 MG/ML IV SOLN
INTRAVENOUS | Status: DC | PRN
  Administered 2023-06-13: 1 mg via INTRAVENOUS

## 2023-06-13 MED ORDER — HEPARIN SODIUM (PORCINE) 1000 UNIT/ML IJ SOLN
INTRAMUSCULAR | Status: DC | PRN
Start: 2023-06-13 — End: 2023-06-13
  Administered 2023-06-13: 5000 [IU] via INTRAVENOUS
  Administered 2023-06-13: 13000 [IU] via INTRAVENOUS

## 2023-06-13 MED ORDER — HEPARIN (PORCINE) IN NACL 1000-0.9 UT/500ML-% IV SOLN
INTRAVENOUS | Status: DC | PRN
Start: 2023-06-13 — End: 2023-06-13
  Administered 2023-06-13 (×3): 500 mL

## 2023-06-13 MED ORDER — ACETAMINOPHEN 325 MG PO TABS: 650.0000 mg | ORAL_TABLET | ORAL | Status: DC | PRN

## 2023-06-13 MED ORDER — ACETAMINOPHEN 500 MG PO TABS
1000.0000 mg | ORAL_TABLET | Freq: Once | ORAL | Status: AC
  Administered 2023-06-13: 1000 mg via ORAL
  Filled 2023-06-13: qty 2

## 2023-06-13 MED ORDER — SODIUM CHLORIDE 0.9% FLUSH: 3.0000 mL | INTRAVENOUS | Status: DC | PRN

## 2023-06-13 MED ORDER — LACTATED RINGERS IV SOLN
INTRAVENOUS | Status: DC | PRN
Start: 2023-06-13 — End: 2023-06-13

## 2023-06-13 MED ORDER — ATROPINE SULFATE 1 MG/10ML IJ SOSY
PREFILLED_SYRINGE | INTRAMUSCULAR | Status: AC
  Filled 2023-06-13: qty 30

## 2023-06-13 MED ORDER — DEXAMETHASONE SODIUM PHOSPHATE 10 MG/ML IJ SOLN
INTRAMUSCULAR | Status: DC | PRN
  Administered 2023-06-13: 5 mg via INTRAVENOUS

## 2023-06-13 MED ORDER — PROTAMINE SULFATE 10 MG/ML IV SOLN
INTRAVENOUS | Status: DC | PRN
  Administered 2023-06-13: 40 mg via INTRAVENOUS

## 2023-06-13 SURGICAL SUPPLY — 18 items
CABLE PFA RX CATH CONN (CABLE) IMPLANT
CATH EZ STEER NAV 8MM D-F CUR (ABLATOR) IMPLANT
CATH FARAWAVE ABLATION 31 (CATHETERS) IMPLANT
CATH OCTARAY 2.0 F 3-3-3-3-3 (CATHETERS) IMPLANT
CATH SOUNDSTAR ECO 8FR (CATHETERS) IMPLANT
CATH WEBSTER BI DIR CS D-F CRV (CATHETERS) IMPLANT
CLOSURE MYNX CONTROL 6F/7F (Vascular Products) IMPLANT
COVER SWIFTLINK CONNECTOR (BAG) ×2 IMPLANT
DILATOR VESSEL 38 20CM 16FR (INTRODUCER) IMPLANT
GUIDEWIRE INQWIRE 1.5J.035X260 (WIRE) IMPLANT
INQWIRE 1.5J .035X260CM (WIRE) ×1 IMPLANT
KIT VERSACROSS CNCT FARADRIVE (KITS) IMPLANT
PACK EP LF (CUSTOM PROCEDURE TRAY) ×2 IMPLANT
PAD DEFIB RADIO PHYSIO CONN (PAD) ×2 IMPLANT
PATCH CARTO3 (PAD) IMPLANT
SHEATH FARADRIVE STEERABLE (SHEATH) IMPLANT
SHEATH PINNACLE 8F 10CM (SHEATH) IMPLANT
SHEATH PINNACLE 9F 10CM (SHEATH) IMPLANT

## 2023-06-13 NOTE — Anesthesia Postprocedure Evaluation (Signed)
 Anesthesia Post Note  Patient: Susan Bell  Procedure(s) Performed: ATRIAL FIBRILLATION ABLATION     Patient location during evaluation: Phase II Anesthesia Type: General Level of consciousness: awake and alert, patient cooperative and oriented Pain management: pain level controlled Vital Signs Assessment: post-procedure vital signs reviewed and stable Respiratory status: nonlabored ventilation, spontaneous breathing and respiratory function stable Cardiovascular status: blood pressure returned to baseline and stable Postop Assessment: no apparent nausea or vomiting Anesthetic complications: no   There were no known notable events for this encounter.  Last Vitals:  Vitals:   06/13/23 0930 06/13/23 0945  BP: (!) 151/64 (!) 147/66  Pulse: 80 73  Resp: 13 (!) 24  Temp:  36.4 C  SpO2: 95% 95%    Last Pain:  Vitals:   06/13/23 0945  TempSrc:   PainSc: 0-No pain                 Algernon Mundie,E. Yadhira Mckneely

## 2023-06-13 NOTE — Transfer of Care (Signed)
 Immediate Anesthesia Transfer of Care Note  Patient: Susan Bell  Procedure(s) Performed: ATRIAL FIBRILLATION ABLATION  Patient Location: Cath Lab  Anesthesia Type:General  Level of Consciousness: awake, alert , and oriented  Airway & Oxygen Therapy: Patient Spontanous Breathing and Patient connected to nasal cannula oxygen  Post-op Assessment: Report given to RN and Post -op Vital signs reviewed and stable  Post vital signs: Reviewed and stable  Last Vitals:  Vitals Value Taken Time  BP 147/63 06/13/23 0910  Temp 36.8 C 06/13/23 0910  Pulse 83 06/13/23 0911  Resp 15 06/13/23 0911  SpO2 95 % 06/13/23 0911  Vitals shown include unfiled device data.  Last Pain:  Vitals:   06/13/23 0910  TempSrc: Temporal  PainSc: 0-No pain         Complications: There were no known notable events for this encounter.

## 2023-06-13 NOTE — Discharge Instructions (Signed)

## 2023-06-13 NOTE — Progress Notes (Addendum)
 2x1cm purple bruise/irritation from ET tube on R cheek.  1x1 cm on L cheek.  Multiple petechiae noted at carto patch sites, grounding pads and EKG electrodes.  Denies pain or itching at sites.  Allergy profile updated.

## 2023-06-13 NOTE — H&P (Signed)
  Electrophysiology Office Note:   Date:  06/13/2023  ID:  Susan Bell, DOB 03/14/1941, MRN [id number]  Primary Cardiologist: Rollene Rotunda, MD Primary Heart Failure: None Electrophysiologist: Dwan Hemmelgarn Jorja Loa, MD      History of Present Illness:   Susan Bell is a 83 y.o. female with h/o hyperlipidemia, hypothyroidism, atrial fibrillation seen today for routine electrophysiology followup.   Today, denies symptoms of palpitations, chest pain, shortness of breath, orthopnea, PND, lower extremity edema, claudication, dizziness, presyncope, syncope, bleeding, or neurologic sequela. The patient is tolerating medications without difficulties. Plan ablation today.   EP Information / Studies Reviewed:    EKG is ordered today. Personal review as below.        Risk Assessment/Calculations:    CHA2DS2-VASc Score = 5   This indicates a 7.2% annual risk of stroke. The patient's score is based upon: CHF History: 0 HTN History: 1 Diabetes History: 0 Stroke History: 0 Vascular Disease History: 1 Age Score: 2 Gender Score: 1            Physical Exam:   VS:  BP (!) 155/67   Pulse 68   Temp 98.3 F (36.8 C) (Oral)   Resp 16   Ht 5\' 2"  (1.575 m)   Wt 68 kg   SpO2 99%   BMI 27.44 kg/m    Wt Readings from Last 3 Encounters:  06/13/23 68 kg  06/12/23 68.5 kg  05/12/23 67.7 kg    GEN: No acute distress.   Neck: No JVD Cardiac: RRR, no murmurs, rubs, or gallops.  Respiratory: decreased BS bases bilaterally. GI: Soft, nontender, non-distended  MS: No edema; No deformity. Neuro:  Nonfocal  Skin: warm and dry    ASSESSMENT AND PLAN:    1.  Persistent atrial fibrillation: Susan Bell has presented today for surgery, with the diagnosis of AF.  The various methods of treatment have been discussed with the patient and family. After consideration of risks, benefits and other options for treatment, the patient has consented to  Procedure(s): Catheter ablation as a  surgical intervention .  Risks include but not limited to complete heart block, stroke, esophageal damage, nerve damage, bleeding, vascular damage, tamponade, perforation, MI, and death. The patient's history has been reviewed, patient examined, no change in status, stable for surgery.  I have reviewed the patient's chart and labs.  Questions were answered to the patient's satisfaction.    Susan Min Elberta Fortis, MD 06/13/2023 7:12 AM

## 2023-06-13 NOTE — Anesthesia Procedure Notes (Signed)
 Procedure Name: Intubation Date/Time: 06/13/2023 7:51 AM  Performed by: Allyn Kenner, CRNAPre-anesthesia Checklist: Patient identified, Emergency Drugs available, Suction available and Patient being monitored Patient Re-evaluated:Patient Re-evaluated prior to induction Oxygen Delivery Method: Circle System Utilized Preoxygenation: Pre-oxygenation with 100% oxygen Induction Type: IV induction Ventilation: Mask ventilation without difficulty Laryngoscope Size: Mac and 3 Grade View: Grade I Tube type: Oral Tube size: 7.0 mm Number of attempts: 1 Airway Equipment and Method: Stylet and Oral airway Placement Confirmation: ETT inserted through vocal cords under direct vision, positive ETCO2 and breath sounds checked- equal and bilateral Secured at: 20 cm Tube secured with: Tape Dental Injury: Teeth and Oropharynx as per pre-operative assessment

## 2023-06-13 NOTE — Progress Notes (Signed)
 Patient and daughter was given discharge instructions. Both verbalized understanding.

## 2023-06-14 ENCOUNTER — Encounter (HOSPITAL_COMMUNITY): Payer: Self-pay | Admitting: Cardiology

## 2023-06-14 ENCOUNTER — Telehealth (HOSPITAL_COMMUNITY): Payer: Self-pay

## 2023-06-14 MED FILL — Fentanyl Citrate Preservative Free (PF) Inj 100 MCG/2ML: INTRAMUSCULAR | Qty: 2 | Status: AC

## 2023-06-14 NOTE — Telephone Encounter (Signed)
 Spoke with patient to complete post procedure follow up call.  Patient reports no complications with groin sites.   Instructions reviewed with patient:  Remove large bandage at puncture site after 24 hours. It is normal to have bruising, tenderness and a pea or marble sized lump/knot at the groin site which can take up to three months to resolve.  Get help right away if you notice sudden swelling at the puncture site.  Check your puncture site every day for signs of infection: fever, redness, swelling, pus drainage, warmth, foul odor or excessive pain. If this occurs, please call the office at [phone], to speak with the nurse. Get help right away if your puncture site is bleeding and the bleeding does not stop after applying firm pressure to the area.  You may continue to have skipped beats/ atrial fibrillation during the first several months after your procedure.  It is very important not to miss any doses of your blood thinner Eliquis. Patient restarted taking this medication on yesterday, 06/13/23.   You will follow up with the Afib clinic on 07/11/23 and follow up with the APP on 09/13/23.   Patient verbalized understanding to all instructions provided.

## 2023-06-29 DIAGNOSIS — I429 Cardiomyopathy, unspecified: Secondary | ICD-10-CM | POA: Diagnosis not present

## 2023-06-29 DIAGNOSIS — I10 Essential (primary) hypertension: Secondary | ICD-10-CM | POA: Diagnosis not present

## 2023-06-29 DIAGNOSIS — I2510 Atherosclerotic heart disease of native coronary artery without angina pectoris: Secondary | ICD-10-CM | POA: Diagnosis not present

## 2023-06-29 DIAGNOSIS — I4819 Other persistent atrial fibrillation: Secondary | ICD-10-CM | POA: Diagnosis not present

## 2023-06-30 LAB — BASIC METABOLIC PANEL WITH GFR
BUN/Creatinine Ratio: 31 — ABNORMAL HIGH (ref 12–28)
BUN: 27 mg/dL (ref 8–27)
CO2: 24 mmol/L (ref 20–29)
Calcium: 9.3 mg/dL (ref 8.7–10.3)
Chloride: 97 mmol/L (ref 96–106)
Creatinine, Ser: 0.87 mg/dL (ref 0.57–1.00)
Glucose: 80 mg/dL (ref 70–99)
Potassium: 4.3 mmol/L (ref 3.5–5.2)
Sodium: 134 mmol/L (ref 134–144)
eGFR: 66 mL/min/{1.73_m2} (ref >59)

## 2023-07-04 DIAGNOSIS — H40013 Open angle with borderline findings, low risk, bilateral: Secondary | ICD-10-CM | POA: Diagnosis not present

## 2023-07-10 DIAGNOSIS — I110 Hypertensive heart disease with heart failure: Secondary | ICD-10-CM | POA: Diagnosis not present

## 2023-07-10 DIAGNOSIS — E785 Hyperlipidemia, unspecified: Secondary | ICD-10-CM | POA: Diagnosis not present

## 2023-07-10 DIAGNOSIS — D649 Anemia, unspecified: Secondary | ICD-10-CM | POA: Diagnosis not present

## 2023-07-10 DIAGNOSIS — E559 Vitamin D deficiency, unspecified: Secondary | ICD-10-CM | POA: Diagnosis not present

## 2023-07-10 DIAGNOSIS — I5022 Chronic systolic (congestive) heart failure: Secondary | ICD-10-CM | POA: Diagnosis not present

## 2023-07-10 DIAGNOSIS — E039 Hypothyroidism, unspecified: Secondary | ICD-10-CM | POA: Diagnosis not present

## 2023-07-11 ENCOUNTER — Encounter (HOSPITAL_COMMUNITY): Payer: Self-pay | Admitting: Physician Assistant

## 2023-07-11 ENCOUNTER — Ambulatory Visit (HOSPITAL_COMMUNITY)
Admission: RE | Admit: 2023-07-11 | Discharge: 2023-07-11 | Disposition: A | Discharge status: Home / Self Care | Source: Ambulatory Visit | Attending: Physician Assistant | Admitting: Physician Assistant

## 2023-07-11 VITALS — BP 120/58 | HR 58 | Ht 62.0 in

## 2023-07-11 DIAGNOSIS — I2510 Atherosclerotic heart disease of native coronary artery without angina pectoris: Secondary | ICD-10-CM | POA: Insufficient documentation

## 2023-07-11 DIAGNOSIS — E785 Hyperlipidemia, unspecified: Secondary | ICD-10-CM | POA: Insufficient documentation

## 2023-07-11 DIAGNOSIS — E039 Hypothyroidism, unspecified: Secondary | ICD-10-CM | POA: Insufficient documentation

## 2023-07-11 DIAGNOSIS — G4733 Obstructive sleep apnea (adult) (pediatric): Secondary | ICD-10-CM | POA: Diagnosis not present

## 2023-07-11 DIAGNOSIS — I4819 Other persistent atrial fibrillation: Secondary | ICD-10-CM | POA: Diagnosis not present

## 2023-07-11 DIAGNOSIS — Z79899 Other long term (current) drug therapy: Secondary | ICD-10-CM | POA: Insufficient documentation

## 2023-07-11 DIAGNOSIS — I10 Essential (primary) hypertension: Secondary | ICD-10-CM | POA: Insufficient documentation

## 2023-07-11 DIAGNOSIS — D6869 Other thrombophilia: Secondary | ICD-10-CM | POA: Insufficient documentation

## 2023-07-11 DIAGNOSIS — I4891 Unspecified atrial fibrillation: Secondary | ICD-10-CM | POA: Diagnosis present

## 2023-07-11 NOTE — Progress Notes (Signed)
 Primary Care Physician: Creola Corn, MD Referring Physician: [facility] ER f/u Cardiologist-  Dr.  Antoine Poche  EP: Dr. Mariel Kansky is a 83 y.o. female with a h/o HLD, hypothyroidism  that was successfully cardioverted in the [facility] ER 10/26/17 for new onset Afib. She returned to  the afib office 8/2 but had gone back into  afib and was unaware. She was started on BB for better rate control.   She denied tobacco, alcohol, excessive caffeine. Had been told she snored and appeared to stop breathing at night. Sleep study done 8/30 showed OSA. CPAP started. She had an echo that showed EF at 35% with diffuse hypokinesis. Lexi myoview did not show any ischemia but was an intermediate test.  She was already on an ARB/BB.   She remained in afib, rate controlled. She denied any exertional chest pain, is fatigued and has noted shortness of breath more so with steps. She was admitted for Tikosyn.  However after discharge qt prolonged and Tikosyn was stopped.   Since then she has had a long period of time without any afib until this past Monday. No  specific trigger but afib has been persistent since then. She feels some nausea and fatigue while in afib. Afib in the 2 's today on EKG. Echo in 2020 showed normalization of EF.   F/u afib clinic 11/14/19, following successful cardioversion. ekg today shows  Sinus brady . She feels well, improved in SR. She has had her covid shots a while back.   F/u in afib clinic, 01/02/20. She called to the office last week c/o left leg swelling since August. Rt leg no swelling. Reduced her amlodipine for a few days with mild improvement but still swelling. I scheduled her for an Venous U/S this am which was negative for DVT. Minimal swelling this am. Usually  gets more swelling  during the day and down by the next am. She does wear intermittent support socks. Tries to minimize salt. No pain  associated with the swelling. 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 The U/S   technician mentioned to her that she could see evidence of  incompetent veins.   BP elevated on presentation this am, usually well controlled. On recheck improved to 150/80. She will check again when she gets home.  F/u in afib clinic, 12/15,  for return of afib. Would like  to be cardioverted. No missed anticoagulation. She is in afib in the 90's.  F/u afib clinic, 03/31/20, failed cardioversion, despite multiple shocks. We discussed antiarrythmic's. She has failed tikoyn in the past. She has h/o HF and IVCD so flecainide/Multaq is not an option.   So dicussed amiodarone with pt as it appears to be her only option. I discussed this could be short term bridge  to ablation in the near future as she appears to be a good candidate. But she runs fast in afib so feel she will be better served to get her back in rhythm short term with amiodarone. She is in agreement.   F/u in afib clinic, 04/30/20. She has now been loading on amiodarone x one month so will proceed with cardioversion to see if SR can be restored. Pt is in agreement. No missed doses of anticoagulation.   F/u in afib clinic, 05/18/20. She did have successful cardioversion 05/11/20 and ekg today shows SR.   F/u in afib clinic, 07/22/20. She has been staying in SR with amiodarone but reports that her PCP  is concerned as her TSH is staying elevated. We discussed reducing amiodarone  to 100 mg daily and referring for an ablation to see if amiodarone can be d/c after that.   She is now in the afib clinic, 10/14/20 for f/u ablation over concern for her thyroid status and wanting to stop amiodarone. She is in Sinus brady  today. She reports no swallowing or groin issues. Her BP was elevated in the hospital post procedure and is elevated today on arrival and f/u.   Follow up in the AF clinic 09/01/22. Patient reports that she has been in afib since 5/20 with elevated rates. She was started on PRN diltiazem and increased her daily metoprolol which had  converted her in the past. She remains in rapid afib today. There were no specific triggers that she could identify.   Follow up in the AF clinic 09/19/22. Patient is s/p DCCV on 09/06/22. Patient reports that her energy is slowly returning post DCCV. She remains in SR today. No bleeding issues on anticoagulation.   Follow up in the AF clinic 11/02/22. Patient reports that she went back into afib on 7/22. She increased her BB to BID and has been using PRN diltiazem. There were no specific triggers that she could identify.   Follow up 07/11/23. Patient returns for follow up for atrial fibrillation. Patient is s/p afib and flutter ablation with Dr Elberta Fortis on 06/13/23. Patient denies any interim symptoms of afib. She denies chest pain or groin issues. She remains in SR and has more energy.   Today, she  denies symptoms of palpitations, chest pain, shortness of breath, orthopnea, PND, lower extremity edema, dizziness, presyncope, syncope, bleeding, or neurologic sequela. The patient is tolerating medications without difficulties and is otherwise without complaint today.    Past Medical History:  Diagnosis Date   Allergy    seasonal   Anemia    Arthritis    Cataract    bilateral removed   Celiac disease    Eczema    H/O transfusion of platelets    Hyperlipidemia    Hypothyroidism    OSA (obstructive sleep apnea)    Osteopenia    Paroxysmal atrial fibrillation (HCC)    Shingles    Vitamin D deficiency     ROS- All systems are reviewed and negative except as per the HPI above  Physical Exam: Vitals:   07/11/23 1324  BP: (!) 120/58  Pulse: (!) 58  Height: 5\' 2"  (1.575 m)     Wt Readings from Last 3 Encounters:  06/13/23 68 kg  06/12/23 68.5 kg  05/12/23 67.7 kg    GEN: Well nourished, well developed in no acute distress CARDIAC: Regular rate and rhythm, no murmurs, rubs, gallops RESPIRATORY:  Clear to auscultation without rales, wheezing or rhonchi  ABDOMEN: Soft, non-tender,  non-distended EXTREMITIES:  No edema; No deformity    EKG today demonstrates SB, 1st degree AV block, IVCD Vent. rate 58 BPM PR interval 208 ms QRS duration 140 ms QT/QTcB 440/431 ms   Echo- 10/24/18  1. The left ventricle has normal systolic function with an ejection fraction of 60-65%. The cavity size was normal. There is mild concentric  left ventricular hypertrophy. Left ventricular diastolic Doppler  parameters are consistent with pseudonormalization. Elevated mean left atrial pressure.   2. The right ventricle has normal systolic function. The cavity was  normal. There is no increase in right ventricular wall thickness. Right  ventricular systolic pressure is normal with an estimated pressure of  36.9  mmHg.   3. Left atrial size was mildly dilated.   4. There is mild to moderate mitral annular calcification present.   5. The aorta is normal in size and structure.     CHA2DS2-VASc Score = 5  The patient's score is based upon: CHF History: 0 HTN History: 1 Diabetes History: 0 Stroke History: 0 Vascular Disease History: 1 Age Score: 2 Gender Score: 1       ASSESSMENT AND PLAN: Persistent Atrial Fibrillation (ICD10:  I48.19) The patient's CHA2DS2-VASc score is 5, indicating a 7.2% annual risk of stroke.   S/p afib ablation 09/16/20, afib and flutter ablation 06/13/23 Previously failed dofetilide with QT prolongation. Amiodarone discontinued due to thyroid dysfunction.  Patient appears to be maintaining SR. Continue Eliquis 5 mg BID with no missed doses for 3 months post ablation. Continue diltiazem 30 mg PRN q 4 hours for heart racing Continue Toprol 25 mg daily  Secondary Hypercoagulable State (ICD10:  D68.69) The patient is at significant risk for stroke/thromboembolism based upon her CHA2DS2-VASc Score of 5.  Continue Apixaban (Eliquis). No bleeding issues.   HTN Stable on current regimen  OSA  Encouraged nightly CPAP  CAD CAC score 895 on CT No anginal  symptoms Followed by Dr Antoine Poche     Follow up with Canary Brim as scheduled.     Jorja Loa PA-C Afib Clinic [facility] [address] [place], [place] [address] [phone]

## 2023-07-25 DIAGNOSIS — Z1331 Encounter for screening for depression: Secondary | ICD-10-CM | POA: Diagnosis not present

## 2023-07-25 DIAGNOSIS — R82998 Other abnormal findings in urine: Secondary | ICD-10-CM | POA: Diagnosis not present

## 2023-07-25 DIAGNOSIS — Z1389 Encounter for screening for other disorder: Secondary | ICD-10-CM | POA: Diagnosis not present

## 2023-08-08 ENCOUNTER — Other Ambulatory Visit (HOSPITAL_COMMUNITY): Payer: Self-pay | Admitting: *Deleted

## 2023-08-08 DIAGNOSIS — I4819 Other persistent atrial fibrillation: Secondary | ICD-10-CM

## 2023-08-08 DIAGNOSIS — H1132 Conjunctival hemorrhage, left eye: Secondary | ICD-10-CM | POA: Diagnosis not present

## 2023-08-08 DIAGNOSIS — H6121 Impacted cerumen, right ear: Secondary | ICD-10-CM | POA: Diagnosis not present

## 2023-08-08 DIAGNOSIS — H9191 Unspecified hearing loss, right ear: Secondary | ICD-10-CM | POA: Diagnosis not present

## 2023-08-08 MED ORDER — APIXABAN 5 MG PO TABS: 5.0000 mg | ORAL_TABLET | Freq: Two times a day (BID) | ORAL | 2 refills | Status: DC

## 2023-08-14 ENCOUNTER — Ambulatory Visit: Attending: Emergency Medicine | Admitting: Emergency Medicine

## 2023-08-14 ENCOUNTER — Encounter: Payer: Self-pay | Admitting: Emergency Medicine

## 2023-08-14 VITALS — BP 134/56 | HR 61 | Ht 62.0 in | Wt 152.0 lb

## 2023-08-14 DIAGNOSIS — E785 Hyperlipidemia, unspecified: Secondary | ICD-10-CM | POA: Diagnosis not present

## 2023-08-14 DIAGNOSIS — I4819 Other persistent atrial fibrillation: Secondary | ICD-10-CM

## 2023-08-14 DIAGNOSIS — G4733 Obstructive sleep apnea (adult) (pediatric): Secondary | ICD-10-CM | POA: Diagnosis not present

## 2023-08-14 DIAGNOSIS — I10 Essential (primary) hypertension: Secondary | ICD-10-CM | POA: Diagnosis not present

## 2023-08-14 DIAGNOSIS — I2510 Atherosclerotic heart disease of native coronary artery without angina pectoris: Secondary | ICD-10-CM

## 2023-08-14 NOTE — Patient Instructions (Signed)
 Medication Instructions:  NO CHANGES  Lab Work: BMET TO BE DONE TODAY  Testing/Procedures: NONE  Follow-Up: At [facility], you and your health needs are our priority.  As part of our continuing mission to provide you with exceptional heart care, our providers are all part of one team.  This team includes your primary Cardiologist (physician) and Advanced Practice Providers or APPs (Physician Assistants and Nurse Practitioners) who all work together to provide you with the care you need, when you need it.  Your next appointment:   6 MONTHS  Provider:   Eilleen Grates, MD OR Palmer Bobo, [place]

## 2023-08-14 NOTE — Progress Notes (Unsigned)
 Cardiology Office Note:    Date:  08/14/2023  ID:  [name], DOB 18-Feb-1941, MRN [id number] PCP: Margarete Sharps, MD  [facility] Providers Cardiologist:  Eilleen Grates, MD Electrophysiologist:  Will Cortland Ding, MD  Sleep Medicine:  Gaylyn Keas, MD { Click to update primary MD,subspecialty MD or APP then REFRESH:1}    {Click to Open Review  :1}   Patient Profile:      Chief Complaint: 45-month follow-up for hypertension History of Present Illness:  Susan Bell is a 83 y.o. female with visit-pertinent history of  hyperlipidemia, hypothyroidism, atrial fibrillation   She has history of atrial fibrillation all successfully cardioverted in the [facility] ER on 10/26/2017 for new onset A-fib.  She returned to A-fib clinic on 11/03/2017 back in A-fib and was unaware of arrhythmia.  Sleep study done 12/01/2017 showing OSA, CPAP was started.  She had an echocardiogram that showed EF 35% with diffuse hypokinesis.  Lexiscan  Myoview  did not show any ischemia but was an indeterminate test.  Echo in July 2020 showed normalization of EF.  She is s/p ablation for persistent atrial fibrillation on June 2022.  She failed dofetilide  due to QT prolongation, amiodarone  discontinued due to thyroid  dysfunction. She had a recent cardioversion 11/02/2022 due to recurrent atrial fibrillation.     She was last seen in office on 05/12/2023.  She is without any acute cardiovascular concerns or complaints.  Her blood pressure was elevated at 156/64.  Her losartan  was increased from 50 mg to 100 mg daily.  She was return in 4 weeks for blood pressure check.  She was seen in clinic on 06/12/2023.  She was without any acute cardiovascular concerns.  Her blood pressure was elevated at 156/58 and repeat 154/62.  She was started on hydrochlorothiazide  25 mg daily.  Patient underwent A-fib/Aflutter ablation with Dr. Lawana Pray on 06/13/2023.  She had follow-up in office on 07/11/2023 with atrial fibrillation clinic.  She  denied any interim symptoms of A-fib.  She remained in sinus rhythm under and has more energy.   Discussed the use of AI scribe software for clinical note transcription with the patient, who gave verbal consent to proceed.  Today patient states clinic with her daughter.  Patient continues to live with her daughter who helps her out with her medications, appointments, and needs.  Patient notes she is doing well overall without acute cardiovascular concerns or complaints.  She notes her energy level is vastly increased  after her ablation.  Patient notes she has been taking her blood pressure somewhat inconsistently at home but her numbers seem to average in the 130s.  Patient notes that she enjoys crocheting and knitting.  She is not very active at this time however hopes to walk with her daughter every day at lunchtime.  She denies chest pain, shortness of breath, lower extremity edema, fatigue, palpitations, melena, hematuria, hemoptysis, diaphoresis, weakness, presyncope, syncope, orthopnea, and PND.  Review of systems:  Please see the history of present illness. All other systems are reviewed and otherwise negative.     Home Medications:    Current Meds  Medication Sig   acetaminophen  (TYLENOL ) 650 MG CR tablet Take 1,300 mg by mouth at bedtime.   alendronate (FOSAMAX) 70 MG tablet Take 70 mg by mouth every 14 (fourteen) days.   amLODipine  (NORVASC ) 10 MG tablet Take 1 tablet (10 mg total) by mouth daily.   apixaban  (ELIQUIS ) 5 MG TABS tablet Take 1 tablet (5 mg total) by mouth 2 (  two) times daily.   brimonidine  (ALPHAGAN ) 0.15 % ophthalmic solution Place 1 drop into the left eye in the morning and at bedtime.   Calcium  Carb-Cholecalciferol  (CALCIUM  600 + D PO) Take 1 tablet by mouth daily.   Cholecalciferol  (VITAMIN D3) 10 MCG (400 UNIT) CAPS Take 400 Units by mouth daily with breakfast.   Cranberry-Vitamin C-Probiotic (AZO CRANBERRY PO) Take 2 tablets by mouth daily.   diltiazem   (CARDIZEM ) 30 MG tablet Take 1 tablet every 4 hours AS NEEDED for AFIB heart rate >100 as long as top BP >100.   dorzolamide -timolol  (COSOPT ) 22.3-6.8 MG/ML ophthalmic solution Place 1 drop into the left eye 2 (two) times daily.   fluorometholone  (FML) 0.1 % ophthalmic suspension Place 1 drop into the left eye daily.   hydrochlorothiazide  (MICROZIDE ) 12.5 MG capsule Take 1 capsule (12.5 mg total) by mouth daily.   levothyroxine  (SYNTHROID ) 88 MCG tablet Take 88 mcg by mouth daily before breakfast.   loratadine  (CLARITIN ) 10 MG tablet Take 10 mg by mouth at bedtime.   losartan  (COZAAR ) 100 MG tablet Take 1 tablet (100 mg total) by mouth daily.   meclizine  (ANTIVERT ) 25 MG tablet Take 1 tablet (25 mg total) by mouth 3 (three) times daily as needed for dizziness.   Menthol-Methyl Salicylate (SALONPAS PAIN RELIEF PATCH EX) Apply 0.5 patches topically daily as needed (for pain).   metoprolol  succinate (TOPROL -XL) 50 MG 24 hr tablet Take 0.5 tablets (25 mg total) by mouth daily. May take extra 1/2 tablet daily for breakthrough afib   Multiple Vitamins-Minerals (ONE-A-DAY WOMENS 50+ ADVANTAGE) TABS Take 1 tablet by mouth daily with breakfast.   mupirocin  ointment (BACTROBAN ) 2 % Apply 1 Application topically 2 (two) times daily. (Patient taking differently: Apply 1 Application topically 2 (two) times daily as needed (wound care).)   PATADAY 0.2 % SOLN Place 1 drop into both eyes daily as needed (allergies).    PRILOSEC OTC 20 MG tablet Take 20 mg by mouth at bedtime.   rosuvastatin  (CRESTOR ) 10 MG tablet Take 10 mg by mouth at bedtime.   SYSTANE ULTRA 0.4-0.3 % SOLN Place 1 drop into both eyes daily as needed (dry eyes).    traMADol  (ULTRAM ) 50 MG tablet Take 25 mg by mouth daily as needed for moderate pain.   urea  (URE-NA) 15 g PACK oral packet Take 15 g by mouth 2 (two) times daily. (Patient taking differently: Take 15 g by mouth every Monday, Wednesday, and Friday.)   venlafaxine (EFFEXOR) 37.5 MG  tablet Take 0.5 tablets by mouth daily.   vitamin C (ASCORBIC ACID ) 250 MG tablet Take 500 mg by mouth daily.   Studies Reviewed:   EKG Interpretation Date/Time:  Monday Aug 14 2023 15:17:53 EDT Ventricular Rate:  61 PR Interval:  208 QRS Duration:  146 QT Interval:  458 QTC Calculation: 461 R Axis:   -21  Text Interpretation: Normal sinus rhythm Left ventricular hypertrophy with QRS widening and repolarization abnormality ( Cornell product ) When compared with ECG of 11-Jul-2023 13:32, No significant change was found Confirmed by Palmer Bobo [phone]) on 08/14/2023 3:29:53 PM    Echocardiogram 10/24/2018 1. The left ventricle has normal systolic function with an ejection  fraction of 60-65%. The cavity size was normal. There is mild concentric  left ventricular hypertrophy. Left ventricular diastolic Doppler  parameters are consistent with  pseudonormalization. Elevated mean left atrial pressure.   2. The right ventricle has normal systolic function. The cavity was  normal. There is no increase in  right ventricular wall thickness. Right  ventricular systolic pressure is normal with an estimated pressure of 36.9  mmHg.   3. Left atrial size was mildly dilated.   4. There is mild to moderate mitral annular calcification present.   5. The aorta is normal in size and structure.    Lexiscan  Myoview  11/27/2017 Nuclear stress EF: 32%. The study is normal. This is an intermediate risk study. The left ventricular ejection fraction is moderately decreased (30-44% Risk Assessment/Calculations:    CHA2DS2-VASc Score = 5  {Confirm score is correct.  If not, click here to update score.  REFRESH note.  :1} This indicates a 7.2% annual risk of stroke. The patient's score is based upon: CHF History: 0 HTN History: 1 Diabetes History: 0 Stroke History: 0 Vascular Disease History: 1 Age Score: 2 Gender Score: 1   {This patient has a significant risk of stroke if diagnosed with atrial  fibrillation.  Please consider VKA or DOAC agent for anticoagulation if the bleeding risk is acceptable.   You can also use the SmartPhrase .[email or web address] for documentation.   :[id number]}         Physical Exam:   VS:  BP (!) 134/56 (BP Location: Right Arm, Patient Position: Sitting, Cuff Size: Normal)   Pulse 61   Ht 5\' 2"  (1.575 m)   Wt 152 lb (68.9 kg)   SpO2 97%   BMI 27.80 kg/m    Wt Readings from Last 3 Encounters:  08/14/23 152 lb (68.9 kg)  06/13/23 150 lb (68 kg)  06/12/23 151 lb (68.5 kg)    GEN: Well nourished, well developed in no acute distress NECK: No JVD; No carotid bruits CARDIAC: RRR, no murmurs, rubs, gallops RESPIRATORY:  Clear to auscultation without rales, wheezing or rhonchi  ABDOMEN: Soft, non-tender, non-distended EXTREMITIES:  No edema; No acute deformity     Assessment and Plan:  Persistent atrial fibrillation S/p A-fib ablation on 09/16/2020 S/p A-fib and flutter ablation 06/13/2023 Previously failed dofetilide  with QT prolongation. Amiodarone  discontinued due to thyroid  dysfunction.  - EKG today shows patient is maintaining sinus rhythm - Continue diltiazem  30 mg every 4 hours as needed - Continue metoprolol  XL 25 mg daily - Continue Eliquis  5 mg twice daily, no bleeding concerns, adequately dosed  Hypertension Blood pressure today is 140/50 and repeat 134/56 Blood pressure currently under adequate control - Maintain home blood pressure log - Continue amlodipine  10 mg daily, hydrochlorothiazide  12.5 mg daily, losartan  100 mg daily, metoprolol  XL 25 mg daily  Obstructive sleep apnea - Remains adherent to CPAP  Coronary artery disease / Hyperlipidemia Coronary calcium  score 772, (90th percentile) on 06/2017 with LAD and circumflex coronary artery calcification LDL 112, TC 198, HDL 64 on 07/10/2023 - Today she is without any anginal symptoms, no indication for further ischemic evaluation at this time - HLD has been managed by PCP Dr. Mamie Searles  -  Continue rosuvastatin  10 mg daily      Dispo:  Return in about 6 months (around 02/14/2024).  Signed, Ava Boatman, NP

## 2023-08-15 ENCOUNTER — Encounter: Payer: Self-pay | Admitting: Emergency Medicine

## 2023-08-16 ENCOUNTER — Ambulatory Visit: Payer: Self-pay | Admitting: Emergency Medicine

## 2023-08-16 LAB — BASIC METABOLIC PANEL WITH GFR
BUN/Creatinine Ratio: 38 — ABNORMAL HIGH (ref 12–28)
BUN: 29 mg/dL — ABNORMAL HIGH (ref 8–27)
CO2: 20 mmol/L (ref 20–29)
Calcium: 9.9 mg/dL (ref 8.7–10.3)
Chloride: 94 mmol/L — ABNORMAL LOW (ref 96–106)
Creatinine, Ser: 0.77 mg/dL (ref 0.57–1.00)
Glucose: 101 mg/dL — ABNORMAL HIGH (ref 70–99)
Potassium: 4.3 mmol/L (ref 3.5–5.2)
Sodium: 134 mmol/L (ref 134–144)
eGFR: 76 mL/min/{1.73_m2} (ref >59)

## 2023-09-11 ENCOUNTER — Other Ambulatory Visit: Payer: Self-pay

## 2023-09-11 MED ORDER — LOSARTAN POTASSIUM 100 MG PO TABS: 100.0000 mg | ORAL_TABLET | Freq: Every day | ORAL | 3 refills | Status: AC

## 2023-09-12 NOTE — Progress Notes (Unsigned)
 Electrophysiology Office Note:   Date:  09/13/2023  ID:  Susan Bell, DOB 09/20/1940, MRN [id number]  Primary Cardiologist: Eilleen Grates, MD Primary Heart Failure: None Electrophysiologist: Will Cortland Ding, MD      History of Present Illness:   Susan Bell is a 83 y.o. female with h/o AF, HLD, HFrEF, OSA on CPAP, Hypothyroidism seen today for routine electrophysiology followup.   Since last being seen in our clinic the patient reports doing well overall. She is attempting to get back to normal. Her daughter notes that her humor has returned and she seems as though she has more energy. She has noted increased LE swelling in her ankles since starting her amlodipine .     She denies chest pain, palpitations, dyspnea, PND, orthopnea, nausea, vomiting, dizziness, syncope, edema, weight gain, or early satiety.   Review of systems complete and found to be negative unless listed in HPI.   EP Information / Studies Reviewed:    EKG is ordered today. Personal review as below.  EKG Interpretation Date/Time:  Wednesday September 13 2023 14:34:49 EDT Ventricular Rate:  55 PR Interval:  222 QRS Duration:  144 QT Interval:  448 QTC Calculation: 428 R Axis:   -24  Text Interpretation: Sinus bradycardia with 1st degree A-V block Left bundle branch block Confirmed by Creighton Doffing ([id number]) on 09/13/2023 3:11:40 PM   Studies:  ECHO 10/2018 > LVEF 60-65%, LA mildly dilated EPS 09/16/20 > AF on presentation, PF ablation with RF, LA ablation  CT Cardiac Morphology/Pulm > 05/22/23 >  EPS 06/13/23 > SR on presentation, PV ablation with PF energy, posterior wall isolation with PF, CTI ablation   Arrhythmia / AAD AF > initial dx 10/2017  Tikosyn  > failed with prolonged QT  Amiodarone  > stopped due to thyroid  dysfunction Not a candidate for flecainide / Multaq due to HF/IVCD DCCV 09/2022, 11/2022   Risk Assessment/Calculations:    CHA2DS2-VASc Score = 6   This indicates a 9.7% annual risk  of stroke. The patient's score is based upon: CHF History: 1 HTN History: 1 Diabetes History: 0 Stroke History: 0 Vascular Disease History: 1 Age Score: 2 Gender Score: 1             Physical Exam:   VS:  BP 137/65   Pulse (!) 55   Ht 5' 2 (1.575 m)   Wt 150 lb 3.2 oz (68.1 kg)   SpO2 99%   BMI 27.47 kg/m    Wt Readings from Last 3 Encounters:  09/13/23 150 lb 3.2 oz (68.1 kg)  08/14/23 152 lb (68.9 kg)  06/13/23 150 lb (68 kg)     GEN: Well nourished, well developed in no acute distress NECK: No JVD; No carotid bruits CARDIAC: Regular rate and rhythm, no murmurs, rubs, gallops RESPIRATORY:  Clear to auscultation without rales, wheezing or rhonchi  ABDOMEN: Soft, non-tender, non-distended EXTREMITIES:  No edema; No deformity   ASSESSMENT AND PLAN:    Persistent Atrial Fibrillation  CHA2DS2-VASc 6, s/p AF ablation 09/16/20, ablation PV + CTI 06/13/23  -EKG with NSR   -no AF burden post ablation    -continue OAC for stroke prophylaxis  -Diltiazem  30 mg Q4 PRN racing -Toprol  25mg  daily  -monitors with Kardia mobile  Secondary Hypercoagulable State  -continue Eliquis , dose reviewed and appropriate by wt / cr  Hypertension  -well controlled on current regimen   -patient noting lower extremity swelling with Norvasc , reviewed this is a common side effect of this particular drug.  She would like to return to cardiology to discuss change or consolidation of her therapy.  We discussed considering a different ARB such as valsartan for blood pressure and potentially eliminating Norvasc  but will defer to cardiology  CAD  HLD Elevated CAC score 895 on CT  -no anginal symptoms  -follows with Dr. Danley Dusky    Follow up with Dr. Lawana Pray in 12 months or sooner if new symptoms of AF arise  Signed, Creighton Doffing, NP-C, AGACNP-BC [facility] - Electrophysiology  09/13/2023, 3:29 PM

## 2023-09-13 ENCOUNTER — Ambulatory Visit: Attending: Pulmonary Disease | Admitting: Pulmonary Disease

## 2023-09-13 ENCOUNTER — Encounter: Payer: Self-pay | Admitting: Pulmonary Disease

## 2023-09-13 VITALS — BP 137/65 | HR 55 | Ht 62.0 in | Wt 150.2 lb

## 2023-09-13 DIAGNOSIS — I10 Essential (primary) hypertension: Secondary | ICD-10-CM | POA: Diagnosis present

## 2023-09-13 DIAGNOSIS — G4733 Obstructive sleep apnea (adult) (pediatric): Secondary | ICD-10-CM | POA: Diagnosis not present

## 2023-09-13 DIAGNOSIS — I4819 Other persistent atrial fibrillation: Secondary | ICD-10-CM | POA: Insufficient documentation

## 2023-09-13 DIAGNOSIS — I2510 Atherosclerotic heart disease of native coronary artery without angina pectoris: Secondary | ICD-10-CM | POA: Diagnosis not present

## 2023-09-13 DIAGNOSIS — D6869 Other thrombophilia: Secondary | ICD-10-CM | POA: Insufficient documentation

## 2023-09-13 NOTE — Patient Instructions (Addendum)
 Medication Instructions:  Your physician recommends that you continue on your current medications as directed. Please refer to the Current Medication list given to you today.  *If you need a refill on your cardiac medications before your next appointment, please call your pharmacy*  Lab Work: None ordered If you have labs (blood work) drawn today and your tests are completely normal, you will receive your results only by: MyChart Message (if you have MyChart) OR A paper copy in the mail If you have any lab test that is abnormal or we need to change your treatment, we will call you to review the results.  Follow-Up: At [facility], you and your health needs are our priority.  As part of our continuing mission to provide you with exceptional heart care, our providers are all part of one team.  This team includes your primary Cardiologist (physician) and Advanced Practice Providers or APPs (Physician Assistants and Nurse Practitioners) who all work together to provide you with the care you need, when you need it.  Your next appointment:   1 year(s)  Provider:   Agatha Horsfall, MD or Creighton Doffing, NP   Schedule next available (within the next few weeks) with Palmer Bobo, NP.

## 2023-09-26 DIAGNOSIS — E871 Hypo-osmolality and hyponatremia: Secondary | ICD-10-CM | POA: Diagnosis not present

## 2023-09-28 ENCOUNTER — Other Ambulatory Visit (HOSPITAL_COMMUNITY): Payer: Self-pay | Admitting: Physician Assistant

## 2023-10-09 DIAGNOSIS — E871 Hypo-osmolality and hyponatremia: Secondary | ICD-10-CM | POA: Diagnosis not present

## 2023-10-31 ENCOUNTER — Encounter: Payer: Self-pay | Admitting: Emergency Medicine

## 2023-10-31 ENCOUNTER — Ambulatory Visit: Attending: Internal Medicine | Admitting: Emergency Medicine

## 2023-10-31 VITALS — BP 118/46 | HR 60 | Ht 62.0 in | Wt 152.0 lb

## 2023-10-31 DIAGNOSIS — E785 Hyperlipidemia, unspecified: Secondary | ICD-10-CM | POA: Insufficient documentation

## 2023-10-31 DIAGNOSIS — G4733 Obstructive sleep apnea (adult) (pediatric): Secondary | ICD-10-CM | POA: Diagnosis not present

## 2023-10-31 DIAGNOSIS — I5032 Chronic diastolic (congestive) heart failure: Secondary | ICD-10-CM | POA: Insufficient documentation

## 2023-10-31 DIAGNOSIS — I10 Essential (primary) hypertension: Secondary | ICD-10-CM | POA: Insufficient documentation

## 2023-10-31 DIAGNOSIS — I4819 Other persistent atrial fibrillation: Secondary | ICD-10-CM | POA: Diagnosis not present

## 2023-10-31 DIAGNOSIS — I2510 Atherosclerotic heart disease of native coronary artery without angina pectoris: Secondary | ICD-10-CM | POA: Insufficient documentation

## 2023-10-31 MED ORDER — AMLODIPINE BESYLATE 5 MG PO TABS: 5.0000 mg | ORAL_TABLET | Freq: Every day | ORAL | 1 refills | Status: DC

## 2023-10-31 NOTE — Patient Instructions (Addendum)
 Medication Instructions:  STOP TAKING HYDROCHLOROTHIAZIDE . DECREASE AMLODIPINE  TO 5 MG DAILY.   Lab Work: NONE TODAY   Testing/Procedures: NONE  Follow-Up: At [facility], you and your health needs are our priority.  As part of our continuing mission to provide you with exceptional heart care, our providers are all part of one team.  This team includes your primary Cardiologist (physician) and Advanced Practice Providers or APPs (Physician Assistants and Nurse Practitioners) who all work together to provide you with the care you need, when you need it.  Your next appointment:   3 MONTHS  Provider:   Lynwood Schilling, MD OR Lum Louis, DNP     Other Instructions TAKE AND LOG YOUR BLOOD PRESSURE AND HEART RATE EACH DAY, THEN SEND VIA MYCHART OR CALL IN ABOUT 3 WEEKS (11-20-23).

## 2023-10-31 NOTE — Progress Notes (Signed)
 Cardiology Office Note:    Date:  10/31/2023  ID:  Susan Bell, DOB 16-Apr-1940, MRN [id number] PCP: Onita Rush, MD  [facility] Providers Cardiologist:  Lynwood Schilling, MD Electrophysiologist:  Will Gladis Norton, MD  Sleep Medicine:  Wilbert Bihari, MD       Patient Profile:       Chief Complaint: Acute visit for hypertension History of Present Illness:  Susan Bell is a 83 y.o. female with visit-pertinent history of hyperlipidemia, hypothyroidism, atrial fibrillation, hypertension, OSA, HFpEF   She has history of atrial fibrillation all successfully cardioverted in the [facility] ER on 10/26/2017 for new onset A-fib.  She returned to A-fib clinic on 11/03/2017 back in A-fib and was unaware of arrhythmia.  Sleep study done 12/01/2017 showing OSA, CPAP was started.  She had an echocardiogram that showed EF 35% with diffuse hypokinesis.  Lexiscan  Myoview  did not show any ischemia but was an indeterminate test.  Echo in July 2020 showed normalization of EF.  She is s/p ablation for persistent atrial fibrillation on June 2022.  She failed dofetilide  due to QT prolongation, amiodarone  discontinued due to thyroid  dysfunction. She had a recent cardioversion 11/02/2022 due to recurrent atrial fibrillation.     She was seen in office on 05/12/2023.  She is without any acute cardiovascular concerns or complaints.  Her blood pressure was elevated at 156/64.  Her losartan  was increased from 50 mg to 100 mg daily.  She was return in 4 weeks for blood pressure check.   She was seen in clinic on 06/12/2023.  She was without any acute cardiovascular concerns.  Her blood pressure was elevated at 156/58 and repeat 154/62.  She was started on hydrochlorothiazide  25 mg daily.   Patient underwent A-fib/Aflutter ablation with Dr. Norton on 06/13/2023.  She had follow-up in office on 07/11/2023 with atrial fibrillation clinic.  She denied any interim symptoms of A-fib.  She remained in sinus rhythm under and has  more energy.  She was seen in clinic on 08/14/2023.  She was doing well at the time and blood pressure was well-controlled.    She was last seen in clinic on 09/13/2023 by PCP.  Her blood pressure was well-controlled and she was doing well at that time.  She did note some lower extremity swelling since starting Norvasc .  She was to follow-up with cardiology for further assessment.   Discussed the use of AI scribe software for clinical note transcription with the patient, who gave verbal consent to proceed.  History of Present Illness Susan Bell is an 83 year old female with hypertension and atrial fibrillation who presents with blood pressure management and ankle swelling. She is accompanied by her daughter.   Today she tells me she is doing well overall.  She denies any symptoms concerning for recurrent atrial fibrillation at this time.  She denies any chest pain, dyspnea, orthopnea, PND, palpitations.  She experiences daily ankle swelling, particularly by the end of the day, which she associates with an increase in her amlodipine  dosage to 10 mg. No swelling occurred on a 5 mg dose.  She is worried about her current sodium levels since starting hydrochlorothiazide .  Noting her last reading with her PCP was 128.  She is now taking urea  packets 3 times a week to manage her sodium levels, which has been unstable in the past and led to a prior hospitalization.  Her current medications include amlodipine , losartan , and metoprolol . Amlodipine  is taken at night, metoprolol  in  the morning, and she monitors her blood pressure in the afternoon.   She continues to use her CPAP without issues  Review of systems:  Please see the history of present illness. All other systems are reviewed and otherwise negative.      Studies Reviewed:        Echocardiogram 10/24/2018 1. The left ventricle has normal systolic function with an ejection  fraction of 60-65%. The cavity size was normal. There is mild  concentric  left ventricular hypertrophy. Left ventricular diastolic Doppler  parameters are consistent with  pseudonormalization. Elevated mean left atrial pressure.   2. The right ventricle has normal systolic function. The cavity was  normal. There is no increase in right ventricular wall thickness. Right  ventricular systolic pressure is normal with an estimated pressure of 36.9  mmHg.   3. Left atrial size was mildly dilated.   4. There is mild to moderate mitral annular calcification present.   5. The aorta is normal in size and structure.    Lexiscan  Myoview  11/27/2017 Nuclear stress EF: 32%. The study is normal. This is an intermediate risk study. The left ventricular ejection fraction is moderately decreased (30-44%)  Risk Assessment/Calculations:    CHA2DS2-VASc Score = 6   This indicates a 9.7% annual risk of stroke. The patient's score is based upon: CHF History: 1 HTN History: 1 Diabetes History: 0 Stroke History: 0 Vascular Disease History: 1 Age Score: 2 Gender Score: 1             Physical Exam:   VS:  BP (!) 118/46 (BP Location: Left Arm, Patient Position: Sitting, Cuff Size: Normal)   Pulse 60   Ht 5' 2 (1.575 m)   Wt 152 lb (68.9 kg)   BMI 27.80 kg/m    Wt Readings from Last 3 Encounters:  10/31/23 152 lb (68.9 kg)  09/13/23 150 lb 3.2 oz (68.1 kg)  08/14/23 152 lb (68.9 kg)    GEN: Well nourished, well developed in no acute distress NECK: No JVD; No carotid bruits CARDIAC: RRR, no murmurs, rubs, gallops RESPIRATORY:  Clear to auscultation without rales, wheezing or rhonchi  ABDOMEN: Soft, non-tender, non-distended EXTREMITIES:  No edema; No acute deformity      Assessment and Plan:  Persistent atrial fibrillation S/p A-fib ablation on 09/16/2020 S/p ablation PV + CTI 06/13/2023 Previously failed dofetilide  with QT prolongation. Amiodarone  discontinued due to thyroid  dysfunction.  - Today she is without symptoms concerning for recurrent  atrial fibrillation.  She has had no A-fib burden post ablation - Continue diltiazem  30 mg every 4 hours as needed - Continue metoprolol  XL 25 mg daily - Continue Eliquis  5 mg twice daily, no bleeding concerns, adequately dosed   Hypertension Blood pressure today is 118/46 and well-controlled - She has noted some increased lower extremity edema since increasing amlodipine  to 10 mg that has become bothersome for her - She also have history of chronic hyponatremia with admission on 05/2021, currently on urea  packets with her recent sodium level of 128 - Plan to decrease amlodipine  to 5 mg daily to address her lower extremity swelling.  Plan to discontinue hydrochlorothiazide  given her chronic hyponatremia - Monitor BP over the next 3 weeks and send average BP over MyChart.  If average BP remains greater than 140/90 we will consider switching to higher intensity ARB - Current antihypertensive regimen: Amlodipine  5 mg daily, Toprol -XL 25 mg daily, and Losartan  100 mg daily - BMET x 2 weeks   Obstructive sleep apnea -  Remains adherent to CPAP   Coronary artery disease Hyperlipidemia, LDL goal <70 Coronary calcium  score 895 (86th percentile) on 05/2023 LDL 112, TC 198, HDL 64 on 07/10/2023 - Today she is without any anginal symptoms, no indication for further ischemic evaluation at this time - HLD has been managed by PCP Dr. Onita and to have repeat labs on 01/2024 - Continue rosuvastatin  10 mg daily  HFpEF Echocardiogram 11/2017 with LVEF 35% Echocardiogram 10/2018 with improved LVEF 60 to 65% - Today patient is euvolemic and well compensated on exam.  No volume overload on exam today - No changes to current therapy.  Continue metoprolol  Excel 25 mg daily and losartan  100 mg daily      Dispo:  Return in about 3 months (around 01/31/2024).  Signed, Lum LITTIE Louis, NP

## 2023-11-17 DIAGNOSIS — I10 Essential (primary) hypertension: Secondary | ICD-10-CM | POA: Diagnosis not present

## 2023-11-18 LAB — BASIC METABOLIC PANEL WITH GFR
BUN/Creatinine Ratio: 24 (ref 12–28)
BUN: 16 mg/dL (ref 8–27)
CO2: 21 mmol/L (ref 20–29)
Calcium: 9.4 mg/dL (ref 8.7–10.3)
Chloride: 95 mmol/L — ABNORMAL LOW (ref 96–106)
Creatinine, Ser: 0.66 mg/dL (ref 0.57–1.00)
Glucose: 121 mg/dL — ABNORMAL HIGH (ref 70–99)
Potassium: 4.3 mmol/L (ref 3.5–5.2)
Sodium: 130 mmol/L — ABNORMAL LOW (ref 134–144)
eGFR: 87 mL/min/1.73 (ref >59)

## 2023-11-20 ENCOUNTER — Ambulatory Visit: Payer: Self-pay | Admitting: Emergency Medicine

## 2023-11-20 NOTE — Progress Notes (Signed)
 Patient called back and I gave her the results to the blood work that was done. She had no questions. She gave me the reading to her blood pressure that she had been recording each day for the past 2 weeks.

## 2023-11-20 NOTE — Telephone Encounter (Signed)
 Patient blood pressure readings are as followed:   DATE:     BLOOD PRESSURE:    PULSE:  11-01-23      129/53      53 11-02-23   123/52      51 11-03-23    129/56      57 11-04-23    143/59      53 11-05-23    151/64      57 11-06-23    152/61      54 11-07-23    125/56      55 11-08-23    152/60      54 11-09-23    113/54      55 11-10-23    133/58      53  11-13-23   154/60      49 11-14-23   145/61      54 11-15-23   150/63      54 11-16-23   148/61      51 11-17-23   152/62      49 11-18-23   148/61      53

## 2023-12-11 DIAGNOSIS — I110 Hypertensive heart disease with heart failure: Secondary | ICD-10-CM | POA: Diagnosis not present

## 2024-01-25 DIAGNOSIS — I480 Paroxysmal atrial fibrillation: Secondary | ICD-10-CM | POA: Diagnosis not present

## 2024-01-25 DIAGNOSIS — I110 Hypertensive heart disease with heart failure: Secondary | ICD-10-CM | POA: Diagnosis not present

## 2024-01-25 DIAGNOSIS — I2510 Atherosclerotic heart disease of native coronary artery without angina pectoris: Secondary | ICD-10-CM | POA: Diagnosis not present

## 2024-01-25 DIAGNOSIS — E785 Hyperlipidemia, unspecified: Secondary | ICD-10-CM | POA: Diagnosis not present

## 2024-01-25 DIAGNOSIS — E871 Hypo-osmolality and hyponatremia: Secondary | ICD-10-CM | POA: Diagnosis not present

## 2024-01-25 DIAGNOSIS — M1990 Unspecified osteoarthritis, unspecified site: Secondary | ICD-10-CM | POA: Diagnosis not present

## 2024-01-25 DIAGNOSIS — I272 Pulmonary hypertension, unspecified: Secondary | ICD-10-CM | POA: Diagnosis not present

## 2024-01-25 DIAGNOSIS — E039 Hypothyroidism, unspecified: Secondary | ICD-10-CM | POA: Diagnosis not present

## 2024-01-25 DIAGNOSIS — I5022 Chronic systolic (congestive) heart failure: Secondary | ICD-10-CM | POA: Diagnosis not present

## 2024-01-25 DIAGNOSIS — R600 Localized edema: Secondary | ICD-10-CM | POA: Diagnosis not present

## 2024-01-25 DIAGNOSIS — Z23 Encounter for immunization: Secondary | ICD-10-CM | POA: Diagnosis not present

## 2024-01-25 DIAGNOSIS — F325 Major depressive disorder, single episode, in full remission: Secondary | ICD-10-CM | POA: Diagnosis not present

## 2024-01-30 NOTE — Progress Notes (Unsigned)
  Cardiology Office Note:   Date:  01/31/2024  ID:  Susan Bell, DOB 1940/11/30, MRN [id number] PCP: Onita Rush, MD  [facility] Providers Cardiologist:  Lynwood Schilling, MD Electrophysiologist:  Will Gladis Norton, MD  Sleep Medicine:  Wilbert Bihari, MD {  History of Present Illness:   Susan Bell is a 83 y.o. female who presents for follow up of atrial fib.  She has had persistent atrial fib requiring cardioversion and was on Tikosyn  because of recurrence after cardioversion. However, she had recurrence of fib on this and long QT and was taken off of this.    She eventually had another attempted cardioversion but failed.  She was started on amiodarone .   However, she still had atrial fib.    She eventually had ablation.  I have not seen her in a couple of years.    He was seen in our office most recently in July 2025.  She was doing well at that time. The patient denies any new symptoms such as chest discomfort, neck or arm discomfort. There has been no new shortness of breath, PND or orthopnea. There have been no reported palpitations, presyncope or syncope.  She climbs many steps to her church.  She does chores at home and lives with her daughter.   At the last visit she had increased lower extremity swelling and had her Norvasc  dose decreased.  Her swelling is improved.   ROS: As stated in the HPI and negative for all other systems.  Studies Reviewed:    EKG:     NA  Risk Assessment/Calculations:    CHA2DS2-VASc Score = 6   This indicates a 9.7% annual risk of stroke. The patient's score is based upon: CHF History: 1 HTN History: 1 Diabetes History: 0 Stroke History: 0 Vascular Disease History: 1 Age Score: 2 Gender Score: 1   Physical Exam:   VS:  BP (!) 154/56   Pulse (!) 58   Ht 5' 2 (1.575 m)   Wt 153 lb (69.4 kg)   BMI 27.98 kg/m    Wt Readings from Last 3 Encounters:  01/31/24 153 lb (69.4 kg)  10/31/23 152 lb (68.9 kg)  09/13/23 150 lb  3.2 oz (68.1 kg)     GEN: Well nourished, well developed in no acute distress NECK: No JVD; No carotid bruits CARDIAC: RRR, no murmurs, rubs, gallops RESPIRATORY:  Clear to auscultation without rales, wheezing or rhonchi  ABDOMEN: Soft, non-tender, non-distended EXTREMITIES:  No edema; No deformity   ASSESSMENT AND PLAN:   ATRIAL FIB:   She is status post ablation in March 2025.  She has had no symptomatic recurrence.  I will continue meds as listed but see her back in March at the 1 year anniversary of her ablation to decide whether she needs continued DOAC.   CHRONIC SYSTOLIC HF:    Her EF was ejection fraction had improved to 60 to 65% and I would not suspect that it is reduced again.  No change in therapy.    OSA:   She tolerates CPAP.  No change ion therpay  HTN: Blood pressure is elevated but it is not at home.  She keeps a BP diary.  I told her the target and would be happy to review future readings.     Follow up with me in March.   Signed, Lynwood Schilling, MD

## 2024-01-31 ENCOUNTER — Encounter: Payer: Self-pay | Admitting: Cardiology

## 2024-01-31 ENCOUNTER — Ambulatory Visit: Attending: Cardiology | Admitting: Cardiology

## 2024-01-31 VITALS — BP 154/56 | HR 58 | Ht 62.0 in | Wt 153.0 lb

## 2024-01-31 DIAGNOSIS — M7989 Other specified soft tissue disorders: Secondary | ICD-10-CM | POA: Insufficient documentation

## 2024-01-31 DIAGNOSIS — I4819 Other persistent atrial fibrillation: Secondary | ICD-10-CM | POA: Insufficient documentation

## 2024-01-31 DIAGNOSIS — I10 Essential (primary) hypertension: Secondary | ICD-10-CM | POA: Diagnosis not present

## 2024-01-31 NOTE — Patient Instructions (Signed)
 Medication Instructions:  Your physician recommends that you continue on your current medications as directed. Please refer to the Current Medication list given to you today.  *If you need a refill on your cardiac medications before your next appointment, please call your pharmacy*  Lab Work: NONE If you have labs (blood work) drawn today and your tests are completely normal, you will receive your results only by: MyChart Message (if you have MyChart) OR A paper copy in the mail If you have any lab test that is abnormal or we need to change your treatment, we will call you to review the results.  Testing/Procedures: NONE  Follow-Up: At [facility], you and your health needs are our priority.  As part of our continuing mission to provide you with exceptional heart care, our providers are all part of one team.  This team includes your primary Cardiologist (physician) and Advanced Practice Providers or APPs (Physician Assistants and Nurse Practitioners) who all work together to provide you with the care you need, when you need it.  Your next appointment:   March 2026  Provider:   [place], MD  We recommend signing up for the patient portal called MyChart.  Sign up information is provided on this After Visit Summary.  MyChart is used to connect with patients for Virtual Visits (Telemedicine).  Patients are able to view lab/test results, encounter notes, upcoming appointments, etc.  Non-urgent messages can be sent to your provider as well.   To learn more about what you can do with MyChart, go to [email or web address].

## 2024-04-05 ENCOUNTER — Other Ambulatory Visit (HOSPITAL_COMMUNITY): Payer: Self-pay | Admitting: *Deleted

## 2024-04-05 MED ORDER — DILTIAZEM HCL 30 MG PO TABS: ORAL_TABLET | ORAL | 1 refills | Status: AC

## 2024-04-08 ENCOUNTER — Ambulatory Visit (HOSPITAL_COMMUNITY)
Admission: RE | Admit: 2024-04-08 | Discharge: 2024-04-08 | Disposition: A | Discharge status: Home / Self Care | Source: Ambulatory Visit | Attending: Physician Assistant | Admitting: Physician Assistant

## 2024-04-08 VITALS — BP 136/64 | HR 80 | Ht 62.0 in | Wt 152.0 lb

## 2024-04-08 DIAGNOSIS — D6869 Other thrombophilia: Secondary | ICD-10-CM | POA: Diagnosis not present

## 2024-04-08 DIAGNOSIS — I480 Paroxysmal atrial fibrillation: Secondary | ICD-10-CM | POA: Diagnosis not present

## 2024-04-08 DIAGNOSIS — I4819 Other persistent atrial fibrillation: Secondary | ICD-10-CM | POA: Diagnosis not present

## 2024-04-08 NOTE — Patient Instructions (Signed)
 Cardioversion scheduled for: Tuesday January 6th   - Arrive at the [facility] ([address])  and check in with ADMITTING at noon   - Do not eat or drink anything after midnight the night prior to your procedure.   - Take all your morning medication (except diabetic medications) with a sip of water prior to arrival.  - Do NOT miss any doses of your blood thinner - if you should miss a dose or take a dose more than 4 hours late -- please notify our office immediately.  - You will not be able to drive home after your procedure. Please ensure you have a responsible adult to drive you home. You will need someone with you for 24 hours post procedure.     - Expect to be in the procedural area approximately 2 hours.   - If you feel as if you go back into normal rhythm prior to scheduled cardioversion, please notify our office immediately.   If your procedure is canceled in the cardioversion suite you will be charged a cancellation fee.

## 2024-04-08 NOTE — H&P (View-Only) (Signed)
 "  Primary Care Physician: Onita Rush, MD Referring Physician: [facility] f/u Cardiologist-  Dr.  Lavona  EP: Dr. Inocencio Gaetana Susan Bell is a 84 y.o. female with a h/o HLD, hypothyroidism  that was successfully cardioverted in the [facility] ER 10/26/17 for new onset Afib. She returned to  the afib office 8/2 but had gone back into  afib and was unaware. She was started on BB for better rate control.   She denied tobacco, alcohol , excessive caffeine. Had been told she snored and appeared to stop breathing at night. Sleep study done 8/30 showed OSA. CPAP started. She had an echo that showed EF at 35% with diffuse hypokinesis. Lexi myoview  did not show any ischemia but was an intermediate test.  She was already on an ARB/BB.   She remained in afib, rate controlled. She denied any exertional chest pain, is fatigued and has noted shortness of breath more so with steps. She was admitted for Tikosyn .  However after discharge qt prolonged and Tikosyn  was stopped.   Since then she has had a long period of time without any afib until this past Monday. No  specific trigger but afib has been persistent since then. She feels some nausea and fatigue while in afib. Afib in the 2 's today on EKG. Echo in 2020 showed normalization of EF.   F/u afib clinic 11/14/19, following successful cardioversion. ekg today shows  Sinus brady . She feels well, improved in SR. She has had her covid shots a while back.   F/u in afib clinic, 01/02/20. She called to the office last week c/o left leg swelling since August. Rt leg no swelling. Reduced her amlodipine  for a few days with mild improvement but still swelling. I scheduled her for an Venous U/S this am which was negative for DVT. Minimal swelling this am. Usually  gets more swelling  during the day and down by the next am. She does wear intermittent support socks. Tries to minimize salt. No pain  associated with the swelling. 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 The U/S   technician mentioned to her that she could see evidence of  incompetent veins.   BP elevated on presentation this am, usually well controlled. On recheck improved to 150/80. She will check again when she gets home.  F/u in afib clinic, 12/15,  for return of afib. Would like  to be cardioverted. No missed anticoagulation. She is in afib in the 90's.  F/u afib clinic, 03/31/20, failed cardioversion, despite multiple shocks. We discussed antiarrythmic's. She has failed tikoyn in the past. She has h/o HF and IVCD so flecainide/Multaq is not an option.   So dicussed amiodarone  with pt as it appears to be her only option. I discussed this could be short term bridge  to ablation in the near future as she appears to be a good candidate. But she runs fast in afib so feel she will be better served to get her back in rhythm short term with amiodarone . She is in agreement.   F/u in afib clinic, 04/30/20. She has now been loading on amiodarone  x one month so will proceed with cardioversion to see if SR can be restored. Pt is in agreement. No missed doses of anticoagulation.   F/u in afib clinic, 05/18/20. She did have successful cardioversion 05/11/20 and ekg today shows SR.   F/u in afib clinic, 07/22/20. She has been staying in SR with amiodarone  but reports that her  PCP is concerned as her TSH is staying elevated. We discussed reducing amiodarone   to 100 mg daily and referring for an ablation to see if amiodarone  can be d/c after that.   She is now in the afib clinic, 10/14/20 for f/u ablation over concern for her thyroid  status and wanting to stop amiodarone . She is in Sinus brady  today. She reports no swallowing or groin issues. Her BP was elevated in the hospital post procedure and is elevated today on arrival and f/u.   Follow up in the AF clinic 09/01/22. Patient reports that she has been in afib since 5/20 with elevated rates. She was started on PRN diltiazem  and increased her daily metoprolol  which had  converted her in the past. She remains in rapid afib today. There were no specific triggers that she could identify.   Follow up in the AF clinic 09/19/22. Patient is s/p DCCV on 09/06/22. Patient reports that her energy is slowly returning post DCCV. She remains in SR today. No bleeding issues on anticoagulation.   Follow up in the AF clinic 11/02/22. Patient reports that she went back into afib on 7/22. She increased her BB to BID and has been using PRN diltiazem . There were no specific triggers that she could identify.   Follow up 07/11/23. Patient returns for follow up for atrial fibrillation. Patient is s/p afib and flutter ablation with Dr Inocencio on 06/13/23. Patient denies any interim symptoms of afib. She denies chest pain or groin issues. She remains in SR and has more energy.   Follow up 04/08/24. Patient returns for follow up for atrial fibrillation. She reports that she went into afib on 04/03/24. There were no specific triggers that she could identify. No bleeding issues on anticoagulation. She has symptoms of fatigue with exertion.   Today, she  denies symptoms of palpitations, chest pain, shortness of breath, orthopnea, PND, lower extremity edema, dizziness, presyncope, syncope, snoring, daytime somnolence, bleeding, or neurologic sequela. The patient is tolerating medications without difficulties and is otherwise without complaint today.    Past Medical History:  Diagnosis Date   Allergy    seasonal   Anemia    Arthritis    Cataract    bilateral removed   Celiac disease    Eczema    H/O transfusion of platelets    Hyperlipidemia    Hypothyroidism    OSA (obstructive sleep apnea)    Osteopenia    Paroxysmal atrial fibrillation (HCC)    Shingles    Vitamin D  deficiency     ROS- All systems are reviewed and negative except as per the HPI above  Physical Exam: Vitals:   04/08/24 1520  Weight: 68.9 kg  Height: 5' 2 (1.575 m)    Wt Readings from Last 3 Encounters:   04/08/24 68.9 kg  01/31/24 69.4 kg  10/31/23 68.9 kg    GEN: Well nourished, well developed in no acute distress CARDIAC: Irregularly irregular rate and rhythm, no murmurs, rubs, gallops RESPIRATORY:  Clear to auscultation without rales, wheezing or rhonchi  ABDOMEN: Soft, non-tender, non-distended EXTREMITIES:  No edema; No deformity    EKG Interpretation Date/Time:  Monday April 08 2024 15:36:49 EST Ventricular Rate:  80 PR Interval:    QRS Duration:  134 QT Interval:  384 QTC Calculation: 442 R Axis:   -39  Text Interpretation: Atrial fibrillation Left axis deviation Left ventricular hypertrophy with QRS widening and repolarization abnormality ( R in aVL , Cornell product ) Abnormal ECG When compared  with ECG of 13-Sep-2023 14:34, Atrial fibrillation has replaced Sinus rhythm Confirmed by Meygan Kyser ([id number]) on 04/08/2024 3:44:59 PM    Echo- 10/24/18  1. The left ventricle has normal systolic function with an ejection fraction of 60-65%. The cavity size was normal. There is mild concentric  left ventricular hypertrophy. Left ventricular diastolic Doppler  parameters are consistent with pseudonormalization. Elevated mean left atrial pressure.   2. The right ventricle has normal systolic function. The cavity was  normal. There is no increase in right ventricular wall thickness. Right  ventricular systolic pressure is normal with an estimated pressure of 36.9  mmHg.   3. Left atrial size was mildly dilated.   4. There is mild to moderate mitral annular calcification present.   5. The aorta is normal in size and structure.     CHA2DS2-VASc Score = 6  The patient's score is based upon: CHF History: 1 HTN History: 1 Diabetes History: 0 Stroke History: 0 Vascular Disease History: 1 Age Score: 2 Gender Score: 1       ASSESSMENT AND PLAN: Persistent Atrial Fibrillation (ICD10:  I48.19) The patient's CHA2DS2-VASc score is 6, indicating a 9.7% annual risk of stroke.   S/p  afib ablation 09/16/20, afib and flutter ablation 06/13/23 She is back in persistent afib, symptomatic.  Previously failed dofetilide  with QT prolongation. Amiodarone  discontinued due to thyroid  dysfunction.  Will plan for DCCV. Check bmet/cbc.  Continue Eliquis  5 mg BID Continue Toprol  50 mg daily for now. Decrease back to 25 mg daily post DCCV.  Continue diltiazem  30 mg PRN q 4 hours for heart racing.   Secondary Hypercoagulable State (ICD10:  D68.69) The patient is at significant risk for stroke/thromboembolism based upon her CHA2DS2-VASc Score of 6.  Continue Apixaban  (Eliquis ). No bleeding issues.   HTN Stable on current regimen  OSA  Encouraged nightly CPAP  CAD No anginal symptoms Followed by Dr Lavona    Follow up in the AF clinic post DCCV.    Informed Consent   Shared Decision Making/Informed Consent The risks (stroke, cardiac arrhythmias rarely resulting in the need for a temporary or permanent pacemaker, skin irritation or burns and complications associated with conscious sedation including aspiration, arrhythmia, respiratory failure and death), benefits (restoration of normal sinus rhythm) and alternatives of a direct current cardioversion were explained in detail to Ms. Utecht and she agrees to proceed.       Daril Kicks PA-C Afib Clinic [facility] [address] [place], [place] [address] [phone] "

## 2024-04-08 NOTE — Progress Notes (Signed)
 "  Primary Care Physician: Onita Rush, MD Referring Physician: [facility] f/u Cardiologist-  Dr.  Lavona  EP: Dr. Inocencio Gaetana Susan Bell is a 84 y.o. female with a h/o HLD, hypothyroidism  that was successfully cardioverted in the [facility] ER 10/26/17 for new onset Afib. She returned to  the afib office 8/2 but had gone back into  afib and was unaware. She was started on BB for better rate control.   She denied tobacco, alcohol , excessive caffeine. Had been told she snored and appeared to stop breathing at night. Sleep study done 8/30 showed OSA. CPAP started. She had an echo that showed EF at 35% with diffuse hypokinesis. Lexi myoview  did not show any ischemia but was an intermediate test.  She was already on an ARB/BB.   She remained in afib, rate controlled. She denied any exertional chest pain, is fatigued and has noted shortness of breath more so with steps. She was admitted for Tikosyn .  However after discharge qt prolonged and Tikosyn  was stopped.   Since then she has had a long period of time without any afib until this past Monday. No  specific trigger but afib has been persistent since then. She feels some nausea and fatigue while in afib. Afib in the 2 's today on EKG. Echo in 2020 showed normalization of EF.   F/u afib clinic 11/14/19, following successful cardioversion. ekg today shows  Sinus brady . She feels well, improved in SR. She has had her covid shots a while back.   F/u in afib clinic, 01/02/20. She called to the office last week c/o left leg swelling since August. Rt leg no swelling. Reduced her amlodipine  for a few days with mild improvement but still swelling. I scheduled her for an Venous U/S this am which was negative for DVT. Minimal swelling this am. Usually  gets more swelling  during the day and down by the next am. She does wear intermittent support socks. Tries to minimize salt. No pain  associated with the swelling. 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 The U/S   technician mentioned to her that she could see evidence of  incompetent veins.   BP elevated on presentation this am, usually well controlled. On recheck improved to 150/80. She will check again when she gets home.  F/u in afib clinic, 12/15,  for return of afib. Would like  to be cardioverted. No missed anticoagulation. She is in afib in the 90's.  F/u afib clinic, 03/31/20, failed cardioversion, despite multiple shocks. We discussed antiarrythmic's. She has failed tikoyn in the past. She has h/o HF and IVCD so flecainide/Multaq is not an option.   So dicussed amiodarone  with pt as it appears to be her only option. I discussed this could be short term bridge  to ablation in the near future as she appears to be a good candidate. But she runs fast in afib so feel she will be better served to get her back in rhythm short term with amiodarone . She is in agreement.   F/u in afib clinic, 04/30/20. She has now been loading on amiodarone  x one month so will proceed with cardioversion to see if SR can be restored. Pt is in agreement. No missed doses of anticoagulation.   F/u in afib clinic, 05/18/20. She did have successful cardioversion 05/11/20 and ekg today shows SR.   F/u in afib clinic, 07/22/20. She has been staying in SR with amiodarone  but reports that her  PCP is concerned as her TSH is staying elevated. We discussed reducing amiodarone   to 100 mg daily and referring for an ablation to see if amiodarone  can be d/c after that.   She is now in the afib clinic, 10/14/20 for f/u ablation over concern for her thyroid  status and wanting to stop amiodarone . She is in Sinus brady  today. She reports no swallowing or groin issues. Her BP was elevated in the hospital post procedure and is elevated today on arrival and f/u.   Follow up in the AF clinic 09/01/22. Patient reports that she has been in afib since 5/20 with elevated rates. She was started on PRN diltiazem  and increased her daily metoprolol  which had  converted her in the past. She remains in rapid afib today. There were no specific triggers that she could identify.   Follow up in the AF clinic 09/19/22. Patient is s/p DCCV on 09/06/22. Patient reports that her energy is slowly returning post DCCV. She remains in SR today. No bleeding issues on anticoagulation.   Follow up in the AF clinic 11/02/22. Patient reports that she went back into afib on 7/22. She increased her BB to BID and has been using PRN diltiazem . There were no specific triggers that she could identify.   Follow up 07/11/23. Patient returns for follow up for atrial fibrillation. Patient is s/p afib and flutter ablation with Dr Inocencio on 06/13/23. Patient denies any interim symptoms of afib. She denies chest pain or groin issues. She remains in SR and has more energy.   Follow up 04/08/24. Patient returns for follow up for atrial fibrillation. She reports that she went into afib on 04/03/24. There were no specific triggers that she could identify. No bleeding issues on anticoagulation. She has symptoms of fatigue with exertion.   Today, she  denies symptoms of palpitations, chest pain, shortness of breath, orthopnea, PND, lower extremity edema, dizziness, presyncope, syncope, snoring, daytime somnolence, bleeding, or neurologic sequela. The patient is tolerating medications without difficulties and is otherwise without complaint today.    Past Medical History:  Diagnosis Date   Allergy    seasonal   Anemia    Arthritis    Cataract    bilateral removed   Celiac disease    Eczema    H/O transfusion of platelets    Hyperlipidemia    Hypothyroidism    OSA (obstructive sleep apnea)    Osteopenia    Paroxysmal atrial fibrillation (HCC)    Shingles    Vitamin D  deficiency     ROS- All systems are reviewed and negative except as per the HPI above  Physical Exam: Vitals:   04/08/24 1520  Weight: 68.9 kg  Height: 5' 2 (1.575 m)    Wt Readings from Last 3 Encounters:   04/08/24 68.9 kg  01/31/24 69.4 kg  10/31/23 68.9 kg    GEN: Well nourished, well developed in no acute distress CARDIAC: Irregularly irregular rate and rhythm, no murmurs, rubs, gallops RESPIRATORY:  Clear to auscultation without rales, wheezing or rhonchi  ABDOMEN: Soft, non-tender, non-distended EXTREMITIES:  No edema; No deformity    EKG Interpretation Date/Time:  Monday April 08 2024 15:36:49 EST Ventricular Rate:  80 PR Interval:    QRS Duration:  134 QT Interval:  384 QTC Calculation: 442 R Axis:   -39  Text Interpretation: Atrial fibrillation Left axis deviation Left ventricular hypertrophy with QRS widening and repolarization abnormality ( R in aVL , Cornell product ) Abnormal ECG When compared  with ECG of 13-Sep-2023 14:34, Atrial fibrillation has replaced Sinus rhythm Confirmed by Meygan Kyser ([id number]) on 04/08/2024 3:44:59 PM    Echo- 10/24/18  1. The left ventricle has normal systolic function with an ejection fraction of 60-65%. The cavity size was normal. There is mild concentric  left ventricular hypertrophy. Left ventricular diastolic Doppler  parameters are consistent with pseudonormalization. Elevated mean left atrial pressure.   2. The right ventricle has normal systolic function. The cavity was  normal. There is no increase in right ventricular wall thickness. Right  ventricular systolic pressure is normal with an estimated pressure of 36.9  mmHg.   3. Left atrial size was mildly dilated.   4. There is mild to moderate mitral annular calcification present.   5. The aorta is normal in size and structure.     CHA2DS2-VASc Score = 6  The patient's score is based upon: CHF History: 1 HTN History: 1 Diabetes History: 0 Stroke History: 0 Vascular Disease History: 1 Age Score: 2 Gender Score: 1       ASSESSMENT AND PLAN: Persistent Atrial Fibrillation (ICD10:  I48.19) The patient's CHA2DS2-VASc score is 6, indicating a 9.7% annual risk of stroke.   S/p  afib ablation 09/16/20, afib and flutter ablation 06/13/23 She is back in persistent afib, symptomatic.  Previously failed dofetilide  with QT prolongation. Amiodarone  discontinued due to thyroid  dysfunction.  Will plan for DCCV. Check bmet/cbc.  Continue Eliquis  5 mg BID Continue Toprol  50 mg daily for now. Decrease back to 25 mg daily post DCCV.  Continue diltiazem  30 mg PRN q 4 hours for heart racing.   Secondary Hypercoagulable State (ICD10:  D68.69) The patient is at significant risk for stroke/thromboembolism based upon her CHA2DS2-VASc Score of 6.  Continue Apixaban  (Eliquis ). No bleeding issues.   HTN Stable on current regimen  OSA  Encouraged nightly CPAP  CAD No anginal symptoms Followed by Dr Lavona    Follow up in the AF clinic post DCCV.    Informed Consent   Shared Decision Making/Informed Consent The risks (stroke, cardiac arrhythmias rarely resulting in the need for a temporary or permanent pacemaker, skin irritation or burns and complications associated with conscious sedation including aspiration, arrhythmia, respiratory failure and death), benefits (restoration of normal sinus rhythm) and alternatives of a direct current cardioversion were explained in detail to Ms. Utecht and she agrees to proceed.       Daril Kicks PA-C Afib Clinic [facility] [address] [place], [place] [address] [phone] "

## 2024-04-09 ENCOUNTER — Encounter (HOSPITAL_COMMUNITY)
Admission: RE | Disposition: A | Discharge status: Home / Self Care | Payer: Self-pay | Source: Home / Self Care | Attending: Internal Medicine

## 2024-04-09 ENCOUNTER — Ambulatory Visit (HOSPITAL_COMMUNITY): Admitting: Anesthesiology

## 2024-04-09 ENCOUNTER — Ambulatory Visit (HOSPITAL_COMMUNITY): Payer: Self-pay | Admitting: Physician Assistant

## 2024-04-09 ENCOUNTER — Ambulatory Visit (HOSPITAL_COMMUNITY)
Admission: RE | Admit: 2024-04-09 | Discharge: 2024-04-09 | Disposition: A | Discharge status: Home / Self Care | Attending: Internal Medicine | Admitting: Internal Medicine

## 2024-04-09 ENCOUNTER — Other Ambulatory Visit: Payer: Self-pay

## 2024-04-09 ENCOUNTER — Encounter (HOSPITAL_COMMUNITY): Payer: Self-pay | Admitting: Internal Medicine

## 2024-04-09 DIAGNOSIS — I480 Paroxysmal atrial fibrillation: Secondary | ICD-10-CM

## 2024-04-09 DIAGNOSIS — I4891 Unspecified atrial fibrillation: Secondary | ICD-10-CM | POA: Diagnosis not present

## 2024-04-09 DIAGNOSIS — I2510 Atherosclerotic heart disease of native coronary artery without angina pectoris: Secondary | ICD-10-CM | POA: Insufficient documentation

## 2024-04-09 DIAGNOSIS — Z79899 Other long term (current) drug therapy: Secondary | ICD-10-CM | POA: Diagnosis not present

## 2024-04-09 DIAGNOSIS — E785 Hyperlipidemia, unspecified: Secondary | ICD-10-CM | POA: Insufficient documentation

## 2024-04-09 DIAGNOSIS — Z7901 Long term (current) use of anticoagulants: Secondary | ICD-10-CM | POA: Insufficient documentation

## 2024-04-09 DIAGNOSIS — I110 Hypertensive heart disease with heart failure: Secondary | ICD-10-CM | POA: Diagnosis not present

## 2024-04-09 DIAGNOSIS — G4733 Obstructive sleep apnea (adult) (pediatric): Secondary | ICD-10-CM | POA: Insufficient documentation

## 2024-04-09 DIAGNOSIS — D6869 Other thrombophilia: Secondary | ICD-10-CM | POA: Diagnosis not present

## 2024-04-09 DIAGNOSIS — I4819 Other persistent atrial fibrillation: Secondary | ICD-10-CM

## 2024-04-09 DIAGNOSIS — I10 Essential (primary) hypertension: Secondary | ICD-10-CM | POA: Insufficient documentation

## 2024-04-09 DIAGNOSIS — I5022 Chronic systolic (congestive) heart failure: Secondary | ICD-10-CM

## 2024-04-09 DIAGNOSIS — E039 Hypothyroidism, unspecified: Secondary | ICD-10-CM | POA: Diagnosis not present

## 2024-04-09 HISTORY — PX: CARDIOVERSION: EP1203

## 2024-04-09 LAB — CBC
Hematocrit: 35 % (ref 34.0–46.6)
Hemoglobin: 11.6 g/dL (ref 11.1–15.9)
MCH: 31.2 pg (ref 26.6–33.0)
MCHC: 33.1 g/dL (ref 31.5–35.7)
MCV: 94 fL (ref 79–97)
Platelets: 359 x10E3/uL (ref 150–450)
RBC: 3.72 x10E6/uL — ABNORMAL LOW (ref 3.77–5.28)
RDW: 13.9 % (ref 11.7–15.4)
WBC: 8.7 x10E3/uL (ref 3.4–10.8)

## 2024-04-09 LAB — BASIC METABOLIC PANEL WITH GFR
BUN/Creatinine Ratio: 28 (ref 12–28)
BUN: 24 mg/dL (ref 8–27)
CO2: 27 mmol/L (ref 20–29)
Calcium: 9.4 mg/dL (ref 8.7–10.3)
Chloride: 101 mmol/L (ref 96–106)
Creatinine, Ser: 0.85 mg/dL (ref 0.57–1.00)
Glucose: 109 mg/dL — ABNORMAL HIGH (ref 70–99)
Potassium: 4.6 mmol/L (ref 3.5–5.2)
Sodium: 138 mmol/L (ref 134–144)
eGFR: 68 mL/min/1.73

## 2024-04-09 MED ORDER — SODIUM CHLORIDE 0.9% FLUSH
INTRAVENOUS | Status: DC | PRN
  Administered 2024-04-09: 7 mL via INTRAVENOUS

## 2024-04-09 MED ORDER — PROPOFOL 10 MG/ML IV BOLUS
INTRAVENOUS | Status: DC | PRN
  Administered 2024-04-09: 50 mg via INTRAVENOUS

## 2024-04-09 MED ORDER — SODIUM CHLORIDE 0.9 % IV SOLN: INTRAVENOUS | Status: DC

## 2024-04-09 MED ORDER — LIDOCAINE 2% (20 MG/ML) 5 ML SYRINGE
INTRAMUSCULAR | Status: DC | PRN
  Administered 2024-04-09: 60 mg via INTRAVENOUS

## 2024-04-09 NOTE — Anesthesia Postprocedure Evaluation (Signed)
"   Anesthesia Post Note  Patient: Susan Bell  Procedure(s) Performed: CARDIOVERSION     Patient location during evaluation: Cath Lab Anesthesia Type: General Level of consciousness: awake and alert Pain management: pain level controlled Vital Signs Assessment: post-procedure vital signs reviewed and stable Respiratory status: spontaneous breathing, nonlabored ventilation, respiratory function stable and patient connected to nasal cannula oxygen Cardiovascular status: blood pressure returned to baseline and stable Postop Assessment: no apparent nausea or vomiting Anesthetic complications: no   No notable events documented.  Last Vitals:  Vitals:   04/09/24 1110 04/09/24 1120  BP: (!) 143/61 (!) 153/64  Pulse: 62 63  Resp: 10 (!) 9  Temp: 36.6 C   SpO2: 99% 98%    Last Pain:  Vitals:   04/09/24 1110  TempSrc: Oral  PainSc: 0-No pain                 [name]      "

## 2024-04-09 NOTE — Transfer of Care (Signed)
 Immediate Anesthesia Transfer of Care Note  Patient: Susan Bell  Procedure(s) Performed: CARDIOVERSION  Patient Location: PACU and Cath Lab  Anesthesia Type:MAC  Level of Consciousness: awake and alert   Airway & Oxygen Therapy: Patient Spontanous Breathing and Patient connected to nasal cannula oxygen  Post-op Assessment: Report given to RN and Post -op Vital signs reviewed and stable  Post vital signs: Reviewed and stable  Last Vitals:  Vitals Value Taken Time  BP 129/57 04/09/24 1033  Temp    Pulse 59 04/09/24 1033  Resp 18 04/09/24 1033  SpO2 99% 04/09/24 1033    Last Pain:  Vitals:   04/09/24 0915  TempSrc: Temporal         Complications: No notable events documented.

## 2024-04-09 NOTE — CV Procedure (Signed)
" ° ° °  CARDIOVERSION NOTE  Procedure: Electrical Cardioversion Indications:  Atrial Fibrillation  Procedure Details:  Consent: Risks of procedure as well as the alternatives and risks of each were explained to the (patient/caregiver).  Consent for procedure obtained.  Time Out: Verified patient identification, verified procedure, site/side was marked, verified correct patient position, special equipment/implants available, medications/allergies/relevent history reviewed, required imaging and test results available.  Performed  Patient placed on cardiac monitor, pulse oximetry, supplemental oxygen as necessary.  Sedation given: propofol  per anesthesia Pacer pads placed anterior and posterior chest.  Cardioverted 1 time(s).  Cardioverted at 300J biphasic.  Impression: Findings: Post procedure EKG shows: NSR Complications: None Patient did tolerate procedure well.  Plan: Successful DCCV with a single 300J biphasic shock to NSR.  Time Spent Directly with the Patient:  30 minutes   Vinie KYM Maxcy, MD, [facility], FNLA, FACP  [place]  [facility]  Medical Director of the [facility] Lipid [facility] &  Cardiovascular Risk Reduction Clinic Diplomate of the American Board of Clinical Lipidology Attending Cardiologist  Direct Dial: [phone]  Fax: [phone]  Website:  [email or web address] Vinie BROCKS Nicie Milan 04/09/2024, 10:32 AM     "

## 2024-04-09 NOTE — Anesthesia Preprocedure Evaluation (Signed)
"                                    Anesthesia Evaluation  Patient identified by MRN, date of birth, ID band Patient awake    Reviewed: Allergy & Precautions, H&P , NPO status , Patient's Chart, lab work & pertinent test results  Airway Mallampati: II   Neck ROM: full    Dental   Pulmonary sleep apnea    breath sounds clear to auscultation       Cardiovascular hypertension, + dysrhythmias Atrial Fibrillation  Rhythm:irregular Rate:Normal     Neuro/Psych    GI/Hepatic ,GERD  ,,  Endo/Other  Hypothyroidism    Renal/GU      Musculoskeletal  (+) Arthritis ,    Abdominal   Peds  Hematology   Anesthesia Other Findings   Reproductive/Obstetrics                              Anesthesia Physical Anesthesia Plan  ASA: 3  Anesthesia Plan: General   Post-op Pain Management:    Induction: Intravenous  PONV Risk Score and Plan: 3 and Propofol  infusion and Treatment may vary due to age or medical condition  Airway Management Planned: Nasal Cannula  Additional Equipment:   Intra-op Plan:   Post-operative Plan:   Informed Consent: I have reviewed the patients History and Physical, chart, labs and discussed the procedure including the risks, benefits and alternatives for the proposed anesthesia with the patient or authorized representative who has indicated his/her understanding and acceptance.     Dental advisory given  Plan Discussed with: CRNA, Anesthesiologist and Surgeon  Anesthesia Plan Comments:         Anesthesia Quick Evaluation  "

## 2024-04-09 NOTE — Interval H&P Note (Signed)
 History and Physical Interval Note:  04/09/2024 9:42 AM  Susan Bell  has presented today for surgery, with the diagnosis of AFIB.  The various methods of treatment have been discussed with the patient and family. After consideration of risks, benefits and other options for treatment, the patient has consented to  Procedures: CARDIOVERSION (N/A) as a surgical intervention.  The patient's history has been reviewed, patient examined, no change in status, stable for surgery.  I have reviewed the patient's chart and labs.  Questions were answered to the patient's satisfaction.     Vinie JAYSON Maxcy

## 2024-04-10 ENCOUNTER — Encounter (HOSPITAL_COMMUNITY): Payer: Self-pay | Admitting: Internal Medicine

## 2024-04-22 ENCOUNTER — Other Ambulatory Visit: Payer: Self-pay | Admitting: Emergency Medicine

## 2024-04-24 ENCOUNTER — Ambulatory Visit (HOSPITAL_COMMUNITY)
Admission: RE | Admit: 2024-04-24 | Discharge: 2024-04-24 | Disposition: A | Discharge status: Home / Self Care | Source: Ambulatory Visit | Attending: Physician Assistant | Admitting: Physician Assistant

## 2024-04-24 VITALS — BP 168/56 | HR 53 | Ht 62.0 in | Wt 152.8 lb

## 2024-04-24 DIAGNOSIS — D6869 Other thrombophilia: Secondary | ICD-10-CM | POA: Insufficient documentation

## 2024-04-24 DIAGNOSIS — I4891 Unspecified atrial fibrillation: Secondary | ICD-10-CM | POA: Insufficient documentation

## 2024-04-24 DIAGNOSIS — I10 Essential (primary) hypertension: Secondary | ICD-10-CM | POA: Diagnosis not present

## 2024-04-24 DIAGNOSIS — I4819 Other persistent atrial fibrillation: Secondary | ICD-10-CM | POA: Insufficient documentation

## 2024-04-24 NOTE — Progress Notes (Signed)
 "  Primary Care Physician: Onita Rush, MD Referring Physician: [facility] f/u Cardiologist-  Dr.  Lavona  EP: Dr. Inocencio Gaetana [name] is a 84 y.o. female with a h/o HLD, hypothyroidism  that was successfully cardioverted in the [facility] ER 10/26/17 for new onset Afib. She returned to  the afib office 8/2 but had gone back into  afib and was unaware. She was started on BB for better rate control.   She denied tobacco, alcohol , excessive caffeine. Had been told she snored and appeared to stop breathing at night. Sleep study done 8/30 showed OSA. CPAP started. She had an echo that showed EF at 35% with diffuse hypokinesis. Lexi myoview  did not show any ischemia but was an intermediate test.  She was already on an ARB/BB.   She remained in afib, rate controlled. She denied any exertional chest pain, is fatigued and has noted shortness of breath more so with steps. She was admitted for Tikosyn .  However after discharge qt prolonged and Tikosyn  was stopped.   Since then she has had a long period of time without any afib until this past Monday. No  specific trigger but afib has been persistent since then. She feels some nausea and fatigue while in afib. Afib in the 55 's today on EKG. Echo in 2020 showed normalization of EF.   F/u afib clinic 11/14/19, following successful cardioversion. ekg today shows  Sinus brady . She feels well, improved in SR. She has had her covid shots a while back.   F/u in afib clinic, 01/02/20. She called to the office last week c/o left leg swelling since August. Rt leg no swelling. Reduced her amlodipine  for a few days with mild improvement but still swelling. I scheduled her for an Venous U/S this am which was negative for DVT. Minimal swelling this am. Usually  gets more swelling  during the day and down by the next am. She does wear intermittent support socks. Tries to minimize salt. No pain  associated with the swelling. 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 The U/S   technician mentioned to her that she could see evidence of  incompetent veins.   BP elevated on presentation this am, usually well controlled. On recheck improved to 150/80. She will check again when she gets home.  F/u in afib clinic, 12/15,  for return of afib. Would like  to be cardioverted. No missed anticoagulation. She is in afib in the 90's.  F/u afib clinic, 03/31/20, failed cardioversion, despite multiple shocks. We discussed antiarrythmic's. She has failed tikoyn in the past. She has h/o HF and IVCD so flecainide/Multaq is not an option.   So dicussed amiodarone  with pt as it appears to be her only option. I discussed this could be short term bridge  to ablation in the near future as she appears to be a good candidate. But she runs fast in afib so feel she will be better served to get her back in rhythm short term with amiodarone . She is in agreement.   F/u in afib clinic, 04/30/20. She has now been loading on amiodarone  x one month so will proceed with cardioversion to see if SR can be restored. Pt is in agreement. No missed doses of anticoagulation.   F/u in afib clinic, 05/18/20. She did have successful cardioversion 05/11/20 and ekg today shows SR.   F/u in afib clinic, 07/22/20. She has been staying in SR with amiodarone  but reports that her  PCP is concerned as her TSH is staying elevated. We discussed reducing amiodarone   to 100 mg daily and referring for an ablation to see if amiodarone  can be d/c after that.   She is now in the afib clinic, 10/14/20 for f/u ablation over concern for her thyroid  status and wanting to stop amiodarone . She is in Sinus brady  today. She reports no swallowing or groin issues. Her BP was elevated in the hospital post procedure and is elevated today on arrival and f/u.   Follow up in the AF clinic 09/01/22. Patient reports that she has been in afib since 5/20 with elevated rates. She was started on PRN diltiazem  and increased her daily metoprolol  which had  converted her in the past. She remains in rapid afib today. There were no specific triggers that she could identify.   Follow up in the AF clinic 09/19/22. Patient is s/p DCCV on 09/06/22. Patient reports that her energy is slowly returning post DCCV. She remains in SR today. No bleeding issues on anticoagulation.   Follow up in the AF clinic 11/02/22. Patient reports that she went back into afib on 7/22. She increased her BB to BID and has been using PRN diltiazem . There were no specific triggers that she could identify.   Follow up 07/11/23. Patient returns for follow up for atrial fibrillation. Patient is s/p afib and flutter ablation with Dr Inocencio on 06/13/23. Patient denies any interim symptoms of afib. She denies chest pain or groin issues. She remains in SR and has more energy.   Follow up 04/08/24. Patient returns for follow up for atrial fibrillation. She reports that she went into afib on 04/03/24. There were no specific triggers that she could identify. No bleeding issues on anticoagulation. She has symptoms of fatigue with exertion.   Follow up 04/24/24. Patient returns for follow up for atrial fibrillation. She is s/p DCCV on 04/09/24. She remains in SR today and feels well. No bleeding issues on anticoagulation.   Today, she  denies symptoms of palpitations, chest pain, shortness of breath, orthopnea, PND, lower extremity edema, dizziness, presyncope, syncope, snoring, daytime somnolence, bleeding, or neurologic sequela. The patient is tolerating medications without difficulties and is otherwise without complaint today.    Past Medical History:  Diagnosis Date   Allergy    seasonal   Anemia    Arthritis    Cataract    bilateral removed   Celiac disease    Eczema    H/O transfusion of platelets    Hyperlipidemia    Hypothyroidism    OSA (obstructive sleep apnea)    Osteopenia    Paroxysmal atrial fibrillation (HCC)    Shingles    Vitamin D  deficiency     ROS- All systems are  reviewed and negative except as per the HPI above  Physical Exam: Vitals:   04/24/24 1125  BP: (!) 168/56  Pulse: (!) 53  Weight: 69.3 kg  Height: 5' 2 (1.575 m)    Wt Readings from Last 3 Encounters:  04/24/24 69.3 kg  04/08/24 68.9 kg  01/31/24 69.4 kg    GEN: Well nourished, well developed in no acute distress CARDIAC: Regular rate and rhythm, no murmurs, rubs, gallops RESPIRATORY:  Clear to auscultation without rales, wheezing or rhonchi  ABDOMEN: Soft, non-tender, non-distended EXTREMITIES:  No edema; No deformity    EKG Interpretation Date/Time:  Wednesday April 24 2024 11:34:27 EST Ventricular Rate:  53 PR Interval:  226 QRS Duration:  138 QT Interval:  444 QTC Calculation: 416 R Axis:   -15  Text Interpretation: Sinus bradycardia with 1st degree A-V block Left ventricular hypertrophy with QRS widening and repolarization abnormality ( Cornell product ) Abnormal ECG When compared with ECG of 09-Apr-2024 10:37, Inverted T waves have replaced nonspecific T wave abnormality in Lateral leads Confirmed by Gabrial Poppell ([id number]) on 04/24/2024 11:49:27 AM    Echo- 10/24/18  1. The left ventricle has normal systolic function with an ejection fraction of 60-65%. The cavity size was normal. There is mild concentric  left ventricular hypertrophy. Left ventricular diastolic Doppler  parameters are consistent with pseudonormalization. Elevated mean left atrial pressure.   2. The right ventricle has normal systolic function. The cavity was  normal. There is no increase in right ventricular wall thickness. Right  ventricular systolic pressure is normal with an estimated pressure of 36.9  mmHg.   3. Left atrial size was mildly dilated.   4. There is mild to moderate mitral annular calcification present.   5. The aorta is normal in size and structure.     CHA2DS2-VASc Score = 6  The patient's score is based upon: CHF History: 1 HTN History: 1 Diabetes History: 0 Stroke  History: 0 Vascular Disease History: 1 Age Score: 2 Gender Score: 1       ASSESSMENT AND PLAN: Persistent Atrial Fibrillation (ICD10:  I48.19) The patient's CHA2DS2-VASc score is 6, indicating a 9.7% annual risk of stroke.   Previously failed dofetilide  with QT prolongation. Amiodarone  discontinued due to thyroid  dysfunction.  S/p afib ablation 09/16/20, afib and flutter ablation 06/13/23 S/p DCCV 04/09/24 She remains in SR today and feels well.  Continue Eliquis  5 mg BID Continue Toprol  25 mg daily Continue diltiazem  30 mg PRN q 4 hours for heart racing.   Secondary Hypercoagulable State (ICD10:  D68.69) The patient is at significant risk for stroke/thromboembolism based upon her CHA2DS2-VASc Score of 6.  Continue Apixaban  (Eliquis ). No bleeding issues.   HTN Elevated today. Patient will keep BP log at home for review.  Historically her BP readings at home are better than in office.   OSA  Encouraged nightly CPAP  CAD No anginal symptoms Followed by Dr Lavona    Follow up in the AF clinic in 6 months.    Daril Kicks PA-C [facility] [address] [place], [place] [address] [phone] "

## 2024-05-07 ENCOUNTER — Other Ambulatory Visit (HOSPITAL_COMMUNITY): Payer: Self-pay | Admitting: Physician Assistant

## 2024-05-07 DIAGNOSIS — I4819 Other persistent atrial fibrillation: Secondary | ICD-10-CM

## 2024-07-09 ENCOUNTER — Ambulatory Visit: Admitting: Cardiology

## 2024-10-24 ENCOUNTER — Ambulatory Visit (HOSPITAL_COMMUNITY): Admitting: Physician Assistant
